# Patient Record
Sex: Female | Born: 1960 | Race: White | Hispanic: No | Marital: Married | Smoking: Current some day smoker
Health system: Southern US, Community
[De-identification: ages and names within clinical notes are randomized; demographics above are authoritative.]

## PROBLEM LIST (undated history)

## (undated) DIAGNOSIS — F32A Depression, unspecified: Secondary | ICD-10-CM

## (undated) DIAGNOSIS — M1990 Unspecified osteoarthritis, unspecified site: Secondary | ICD-10-CM

## (undated) DIAGNOSIS — T7840XA Allergy, unspecified, initial encounter: Secondary | ICD-10-CM

## (undated) DIAGNOSIS — Z01419 Encounter for gynecological examination (general) (routine) without abnormal findings: Secondary | ICD-10-CM

## (undated) DIAGNOSIS — G4700 Insomnia, unspecified: Secondary | ICD-10-CM

## (undated) DIAGNOSIS — K589 Irritable bowel syndrome without diarrhea: Secondary | ICD-10-CM

## (undated) DIAGNOSIS — F329 Major depressive disorder, single episode, unspecified: Secondary | ICD-10-CM

## (undated) DIAGNOSIS — M0600 Rheumatoid arthritis without rheumatoid factor, unspecified site: Secondary | ICD-10-CM

## (undated) HISTORY — PX: FOOT SURGERY: SHX648

## (undated) HISTORY — DX: Unspecified osteoarthritis, unspecified site: M19.90

## (undated) HISTORY — PX: MEDIAL PARTIAL KNEE REPLACEMENT: SHX5965

## (undated) HISTORY — DX: Irritable bowel syndrome, unspecified: K58.9

## (undated) HISTORY — DX: Depression, unspecified: F32.A

## (undated) HISTORY — PX: OTHER SURGICAL HISTORY: SHX169

## (undated) HISTORY — DX: Encounter for gynecological examination (general) (routine) without abnormal findings: Z01.419

## (undated) HISTORY — DX: Insomnia, unspecified: G47.00

## (undated) HISTORY — DX: Allergy, unspecified, initial encounter: T78.40XA

## (undated) HISTORY — PX: HERNIA REPAIR: SHX51

## (undated) HISTORY — DX: Rheumatoid arthritis without rheumatoid factor, unspecified site: M06.00

## (undated) HISTORY — DX: Major depressive disorder, single episode, unspecified: F32.9

## (undated) HISTORY — PX: DILATION AND CURETTAGE OF UTERUS: SHX78

---

## 1991-09-21 HISTORY — PX: TEMPOROMANDIBULAR JOINT SURGERY: SHX35

## 1998-09-29 ENCOUNTER — Inpatient Hospital Stay (HOSPITAL_COMMUNITY): Admission: AD | Admit: 1998-09-29 | Discharge: 1998-10-02 | Payer: Self-pay | Admitting: Obstetrics and Gynecology

## 1998-11-07 ENCOUNTER — Ambulatory Visit (HOSPITAL_COMMUNITY): Admission: RE | Admit: 1998-11-07 | Discharge: 1998-11-07 | Payer: Self-pay | Admitting: Obstetrics and Gynecology

## 1998-11-24 ENCOUNTER — Other Ambulatory Visit: Admission: RE | Admit: 1998-11-24 | Discharge: 1998-11-24 | Payer: Self-pay | Admitting: Pediatrics

## 2000-01-25 ENCOUNTER — Other Ambulatory Visit: Admission: RE | Admit: 2000-01-25 | Discharge: 2000-01-25 | Payer: Self-pay | Admitting: Obstetrics and Gynecology

## 2001-04-05 ENCOUNTER — Other Ambulatory Visit: Admission: RE | Admit: 2001-04-05 | Discharge: 2001-04-05 | Payer: Self-pay | Admitting: Obstetrics and Gynecology

## 2002-04-30 ENCOUNTER — Other Ambulatory Visit: Admission: RE | Admit: 2002-04-30 | Discharge: 2002-04-30 | Payer: Self-pay | Admitting: Obstetrics and Gynecology

## 2003-06-10 ENCOUNTER — Other Ambulatory Visit: Admission: RE | Admit: 2003-06-10 | Discharge: 2003-06-10 | Payer: Self-pay | Admitting: Obstetrics and Gynecology

## 2004-06-10 ENCOUNTER — Other Ambulatory Visit: Admission: RE | Admit: 2004-06-10 | Discharge: 2004-06-10 | Payer: Self-pay | Admitting: Obstetrics and Gynecology

## 2005-07-30 ENCOUNTER — Other Ambulatory Visit: Admission: RE | Admit: 2005-07-30 | Discharge: 2005-07-30 | Payer: Self-pay | Admitting: Obstetrics and Gynecology

## 2007-04-29 ENCOUNTER — Encounter: Payer: Self-pay | Admitting: Family Medicine

## 2007-05-29 ENCOUNTER — Ambulatory Visit: Payer: Self-pay | Admitting: Family Medicine

## 2007-05-29 DIAGNOSIS — M810 Age-related osteoporosis without current pathological fracture: Secondary | ICD-10-CM | POA: Insufficient documentation

## 2007-05-29 DIAGNOSIS — Z9189 Other specified personal risk factors, not elsewhere classified: Secondary | ICD-10-CM

## 2007-05-29 DIAGNOSIS — F418 Other specified anxiety disorders: Secondary | ICD-10-CM | POA: Insufficient documentation

## 2007-05-29 DIAGNOSIS — J309 Allergic rhinitis, unspecified: Secondary | ICD-10-CM

## 2007-05-29 DIAGNOSIS — G4700 Insomnia, unspecified: Secondary | ICD-10-CM | POA: Insufficient documentation

## 2007-06-16 ENCOUNTER — Ambulatory Visit: Payer: Self-pay | Admitting: Family Medicine

## 2007-06-16 DIAGNOSIS — J019 Acute sinusitis, unspecified: Secondary | ICD-10-CM

## 2007-07-07 ENCOUNTER — Ambulatory Visit: Payer: Self-pay | Admitting: Family Medicine

## 2007-10-20 ENCOUNTER — Encounter: Admission: RE | Admit: 2007-10-20 | Discharge: 2007-10-20 | Payer: Self-pay | Admitting: Obstetrics and Gynecology

## 2007-11-09 ENCOUNTER — Ambulatory Visit: Payer: Self-pay | Admitting: Family Medicine

## 2007-11-09 LAB — CONVERTED CEMR LAB
Bilirubin Urine: NEGATIVE
Blood in Urine, dipstick: NEGATIVE
Glucose, Urine, Semiquant: NEGATIVE
Ketones, urine, test strip: NEGATIVE
Nitrite: NEGATIVE
Specific Gravity, Urine: 1.025
Urobilinogen, UA: 0.2
WBC Urine, dipstick: NEGATIVE
pH: 7.5

## 2007-11-10 LAB — CONVERTED CEMR LAB
ALT: 21 units/L (ref 0–35)
AST: 24 units/L (ref 0–37)
Albumin: 4.2 g/dL (ref 3.5–5.2)
Alkaline Phosphatase: 59 units/L (ref 39–117)
BUN: 11 mg/dL (ref 6–23)
Basophils Absolute: 0 10*3/uL (ref 0.0–0.1)
Basophils Relative: 0.6 % (ref 0.0–1.0)
Bilirubin, Direct: 0.1 mg/dL (ref 0.0–0.3)
CO2: 28 meq/L (ref 19–32)
Calcium: 10 mg/dL (ref 8.4–10.5)
Chloride: 101 meq/L (ref 96–112)
Cholesterol: 201 mg/dL (ref 0–200)
Creatinine, Ser: 0.9 mg/dL (ref 0.4–1.2)
Direct LDL: 96.1 mg/dL
Eosinophils Absolute: 0.1 10*3/uL (ref 0.0–0.6)
Eosinophils Relative: 2.6 % (ref 0.0–5.0)
GFR calc Af Amer: 87 mL/min
GFR calc non Af Amer: 72 mL/min
Glucose, Bld: 89 mg/dL (ref 70–99)
HCT: 44 % (ref 36.0–46.0)
HDL: 81.2 mg/dL (ref >39.0)
Hemoglobin: 14.1 g/dL (ref 12.0–15.0)
Lymphocytes Relative: 32.2 % (ref 12.0–46.0)
MCHC: 32.1 g/dL (ref 30.0–36.0)
MCV: 93.2 fL (ref 78.0–100.0)
Monocytes Absolute: 0.4 10*3/uL (ref 0.2–0.7)
Monocytes Relative: 8.1 % (ref 3.0–11.0)
Neutro Abs: 2.8 10*3/uL (ref 1.4–7.7)
Neutrophils Relative %: 56.5 % (ref 43.0–77.0)
Platelets: 193 10*3/uL (ref 150–400)
Potassium: 5 meq/L (ref 3.5–5.1)
RBC: 4.72 M/uL (ref 3.87–5.11)
RDW: 12.1 % (ref 11.5–14.6)
Sodium: 137 meq/L (ref 135–145)
TSH: 2.11 microintl units/mL (ref 0.35–5.50)
Total Bilirubin: 0.7 mg/dL (ref 0.3–1.2)
Total CHOL/HDL Ratio: 2.5
Total Protein: 6.7 g/dL (ref 6.0–8.3)
Triglycerides: 75 mg/dL (ref 0–149)
VLDL: 15 mg/dL (ref 0–40)
WBC: 4.8 10*3/uL (ref 4.5–10.5)

## 2007-11-16 ENCOUNTER — Ambulatory Visit: Payer: Self-pay | Admitting: Family Medicine

## 2007-12-04 ENCOUNTER — Encounter (INDEPENDENT_AMBULATORY_CARE_PROVIDER_SITE_OTHER): Payer: Self-pay | Admitting: Orthopedic Surgery

## 2007-12-04 ENCOUNTER — Ambulatory Visit (HOSPITAL_BASED_OUTPATIENT_CLINIC_OR_DEPARTMENT_OTHER): Admission: RE | Admit: 2007-12-04 | Discharge: 2007-12-04 | Payer: Self-pay | Admitting: Orthopedic Surgery

## 2008-01-15 ENCOUNTER — Encounter: Payer: Self-pay | Admitting: Family Medicine

## 2008-03-18 ENCOUNTER — Telehealth: Payer: Self-pay | Admitting: Family Medicine

## 2008-03-28 ENCOUNTER — Telehealth: Payer: Self-pay | Admitting: Family Medicine

## 2008-05-08 ENCOUNTER — Telehealth: Payer: Self-pay | Admitting: Family Medicine

## 2008-05-20 ENCOUNTER — Telehealth: Payer: Self-pay | Admitting: Family Medicine

## 2008-07-30 ENCOUNTER — Telehealth: Payer: Self-pay | Admitting: Family Medicine

## 2009-01-01 ENCOUNTER — Encounter: Payer: Self-pay | Admitting: Family Medicine

## 2009-01-22 ENCOUNTER — Ambulatory Visit: Payer: Self-pay | Admitting: Family Medicine

## 2009-01-22 LAB — CONVERTED CEMR LAB
Bilirubin Urine: NEGATIVE
Blood in Urine, dipstick: NEGATIVE
Glucose, Urine, Semiquant: NEGATIVE
Ketones, urine, test strip: NEGATIVE
Nitrite: NEGATIVE
Protein, U semiquant: NEGATIVE
Specific Gravity, Urine: <1.005
Urobilinogen, UA: 0.2
WBC Urine, dipstick: NEGATIVE
pH: 5.5

## 2009-01-24 LAB — CONVERTED CEMR LAB
ALT: 15 units/L (ref 0–35)
AST: 25 units/L (ref 0–37)
Albumin: 4.1 g/dL (ref 3.5–5.2)
Alkaline Phosphatase: 89 units/L (ref 39–117)
BUN: 13 mg/dL (ref 6–23)
Basophils Absolute: 0.1 10*3/uL (ref 0.0–0.1)
Basophils Relative: 1.1 % (ref 0.0–3.0)
Bilirubin, Direct: 0 mg/dL (ref 0.0–0.3)
CO2: 31 meq/L (ref 19–32)
Calcium: 9.7 mg/dL (ref 8.4–10.5)
Chloride: 106 meq/L (ref 96–112)
Cholesterol: 231 mg/dL — ABNORMAL HIGH (ref 0–200)
Creatinine, Ser: 0.9 mg/dL (ref 0.4–1.2)
Direct LDL: 135.1 mg/dL
Eosinophils Absolute: 0.2 10*3/uL (ref 0.0–0.7)
Eosinophils Relative: 3.5 % (ref 0.0–5.0)
GFR calc non Af Amer: 71.82 mL/min (ref >60)
Glucose, Bld: 81 mg/dL (ref 70–99)
HCT: 40.2 % (ref 36.0–46.0)
HDL: 78.7 mg/dL (ref >39.00)
Hemoglobin: 13.9 g/dL (ref 12.0–15.0)
Lymphocytes Relative: 31.3 % (ref 12.0–46.0)
Lymphs Abs: 1.6 10*3/uL (ref 0.7–4.0)
MCHC: 34.6 g/dL (ref 30.0–36.0)
MCV: 90.8 fL (ref 78.0–100.0)
Monocytes Absolute: 0.5 10*3/uL (ref 0.1–1.0)
Monocytes Relative: 9.1 % (ref 3.0–12.0)
Neutro Abs: 2.6 10*3/uL (ref 1.4–7.7)
Neutrophils Relative %: 55 % (ref 43.0–77.0)
Platelets: 192 10*3/uL (ref 150.0–400.0)
Potassium: 4.1 meq/L (ref 3.5–5.1)
RBC: 4.42 M/uL (ref 3.87–5.11)
RDW: 12 % (ref 11.5–14.6)
Sodium: 145 meq/L (ref 135–145)
TSH: 2.8 microintl units/mL (ref 0.35–5.50)
Total Bilirubin: 0.7 mg/dL (ref 0.3–1.2)
Total CHOL/HDL Ratio: 3
Total Protein: 7.4 g/dL (ref 6.0–8.3)
Triglycerides: 93 mg/dL (ref 0.0–149.0)
VLDL: 18.6 mg/dL (ref 0.0–40.0)
WBC: 5 10*3/uL (ref 4.5–10.5)

## 2009-01-30 ENCOUNTER — Ambulatory Visit: Payer: Self-pay | Admitting: Family Medicine

## 2009-02-28 ENCOUNTER — Telehealth: Payer: Self-pay | Admitting: Family Medicine

## 2009-07-16 ENCOUNTER — Encounter: Payer: Self-pay | Admitting: Family Medicine

## 2009-08-04 ENCOUNTER — Telehealth: Payer: Self-pay | Admitting: Family Medicine

## 2009-09-20 LAB — HM MAMMOGRAPHY: HM Mammogram: NORMAL

## 2010-03-06 ENCOUNTER — Encounter (INDEPENDENT_AMBULATORY_CARE_PROVIDER_SITE_OTHER): Payer: Self-pay | Admitting: Emergency Medicine

## 2010-03-06 ENCOUNTER — Emergency Department (HOSPITAL_COMMUNITY): Admission: EM | Admit: 2010-03-06 | Discharge: 2010-03-06 | Payer: Self-pay | Admitting: Emergency Medicine

## 2010-03-06 ENCOUNTER — Ambulatory Visit: Payer: Self-pay | Admitting: Vascular Surgery

## 2010-03-06 ENCOUNTER — Encounter: Payer: Self-pay | Admitting: Family Medicine

## 2010-03-09 ENCOUNTER — Ambulatory Visit: Payer: Self-pay | Admitting: Family Medicine

## 2010-03-09 DIAGNOSIS — R4701 Aphasia: Secondary | ICD-10-CM | POA: Insufficient documentation

## 2010-03-09 DIAGNOSIS — G43809 Other migraine, not intractable, without status migrainosus: Secondary | ICD-10-CM | POA: Insufficient documentation

## 2010-04-01 ENCOUNTER — Telehealth: Payer: Self-pay | Admitting: Family Medicine

## 2010-05-02 ENCOUNTER — Ambulatory Visit: Payer: Self-pay | Admitting: Family Medicine

## 2010-05-02 DIAGNOSIS — IMO0001 Reserved for inherently not codable concepts without codable children: Secondary | ICD-10-CM

## 2010-05-11 ENCOUNTER — Ambulatory Visit: Payer: Self-pay | Admitting: Family Medicine

## 2010-05-11 LAB — CONVERTED CEMR LAB
Bilirubin Urine: NEGATIVE
Blood in Urine, dipstick: NEGATIVE
Glucose, Urine, Semiquant: NEGATIVE
Ketones, urine, test strip: NEGATIVE
Nitrite: NEGATIVE
Protein, U semiquant: NEGATIVE
Specific Gravity, Urine: 1.025
Urobilinogen, UA: 0.2
pH: 5.5

## 2010-05-13 LAB — CONVERTED CEMR LAB
ALT: 21 units/L (ref 0–35)
AST: 28 units/L (ref 0–37)
Albumin: 4.1 g/dL (ref 3.5–5.2)
Alkaline Phosphatase: 87 units/L (ref 39–117)
BUN: 17 mg/dL (ref 6–23)
Basophils Absolute: 0 10*3/uL (ref 0.0–0.1)
Basophils Relative: 0.5 % (ref 0.0–3.0)
Bilirubin, Direct: 0.1 mg/dL (ref 0.0–0.3)
CO2: 29 meq/L (ref 19–32)
Calcium: 9.4 mg/dL (ref 8.4–10.5)
Chloride: 103 meq/L (ref 96–112)
Cholesterol: 232 mg/dL — ABNORMAL HIGH (ref 0–200)
Creatinine, Ser: 0.8 mg/dL (ref 0.4–1.2)
Direct LDL: 139.3 mg/dL
Eosinophils Absolute: 0.1 10*3/uL (ref 0.0–0.7)
Eosinophils Relative: 1.8 % (ref 0.0–5.0)
GFR calc non Af Amer: 81.81 mL/min (ref >60)
Glucose, Bld: 78 mg/dL (ref 70–99)
HCT: 38.7 % (ref 36.0–46.0)
HDL: 80.9 mg/dL (ref >39.00)
Hemoglobin: 13 g/dL (ref 12.0–15.0)
Lymphocytes Relative: 30.6 % (ref 12.0–46.0)
Lymphs Abs: 1.7 10*3/uL (ref 0.7–4.0)
MCHC: 33.6 g/dL (ref 30.0–36.0)
MCV: 88 fL (ref 78.0–100.0)
Monocytes Absolute: 0.4 10*3/uL (ref 0.1–1.0)
Monocytes Relative: 7.4 % (ref 3.0–12.0)
Neutro Abs: 3.3 10*3/uL (ref 1.4–7.7)
Neutrophils Relative %: 59.7 % (ref 43.0–77.0)
Platelets: 195 10*3/uL (ref 150.0–400.0)
Potassium: 4 meq/L (ref 3.5–5.1)
RBC: 4.4 M/uL (ref 3.87–5.11)
RDW: 14.9 % — ABNORMAL HIGH (ref 11.5–14.6)
Sodium: 140 meq/L (ref 135–145)
TSH: 4.15 microintl units/mL (ref 0.35–5.50)
Total Bilirubin: 0.5 mg/dL (ref 0.3–1.2)
Total CHOL/HDL Ratio: 3
Total Protein: 6.7 g/dL (ref 6.0–8.3)
Triglycerides: 82 mg/dL (ref 0.0–149.0)
VLDL: 16.4 mg/dL (ref 0.0–40.0)
WBC: 5.5 10*3/uL (ref 4.5–10.5)

## 2010-05-18 ENCOUNTER — Ambulatory Visit: Payer: Self-pay | Admitting: Family Medicine

## 2010-09-30 ENCOUNTER — Telehealth: Payer: Self-pay | Admitting: Family Medicine

## 2010-10-11 ENCOUNTER — Encounter: Payer: Self-pay | Admitting: Obstetrics and Gynecology

## 2010-10-16 ENCOUNTER — Telehealth: Payer: Self-pay | Admitting: Family Medicine

## 2010-10-22 NOTE — Progress Notes (Signed)
Summary: rx evista   Phone Note Call from Patient   Caller: Patient Call For: Nelwyn Salisbury MD Summary of Call: Needs Evista refilled.......states she and Dr. Clent Ridges talked about him prescribing all meds. [facility] Initial call taken by: Lynann Beaver CMA AAMA,  September 30, 2010 9:03 AM  Follow-up for Phone Call        call this in please, #30 with 11 rf  Follow-up by: Nelwyn Salisbury MD,  October 01, 2010 8:48 AM  Additional Follow-up for Phone Call Additional follow up Details #1::        done  Additional Follow-up by: Pura Spice, RN,  October 01, 2010 1:13 PM    New/Updated Medications: EVISTA 60 MG  TABS (RALOXIFENE HCL) 1 by mouth once daily Prescriptions: EVISTA 60 MG  TABS (RALOXIFENE HCL) 1 by mouth once daily  #30 x 11   Entered by:   Pura Spice, RN   Authorized by:   Nelwyn Salisbury MD   Signed by:   Pura Spice, RN on 10/01/2010   Method used:   Electronically to        [facility] (retail)       [address]       [place], [place]  [address]       Ph: [phone]       Fax: [phone]   RxID:   [id number]

## 2010-10-22 NOTE — Letter (Signed)
Summary: [facility]  [facility]   Imported By: Maryln Gottron 07/21/2009 14:48:32  _____________________________________________________________________  External Attachment:    Type:   Image     Comment:   External Document

## 2010-10-22 NOTE — Progress Notes (Signed)
Summary: rx temazepam  Phone Note From Pharmacy   Caller: [facility] Summary of Call: rx  temazepam  Initial call taken by: Pura Spice, RN,  October 16, 2010 4:07 PM  Follow-up for Phone Call        call in #30 with 5 rf Follow-up by: Nelwyn Salisbury MD,  October 16, 2010 5:00 PM  Additional Follow-up for Phone Call Additional follow up Details #1::        done  Additional Follow-up by: Pura Spice, RN,  October 16, 2010 5:13 PM    New/Updated Medications: RESTORIL 30 MG CAPS (TEMAZEPAM) 1 capsule at bedtime if needed Prescriptions: RESTORIL 30 MG CAPS (TEMAZEPAM) 1 capsule at bedtime if needed  #30 x 5   Entered by:   Pura Spice, RN   Authorized by:   Nelwyn Salisbury MD   Signed by:   Pura Spice, RN on 10/16/2010   Method used:   Printed then faxed to ...       [facility] (retail)       [address]       [place], [place]  [address]       Ph: [phone]       Fax: [phone]   RxID:   [id number]

## 2010-10-22 NOTE — Assessment & Plan Note (Signed)
Summary: seen in ER this weekend with slurred speech/had CT/MRI/dm   Vital Signs:  Patient profile:   50 year old female Weight:      133 pounds BP sitting:   82 / 58  (left arm) Cuff size:   regular  Vitals Entered By: Raechel Ache, RN (March 09, 2010 2:21 PM) CC: ER f/u for aphasia.    History of Present Illness: Here to follow up an episode of slurred speech. She had the onset of slurred speech and difficulty putting her thoughts into words on 03-06-10 after getting out of her car. She had some slight flashing lights and zigzag lines in her vision as well. No shaking or rigidity, no nausea or SOB or chest pains. She did have a mild left temporal HA with this. This lasted 10 minutes and went away. She was taken to the ER and had a full workup of labs, head CT, and brain MRI/MRA. All this was normal. She has felt fine ever since. They suggested she start taking a baby aspirin daily, and she has. She has a hx of migraines with visual auras, but she usually does not have much of a HA with these. She has been under a lot of stress lately with family issues.   Allergies: 1)  ! Vicodin  Past History:  Past Medical History: Reviewed history from 11/16/2007 and no changes required. Allergic rhinitis Depression Osteoporosis (last DEXA 2006) per Dr. Rana Snare insomnia                                                                                                                                                                      constipation (had a normal colonoscopy 10-05 per Dr. Kinnie Scales) normal treadmill and ECHO 4-08 per Dr. Jacinto Halim  Past Surgical History: Reviewed history from 05/29/2007 and no changes required. TMJ 1993 Hernia, umbilical Tummy Tuck (per Dr. Aurelio Jew)  Family History: Reviewed history from 05/29/2007 and no changes required. Family History of Alcoholism/Addiction Family History of Arthritis Family History of CAD Female 1st degree relative <60 Family History  Depression Family History Diabetes 1st degree relative Family History High cholesterol Family History of Stroke F 1st degree relative <60 Family History Thyroid disease sister has migraines  Review of Systems  The patient denies anorexia, fever, weight loss, weight gain, vision loss, decreased hearing, hoarseness, chest pain, syncope, dyspnea on exertion, peripheral edema, prolonged cough, headaches, hemoptysis, abdominal pain, melena, hematochezia, severe indigestion/heartburn, hematuria, incontinence, genital sores, muscle weakness, suspicious skin lesions, transient blindness, difficulty walking, depression, unusual weight change, abnormal bleeding, enlarged lymph nodes, angioedema, breast masses, and testicular masses.    Physical Exam  General:  Well-developed,well-nourished,in no acute distress; alert,appropriate and cooperative throughout examination Head:  Normocephalic and atraumatic without obvious  abnormalities. No apparent alopecia or balding. Eyes:  No corneal or conjunctival inflammation noted. EOMI. Perrla. Funduscopic exam benign, without hemorrhages, exudates or papilledema. Vision grossly normal. Ears:  External ear exam shows no significant lesions or deformities.  Otoscopic examination reveals clear canals, tympanic membranes are intact bilaterally without bulging, retraction, inflammation or discharge. Hearing is grossly normal bilaterally. Nose:  External nasal examination shows no deformity or inflammation. Nasal mucosa are pink and moist without lesions or exudates. Mouth:  Oral mucosa and oropharynx without lesions or exudates.  Teeth in good repair. Neck:  No deformities, masses, or tenderness noted. No carotid bruits Lungs:  Normal respiratory effort, chest expands symmetrically. Lungs are clear to auscultation, no crackles or wheezes. Heart:  Normal rate and regular rhythm. S1 and S2 normal without gallop, murmur, click, rub or other extra sounds. Neurologic:  No  cranial nerve deficits noted. Station and gait are normal. Plantar reflexes are down-going bilaterally. DTRs are symmetrical throughout. Sensory, motor and coordinative functions appear intact.   Impression & Recommendations:  Problem # 1:  MIGRAINE VARIANT (ICD-346.20)  Her updated medication list for this problem includes:    Aspirin 81 Mg Tbec (Aspirin) ..... Once daily  Problem # 2:  APHASIA (ICD-784.3)  Complete Medication List: 1)  Lexapro 20 Mg Tabs (Escitalopram oxalate) .Marland Kitchen.. 1 by mouth once daily 2)  Miralax Powd (Polyethylene glycol 3350) .... As needed 3)  Evista 60 Mg Tabs (Raloxifene hcl) .Marland Kitchen.. 1 by mouth once daily 4)  Alprazolam 1 Mg Tb24 (Alprazolam) .Marland Kitchen.. 1-2 q hs 5)  Temazepam 30 Mg Caps (Temazepam) .Marland Kitchen.. 1 at bedtime prn 6)  Aspirin 81 Mg Tbec (Aspirin) .... Once daily  Patient Instructions: 1)  I agree with baby aspirin daily. I do not think that further workup is needed at this point. She is to get plenty of sleep, drink lots of fluids, and avoid getting overheated. If she has another episode she will let us know.

## 2010-10-22 NOTE — Progress Notes (Signed)
Summary: not sleeping  Phone Note Call from Patient   Caller: Patient Call For: Dr. Clent Ridges Summary of Call: Pt is still not sleeping through the night on Temazepam 30 mg. Please call her and let her know what you suggest? [phone] Initial call taken by: Lynann Beaver CMA,  March 18, 2008 12:33 PM  Follow-up for Phone Call        call in Lunesta 3 mg at bedtime , #30 with 5 rf Follow-up by: Nelwyn Salisbury MD,  March 18, 2008 5:33 PM  Additional Follow-up for Phone Call Additional follow up Details #1::        Phone Call Completed, Rx Called In Additional Follow-up by: Alfred Levins, CMA,  March 18, 2008 5:35 PM    New/Updated Medications: LUNESTA 3 MG TABS (ESZOPICLONE) one by mouth at bedtime as needed   Prescriptions: LUNESTA 3 MG TABS (ESZOPICLONE) one by mouth at bedtime as needed  #30 x 5   Entered by:   Alfred Levins, CMA   Authorized by:   Nelwyn Salisbury MD   Signed by:   Alfred Levins, CMA on 03/18/2008   Method used:   Faxed to ...       [facility]       [address]       [place], [place]  [address]       Ph: [phone]       Fax: [phone]   RxID:   [id number]

## 2010-10-22 NOTE — Assessment & Plan Note (Signed)
Summary: COLD/COUGH/NJR   Vital Signs:  Patient Profile:   49 Years Old Female Height:     62.5 inches (158.75 cm) Weight:      116 pounds Temp:     98.1 degrees F oral Pulse rate:   83 / minute Pulse rhythm:   regular BP sitting:   112 / 76  (left arm)  Vitals Entered By: Alfred Levins, CMA (June 16, 2007 10:37 AM)                 Chief Complaint:  cough and nasal congestion x 1wk.  History of Present Illness: For one week has had sinus pressure, PND, ST, and dry cough. Took a Zpack she had at home with no results.  Current Allergies: ! VICODIN     Review of Systems      See HPI   Physical Exam  General:     Well-developed,well-nourished,in no acute distress; alert,appropriate and cooperative throughout examination Head:     Normocephalic and atraumatic without obvious abnormalities. No apparent alopecia or balding. Eyes:     No corneal or conjunctival inflammation noted. EOMI. Perrla. Funduscopic exam benign, without hemorrhages, exudates or papilledema. Vision grossly normal. Ears:     External ear exam shows no significant lesions or deformities.  Otoscopic examination reveals clear canals, tympanic membranes are intact bilaterally without bulging, retraction, inflammation or discharge. Hearing is grossly normal bilaterally. Nose:     External nasal examination shows no deformity or inflammation. Nasal mucosa are pink and moist without lesions or exudates. Mouth:     Oral mucosa and oropharynx without lesions or exudates.  Teeth in good repair. Neck:     No deformities, masses, or tenderness noted. Lungs:     Normal respiratory effort, chest expands symmetrically. Lungs are clear to auscultation, no crackles or wheezes.    Impression & Recommendations:  Problem # 1:  SINUSITIS, ACUTE NOS (ICD-461.9)  Her updated medication list for this problem includes:    Augmentin 875-125 Mg Tabs (Amoxicillin-pot clavulanate) .Marland Kitchen..Marland Kitchen Two times a day   Complete  Medication List: 1)  Actonel 35 Mg Tabs (Risedronate sodium) .Marland Kitchen.. 1 by mouth weekly 2)  Ambien 10 Mg Tabs (Zolpidem tartrate) .... As needed 3)  Lexapro 20 Mg Tabs (Escitalopram oxalate) .Marland Kitchen.. 1 by mouth once daily 4)  Miralax Powd (Polyethylene glycol 3350) .... As needed 5)  Augmentin 875-125 Mg Tabs (Amoxicillin-pot clavulanate) .... Two times a day   Patient Instructions: 1)  Please schedule a follow-up appointment as needed.    Prescriptions: AUGMENTIN 875-125 MG  TABS (AMOXICILLIN-POT CLAVULANATE) two times a day  #14 x 0   Entered and Authorized by:   Nelwyn Salisbury MD   Signed by:   Nelwyn Salisbury MD on 06/16/2007   Method used:   Electronically sent to ...       [facility]       [address]       [place], [place]  [address]       Ph: [phone]       Fax: [phone]   RxID:   [id number]  ]

## 2010-10-22 NOTE — Progress Notes (Signed)
Summary: refill temazepam  Phone Note From Pharmacy   Caller: [facility] Call For: Sarah Ford  Reason for Call: Needs renewal Summary of Call: refill temazepam 30mg  1 by mouth at bedtime as needed Initial call taken by: Alfred Levins, CMA,  July 30, 2008 8:21 AM  Follow-up for Phone Call        please check this out. As of August she was taking Alprazolam (Xanax) at bedtime, not Temazepam Follow-up by: Nelwyn Salisbury MD,  July 30, 2008 8:33 AM  Additional Follow-up for Phone Call Additional follow up Details #1::        according to pharmacist she is taking both since feb. temazepam  Additional Follow-up by: Alfred Levins, CMA,  August 01, 2008 11:29 AM    Additional Follow-up for Phone Call Additional follow up Details #2::    call in #30 of 30 mg at bedtime ,with 5 rf Follow-up by: Nelwyn Salisbury MD,  August 01, 2008 12:26 PM  Additional Follow-up for Phone Call Additional follow up Details #3:: Details for Additional Follow-up Action Taken: rx called in Additional Follow-up by: Alfred Levins, CMA,  August 01, 2008 12:41 PM  New/Updated Medications: TEMAZEPAM 30 MG CAPS (TEMAZEPAM) 1 at bedtime prn   Prescriptions: TEMAZEPAM 30 MG CAPS (TEMAZEPAM) 1 at bedtime prn  #30 x 5   Entered by:   Alfred Levins, CMA   Authorized by:   Nelwyn Salisbury MD   Signed by:   Alfred Levins, CMA on 08/01/2008   Method used:   Telephoned to ...       [facility] (retail)       [address]       [place], [place]  [address]       Ph: [phone]       Fax: [phone]   RxID:   [id number]

## 2010-10-22 NOTE — Assessment & Plan Note (Signed)
Summary: cpx/cjr   Vital Signs:  Patient profile:   50 year old female Weight:      136 pounds BMI:     24.76 BP sitting:   100 / 64  (left arm) Cuff size:   regular  Vitals Entered By: Raechel Ache, RN (May 18, 2010 9:21 AM) CC: CPX, labs done. Sees gyn.   History of Present Illness: 50 yr old female for a cpx. She is doing well in general. She finds Alprazolam better for falling asleep then temazepam, so she wants to switch back to this.   Allergies: 1)  ! Vicodin  Past History:  Past Medical History: Allergic rhinitis Depression Osteoporosis (last DEXA 2006) per Dr. Rana Snare insomnia                                                                                                                                                                      constipation normal treadmill and ECHO 4-08 per Dr. Jacinto Halim sees Dr/ Rana Snare for GYN exams  Past Surgical History: TMJ 1993 Hernia, umbilical Tummy Tuck (per Dr. Aurelio Jew) colonoscopy in 05-2009 per Dr. Kinnie Scales, normal, repeat in 5 yrs  Family History: Reviewed history from 03/09/2010 and no changes required. Family History of Alcoholism/Addiction Family History of Arthritis Family History of CAD Female 1st degree relative <60 Family History Depression Family History Diabetes 1st degree relative Family History High cholesterol Family History of Stroke F 1st degree relative <60 Family History Thyroid disease sister has migraines  Social History: Reviewed history from 05/29/2007 and no changes required. 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    Physical Exam  General:  Well-developed,well-nourished,in no acute distress; alert,appropriate and cooperative throughout examination Head:  Normocephalic and atraumatic without obvious abnormalities. No apparent alopecia or balding. Eyes:  No corneal or conjunctival inflammation noted. EOMI. Perrla. Funduscopic exam benign, without hemorrhages, exudates or papilledema. Vision grossly normal. Ears:  External ear exam shows no significant lesions or deformities.  Otoscopic examination reveals clear canals, tympanic membranes are intact bilaterally without bulging, retraction, inflammation or discharge. Hearing is grossly normal bilaterally. Nose:  External nasal examination shows no deformity or inflammation. Nasal mucosa are pink and moist without lesions or exudates. Mouth:  Oral mucosa and oropharynx without lesions or exudates.  Teeth in good repair. Neck:  No deformities, masses, or tenderness noted. Chest Wall:  No deformities, masses, or tenderness noted. Lungs:  Normal respiratory effort, chest expands symmetrically. Lungs are clear to auscultation, no crackles or wheezes. Heart:  Normal rate and regular rhythm. S1 and S2 normal without gallop, murmur, click, rub or other extra sounds. Abdomen:  Bowel sounds positive,abdomen soft and non-tender without masses, organomegaly or hernias noted. Msk:  No deformity or scoliosis noted of thoracic or lumbar spine.   Pulses:  R and L carotid,radial,femoral,dorsalis pedis and posterior tibial pulses are full and equal bilaterally Extremities:  No clubbing, cyanosis, edema, or deformity noted with normal full range of motion of all joints.   Neurologic:  No cranial nerve deficits noted. Station and gait are normal. Plantar reflexes are down-going bilaterally. DTRs are  symmetrical throughout. Sensory, motor and coordinative functions appear intact. Skin:  Intact without suspicious lesions or rashes Cervical Nodes:  No lymphadenopathy noted Axillary Nodes:  No palpable lymphadenopathy Inguinal Nodes:  No significant adenopathy Psych:  Cognition and judgment appear intact. Alert and cooperative with normal attention span and concentration. No apparent delusions, illusions, hallucinations   Impression & Recommendations:  Problem # 1:  PHYSICAL EXAMINATION (ICD-V70.0)  Complete Medication List: 1)  Lexapro 20 Mg Tabs (Escitalopram oxalate) .Marland Kitchen.. 1 by mouth once daily 2)  Miralax Powd (Polyethylene glycol 3350) .... As needed 3)  Evista 60 Mg Tabs (Raloxifene hcl) .Marland Kitchen.. 1 by mouth once daily 4)  Alprazolam 1 Mg Tb24 (Alprazolam) .Marland Kitchen.. 1-2 at bedtime 5)  Aspirin 81 Mg Tbec (Aspirin) .... Once daily 6)  Doxycycline Hyclate 100 Mg Tabs (Doxycycline hyclate) .Marland Kitchen.. 1 by mouth two times a day for 10 days  Patient Instructions: 1)  Switch to Alprazolam at bedtime.  2)  Please schedule a follow-up appointment in 6 months .  Prescriptions: ALPRAZOLAM 1 MG  TB24 (ALPRAZOLAM) 1-2 at bedtime  #60 x 5   Entered and Authorized by:   Nelwyn Salisbury MD   Signed by:   Nelwyn Salisbury MD on 05/18/2010   Method used:   Print then Give to Patient   RxID:   [id number] LEXAPRO 20 MG TABS (ESCITALOPRAM OXALATE) 1 by mouth once daily  #30 x 11   Entered and Authorized by:   Nelwyn Salisbury MD   Signed by:   Nelwyn Salisbury MD on 05/18/2010   Method used:   Print then Give to Patient   RxID:   [id number]    Preventive Care Screening  Mammogram:    Date:  09/20/2009    Results:  normal

## 2010-10-22 NOTE — Consult Note (Signed)
Summary: Dr Osie Bond note  Dr Osie Bond note   Imported By: Kassie Mends 01/19/2008 08:20:35  _____________________________________________________________________  External Attachment:    Type:   Image     Comment:   Dr Osie Bond note

## 2010-10-22 NOTE — Assessment & Plan Note (Signed)
Summary: sinus infection/ccm   Vital Signs:  Patient Profile:   50 Years Old Female Height:     62.5 inches (158.75 cm) Weight:      108.5 pounds Temp:     98.4 degrees F Pulse rate:   76 / minute BP sitting:   98 / 70  Vitals Entered By: Sindy Guadeloupe RN (July 07, 2007 4:14 PM)                 Chief Complaint:  sinus probs continue.  History of Present Illness: Has had a sinus infection for the past 3 weeks, despite a Zpack and then a course of Augmentin. Has sinus pressure, HA, PND, ST, blowing out green mucus from nose, and dry cough. On Claritin and fluids.  Current Allergies (reviewed today): ! VICODIN     Review of Systems      See HPI   Physical Exam  General:     Well-developed,well-nourished,in no acute distress; alert,appropriate and cooperative throughout examination Head:     Normocephalic and atraumatic without obvious abnormalities. No apparent alopecia or balding. Eyes:     No corneal or conjunctival inflammation noted. EOMI. Perrla. Funduscopic exam benign, without hemorrhages, exudates or papilledema. Vision grossly normal. Ears:     External ear exam shows no significant lesions or deformities.  Otoscopic examination reveals clear canals, tympanic membranes are intact bilaterally without bulging, retraction, inflammation or discharge. Hearing is grossly normal bilaterally. Nose:     External nasal examination shows no deformity or inflammation. Nasal mucosa are pink and moist without lesions or exudates. Mouth:     Oral mucosa and oropharynx without lesions or exudates.  Teeth in good repair. Neck:     No deformities, masses, or tenderness noted. Lungs:     Normal respiratory effort, chest expands symmetrically. Lungs are clear to auscultation, no crackles or wheezes.    Impression & Recommendations:  Problem # 1:  SINUSITIS, ACUTE NOS (ICD-461.9)  Her updated medication list for this problem includes:    Levaquin 750 Mg Tabs  (Levofloxacin) ..... Once daily   Complete Medication List: 1)  Actonel 35 Mg Tabs (Risedronate sodium) .Marland Kitchen.. 1 by mouth weekly 2)  Ambien 10 Mg Tabs (Zolpidem tartrate) .... As needed 3)  Lexapro 20 Mg Tabs (Escitalopram oxalate) .Marland Kitchen.. 1 by mouth once daily 4)  Miralax Powd (Polyethylene glycol 3350) .... As needed 5)  Caltrate 600+d 600-400 Mg-unit Tabs (Calcium carbonate-vitamin d) .... One or two once daily 6)  Levaquin 750 Mg Tabs (Levofloxacin) .... Once daily   Patient Instructions: 1)  stop claritin, try Mucinex D two times a day . 2)  Please schedule a follow-up appointment as needed.    Prescriptions: LEVAQUIN 750 MG  TABS (LEVOFLOXACIN) once daily  #14 x 0   Entered and Authorized by:   Nelwyn Salisbury MD   Signed by:   Nelwyn Salisbury MD on 07/07/2007   Method used:   Electronically sent to ...       [facility]       [address]       [place], [place]  [address]       Ph: [phone]       Fax: [phone]   RxID:   [id number]  ]  Influenza Immunization History:    Influenza # 1:  Historical (07/05/2007)

## 2010-10-22 NOTE — Letter (Signed)
Summary: [facility]  [facility]   Imported By: Maryln Gottron 01/06/2009 13:04:52  _____________________________________________________________________  External Attachment:    Type:   Image     Comment:   External Document

## 2010-10-22 NOTE — Assessment & Plan Note (Signed)
Summary: NEW PT TO EST/JNL   Vital Signs:  Patient Profile:   50 Years Old Female Height:     62.5 inches (158.75 cm) Weight:      116 pounds (52.73 kg) Temp:     98.2 degrees F (36.78 degrees C) oral Pulse rate:   72 / minute Pulse rhythm:   regular BP sitting:   112 / 72  (left arm)  Vitals Entered By: Alfred Levins, CMA (May 29, 2007 10:27 AM)                 Chief Complaint:  ESTABLISH.  History of Present Illness: 50 yr old female here to establish with [place]. In April was donating blood and was told she had an irregular heartbeat. Saw Dr. Jacinto Halim, who peformed a treadmill and an ECHO which were all normal. Told to follow up with a primary care doctor. She occasionally feels a slight flutter, but never SOB or chest pain. Is very active, walks 20 miles a week. Uses a fair amount of caffeine. Otherwise feels fine. Sees Dr. Rana Snare regularly for gyn care.  Current Allergies: ! VICODIN  Past Medical History:    Reviewed history and no changes required:       Allergic rhinitis       Depression       Osteoporosis (last DEXA 2006) per Dr. Rana Snare       insomnia                                                                                                                                                                      constipation (had a normal colonoscopy 10-05 per Dr. Kinnie Scales)  Past Surgical History:    Reviewed history and no changes required:       TMJ 1993       Hernia, umbilical       Tummy Tuck (per Dr. 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 1 by mouth once daily   Problem # 3:  OSTEOPOROSIS (ICD-733.00) Assessment: Unchanged  Her updated medication list for this problem includes:    Actonel 35 Mg Tabs (Risedronate sodium) .Marland Kitchen... 1 by mouth weekly   Problem # 4:  INSOMNIA, CHRONIC (ICD-307.42) Assessment: Unchanged  Complete Medication List: 1)  Actonel 35 Mg Tabs (Risedronate sodium) .Marland Kitchen.. 1 by mouth weekly 2)  Ambien 10 Mg Tabs (Zolpidem tartrate) .... As needed 3)  Lexapro 20 Mg Tabs (Escitalopram oxalate) .Marland Kitchen.. 1 by mouth once daily 4)  Miralax Powd (Polyethylene glycol 3350) .... As needed   Patient Instructions: 1)  Please schedule a follow-up appointment as needed. Get records of recent cardiac workup

## 2010-10-22 NOTE — Progress Notes (Signed)
Summary: refill temazepam  Phone Note From Pharmacy   Caller: [facility] [address] Call For: Taylor Levick  Summary of Call: refill temazepam 30mg  1 by mouth at bedtime Initial call taken by: Alfred Levins, CMA,  August 04, 2009 5:13 PM  Follow-up for Phone Call        call in #30 with 5 rf Follow-up by: Nelwyn Salisbury MD,  August 05, 2009 10:09 AM  Additional Follow-up for Phone Call Additional follow up Details #1::        Phone call completed, Pharmacist called Additional Follow-up by: Alfred Levins, CMA,  August 05, 2009 10:16 AM    Prescriptions: TEMAZEPAM 30 MG CAPS (TEMAZEPAM) 1 at bedtime prn  #30 x 5   Entered by:   Alfred Levins, CMA   Authorized by:   Nelwyn Salisbury MD   Signed by:   Alfred Levins, CMA on 08/05/2009   Method used:   Telephoned to ...       [facility] [address] (retail)       [address]       [place], [place]  [address]       Ph: [phone]       Fax: [phone]   RxID:   [id number]

## 2010-10-22 NOTE — Progress Notes (Signed)
Summary: refill Temazepam  Phone Note Refill Request Message from:  Fax from Pharmacy on April 01, 2010 11:27 AM  Refills Requested: Medication #1:  TEMAZEPAM 30 MG CAPS 1 at bedtime prn   Dosage confirmed as above?Dosage Confirmed   Supply Requested: 3 months  Method Requested: Fax to Local Pharmacy Initial call taken by: Raechel Ache, RN,  April 01, 2010 11:27 AM Caller: [facility] [address]  Follow-up for Phone Call        call in #30 with 5 rf Follow-up by: Nelwyn Salisbury MD,  April 01, 2010 6:02 PM  Additional Follow-up for Phone Call Additional follow up Details #1::        Rx faxed to pharmacy Additional Follow-up by: Raechel Ache, RN,  April 02, 2010 8:16 AM    Prescriptions: TEMAZEPAM 30 MG CAPS (TEMAZEPAM) 1 at bedtime prn  #30 x 5   Entered by:   Raechel Ache, RN   Authorized by:   Nelwyn Salisbury MD   Signed by:   Raechel Ache, RN on 04/02/2010   Method used:   Historical   RxID:   [id number]

## 2010-10-22 NOTE — Progress Notes (Signed)
Summary: refill hydrocodone  Phone Note From Pharmacy Call back at [phone]   Caller: [facility] [address] Call For: Sarah Ford  Summary of Call: refill hydrocodone Initial call taken by: Alfred Levins, CMA,  May 08, 2008 9:29 AM  Follow-up for Phone Call        (1) what is this for?, and (2) her chart says she is allergic to Vicodin? Follow-up by: Nelwyn Salisbury MD,  May 08, 2008 1:09 PM  Additional Follow-up for Phone Call Additional follow up Details #1::        I was mistaken it is xanax 1mg  Additional Follow-up by: Alfred Levins, CMA,  May 08, 2008 1:47 PM    Additional Follow-up for Phone Call Additional follow up Details #2::    ok, call in #60 with 5 rf Follow-up by: Nelwyn Salisbury MD,  May 08, 2008 1:48 PM  Additional Follow-up for Phone Call Additional follow up Details #3:: Details for Additional Follow-up Action Taken: rx called in Additional Follow-up by: Alfred Levins, CMA,  May 08, 2008 1:51 PM    Prescriptions: ALPRAZOLAM 1 MG  TB24 (ALPRAZOLAM) 1-2 q HS  #60 x 5   Entered by:   Alfred Levins, CMA   Authorized by:   Nelwyn Salisbury MD   Signed by:   Alfred Levins, CMA on 05/08/2008   Method used:   Faxed to ...       [facility]       [address]       [place], [place]  [address]       Ph: [phone]       Fax: [phone]   RxID:   [id number]

## 2010-10-22 NOTE — Letter (Signed)
Summary: medical records  medical records   Imported By: Kassie Mends 06/05/2007 12:48:20  _____________________________________________________________________  External Attachment:    Type:   Image     Comment:   medical records

## 2010-10-22 NOTE — Progress Notes (Signed)
Summary: Alfonso Patten giving her nightmares   [facility]TC[facility] 03/29/2008  Phone Note Call from Patient   Caller: Patient Call For: Dr. Clent Ridges Summary of Call: Alfonso Patten is giving her nighmares.  Wants something else.  Is out of town and needs this pharmacy called. [phone] Pt number is [phone] Initial call taken by: Lynann Beaver CMA,  March 28, 2008 10:57 AM  Follow-up for Phone Call        call in Xanax 1  mg, take 1 or 2 tabs at bedtime , #60 with no rf. Follow-up by: Nelwyn Salisbury MD,  March 28, 2008 1:21 PM    New/Updated Medications: ALPRAZOLAM 1 MG  TB24 (ALPRAZOLAM) 1-2 q HS   Prescriptions: ALPRAZOLAM 1 MG  TB24 (ALPRAZOLAM) 1-2 q HS  #60 x 0   Entered by:   Lynann Beaver CMA   Authorized by:   Nelwyn Salisbury MD   Signed by:   Lynann Beaver CMA on 03/29/2008   Method used:   Telephoned to ...       [facility]       [address]       [place], [place]  [address]       Ph: [phone]       Fax: [phone]   RxID:   [id number]  Pt. notifed.

## 2010-10-22 NOTE — Assessment & Plan Note (Signed)
Summary: CPX/MHF   Vital Signs:  Patient profile:   50 year old female Height:      62.25 inches Weight:      126 pounds BMI:     22.94 Temp:     98.1 degrees F oral Pulse rate:   67 / minute BP sitting:   122 / 84  (left arm) Cuff size:   regular  Vitals Entered By: Alfred Levins, CMA (Jan 30, 2009 9:25 AM) CC: cpx, no pap   History of Present Illness: 50 yr old female for cpx. Feels good, no concerns.   Allergies: 1)  ! Vicodin  Past History:  Past Medical History:    Reviewed history from 11/16/2007 and no changes required:    Allergic rhinitis    Depression    Osteoporosis (last DEXA 2006) per Dr. Rana Snare    insomnia                                                                                                                                                                      constipation (had a normal colonoscopy 10-05 per Dr. Kinnie Scales)    normal treadmill and ECHO 4-08 per Dr. Jacinto Halim  Past Surgical History:    Reviewed history from 05/29/2007 and no changes required:    TMJ 1993    Hernia, umbilical    Tummy Tuck (per Dr. 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       Married       Current Smoker       Alcohol use-yes  Review of Systems  The patient denies anorexia, fever, weight loss, weight gain, vision loss, decreased hearing, hoarseness, chest pain, syncope, dyspnea on exertion, peripheral edema, prolonged cough, headaches, hemoptysis, abdominal pain,  melena, hematochezia, severe indigestion/heartburn, hematuria, incontinence, genital sores, muscle weakness, suspicious skin lesions, transient blindness, difficulty walking, depression, unusual weight change, abnormal bleeding, enlarged lymph nodes, angioedema, breast masses, and testicular masses.    Physical Exam  General:  Well-developed,well-nourished,in no acute distress; alert,appropriate and cooperative throughout  examination Head:  Normocephalic and atraumatic without obvious abnormalities. No apparent alopecia or balding. Eyes:  No corneal or conjunctival inflammation noted. EOMI. Perrla. Funduscopic exam benign, without hemorrhages, exudates or papilledema. Vision grossly normal. Ears:  External ear exam shows no significant lesions or deformities.  Otoscopic examination reveals clear canals, tympanic membranes are intact bilaterally without bulging, retraction, inflammation or discharge. Hearing is grossly normal bilaterally. Nose:  External nasal examination shows no deformity or inflammation. Nasal mucosa are pink and moist without lesions or exudates. Mouth:  Oral mucosa and oropharynx without lesions or exudates.  Teeth in good repair. Neck:  No deformities, masses, or tenderness noted. Chest Wall:  No deformities, masses, or tenderness noted. Lungs:  Normal respiratory effort, chest expands symmetrically. Lungs are clear to auscultation, no crackles or wheezes. Heart:  Normal rate and regular rhythm. S1 and S2 normal without gallop, murmur, click, rub or other extra sounds. Abdomen:  Bowel sounds positive,abdomen soft and non-tender without masses, organomegaly or hernias noted. Msk:  No deformity or scoliosis noted of thoracic or lumbar spine.   Pulses:  R and L carotid,radial,femoral,dorsalis pedis and posterior tibial pulses are full and equal bilaterally Extremities:  No clubbing, cyanosis, edema, or deformity noted with normal full range of motion of all joints.   Neurologic:   No cranial nerve deficits noted. Station and gait are normal. Plantar reflexes are down-going bilaterally. DTRs are symmetrical throughout. Sensory, motor and coordinative functions appear intact. Skin:  Intact without suspicious lesions or rashes Cervical Nodes:  No lymphadenopathy noted Axillary Nodes:  No palpable lymphadenopathy Inguinal Nodes:  No significant adenopathy Psych:  Cognition and judgment appear intact. Alert and cooperative with normal attention span and concentration. No apparent delusions, illusions, hallucinations   Impression & Recommendations:  Problem # 1:  PHYSICAL EXAMINATION (ICD-V70.0)  Complete Medication List: 1)  Lexapro 20 Mg Tabs (Escitalopram oxalate) .Marland Kitchen.. 1 by mouth once daily 2)  Miralax Powd (Polyethylene glycol 3350) .... As needed 3)  Evista 60 Mg Tabs (Raloxifene hcl) .Marland Kitchen.. 1 by mouth once daily 4)  Alprazolam 1 Mg Tb24 (Alprazolam) .Marland Kitchen.. 1-2 q hs 5)  Temazepam 30 Mg Caps (Temazepam) .Marland Kitchen.. 1 at bedtime prn  Patient Instructions: 1)  Please schedule a follow-up appointment in 6 months .

## 2010-10-22 NOTE — Assessment & Plan Note (Signed)
Summary: SPIDER BITE   Vital Signs:  Patient profile:   50 year old female Weight:      136 pounds BMI:     24.76 Temp:     97.8 degrees F oral Pulse rate:   62 / minute BP sitting:   100 / 62  Vitals Entered By: Lamar Sprinkles, CMA (May 02, 2010 12:28 PM) CC: Red circle w/bruising on right forearm x 3 days. Also c/o h/a x 2 days. -unsure of last tetanus ? > 7 yrs ago.    History of Present Illness: [place]  thinks she may have a spider bite  round circle with bruising  has been there for 2 days  also a dull headache for 2 days  no tick bites known  dull headache  no fever no rash   no itch or burn or throv-- really is not bothering her   Current Medications (verified): 1)  Lexapro 20 Mg Tabs (Escitalopram Oxalate) .Marland Kitchen.. 1 By Mouth Once Daily 2)  Miralax   Powd (Polyethylene Glycol 3350) .... As Needed 3)  Evista 60 Mg  Tabs (Raloxifene Hcl) .Marland Kitchen.. 1 By Mouth Once Daily 4)  Alprazolam 1 Mg  Tb24 (Alprazolam) .Marland Kitchen.. 1-2 Q Hs 5)  Temazepam 30 Mg Caps (Temazepam) .Marland Kitchen.. 1 At Bedtime Prn 6)  Aspirin 81 Mg Tbec (Aspirin) .... Once Daily  Allergies (verified): 1)  ! Vicodin  Review of Systems General:  Denies chills, fatigue, fever, loss of appetite, and malaise. Eyes:  Denies blurring, discharge, and eye irritation. CV:  Denies chest pain or discomfort and palpitations. Resp:  Denies cough and wheezing. GI:  Denies abdominal pain. MS:  Denies joint pain, joint redness, and joint swelling. Derm:  Complains of lesion(s); denies itching and rash. Neuro:  Complains of headaches. Heme:  Denies bleeding and enlarge lymph nodes.  Physical Exam  General:  Well-developed,well-nourished,in no acute distress; alert,appropriate and cooperative throughout examination Head:  normocephalic, atraumatic, and no abnormalities observed.   Eyes:  vision grossly intact, pupils equal, pupils round, and pupils reactive to light.  no conjunctival pallor, injection  or icterus  Mouth:  pharynx pink and moist.   Neck:  supple with full rom and no masses or thyromegally, no JVD or carotid bruit  Lungs:  Normal respiratory effort, chest expands symmetrically. Lungs are clear to auscultation, no crackles or wheezes. Heart:  Normal rate and regular rhythm. S1 and S2 normal without gallop, murmur, click, rub or other extra sounds. Msk:  no acute joint changes  Extremities:  No clubbing, cyanosis, edema, or deformity noted with normal full range of motion of all joints.   Skin:  R lower arm 1-2 cm lesion with slt redness/induration and ecchymosis with some central clearing  no skin interruption or drainage  non tender no streaking Cervical Nodes:  No lymphadenopathy noted Psych:  normal affect, talkative and pleasant    Impression & Recommendations:  Problem # 1:  INSECT BITE, LOWER ARM (ICD-913.4) with some redness (1 cm) , bruising and central clearing diff incl spider bite (incl brown recluse) or also tick bite (no tick seen) will cover with doxycycline and close obs inst to call / follow up if any inc in size of lesion or other changes or fever  Complete Medication List: 1)  Lexapro 20 Mg Tabs (Escitalopram oxalate) .Marland Kitchen.. 1 by mouth once daily 2)  Miralax Powd (Polyethylene glycol 3350) .... As needed 3)  Evista 60 Mg Tabs (Raloxifene hcl) .Marland KitchenMarland KitchenMarland Kitchen  1 by mouth once daily 4)  Alprazolam 1 Mg Tb24 (Alprazolam) .Marland Kitchen.. 1-2 q hs 5)  Temazepam 30 Mg Caps (Temazepam) .Marland Kitchen.. 1 at bedtime prn 6)  Aspirin 81 Mg Tbec (Aspirin) .... Once daily 7)  Doxycycline Hyclate 100 Mg Tabs (Doxycycline hyclate) .Marland Kitchen.. 1 by mouth two times a day for 10 days  Patient Instructions: 1)  keep eye on the bite - update your doctor if it gets bigger or more red or painful or if you get a fever 2)  take the doxycycline as directed - I sent to your pharmacy  3)  if any problems let us know  Prescriptions: DOXYCYCLINE HYCLATE 100 MG TABS (DOXYCYCLINE HYCLATE) 1 by mouth two times a day for  10 days  #20 x 0   Entered and Authorized by:   Judith Part MD   Signed by:   Judith Part MD on 05/02/2010   Method used:   Electronically to        [facility] [address] (retail)       [address]       [place], [place]  [address]       Ph: [phone]       Fax: [phone]   RxID:   [id number]

## 2010-10-22 NOTE — Progress Notes (Signed)
Summary: refill temazepam  Phone Note From Pharmacy   [facility]er: [facility] [address] Call For: Sarah Ford  Summary of Call: refill temazepam 30mg  1 by mouth at bedtime as needed  Initial call taken by: Alfred Levins, CMA,  February 28, 2009 10:08 AM  Follow-up for Phone Call        call in #30 with 5rf Follow-up by: Nelwyn Salisbury MD,  February 28, 2009 12:42 PM  Additional Follow-up for Phone Call Additional follow up Details #1::        Phone call completed, Pharmacist called Additional Follow-up by: Alfred Levins, CMA,  February 28, 2009 1:39 PM      Prescriptions: TEMAZEPAM 30 MG CAPS (TEMAZEPAM) 1 at bedtime prn  #30 x 5   Entered by:   Alfred Levins, CMA   Authorized by:   Nelwyn Salisbury MD   Signed by:   Alfred Levins, CMA on 02/28/2009   Method used:   Telephoned to ...       [facility] [address] (retail)       [address]       [place], [place]  [address]       Ph: [phone]       Fax: [phone]   RxID:   [id number]

## 2010-10-22 NOTE — Assessment & Plan Note (Signed)
Summary: CPX/JLS   Vital Signs:  Patient Profile:   50 Years Old Female Height:     62.5 inches (158.75 cm) Weight:      117 pounds Temp:     98.6 degrees F oral Pulse rate:   84 / minute Pulse rhythm:   regular BP sitting:   84 / 54  (left arm) Cuff size:   regular  Vitals Entered By: Alfred Levins, CMA (November 16, 2007 9:13 AM)                 Chief Complaint:  cpz and no pap.  History of Present Illness: 50 yr old female for cpx. Sees Dr. Rana Snare for gyn exams. Doing well in general. Stays active. Recently saw Dr. Rana Snare who switched her from Actonel to Evista for osteoporosis. Apparently she had a little bone loss even while on Actonel. Wants to try another sleep med besides Ambien because it causes a lot of nighttime snacking and she has gained some weight. Depression is stable.    Current Allergies (reviewed today): ! VICODIN  Past Medical History:    Reviewed history from 05/29/2007 and no changes required:       Allergic rhinitis       Depression       Osteoporosis (last DEXA 2006) per Dr. Rana Snare       insomnia                                                                                                                                                                      constipation (had a normal colonoscopy 10-05 per Dr. Kinnie Scales)       normal treadmill and ECHO 4-08 per Dr. Jacinto Halim  Past Surgical History:    Reviewed history from 05/29/2007 and no changes required:       TMJ 1993       Hernia, umbilical       Tummy Tuck (per Dr. 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     Physical Exam  General:     Well-developed,well-nourished,in no acute distress; alert,appropriate and cooperative throughout examination Head:     Normocephalic and atraumatic without obvious abnormalities. No apparent alopecia or balding. Eyes:     No corneal or conjunctival inflammation noted. EOMI. Perrla. Funduscopic exam benign, without hemorrhages, exudates or papilledema. Vision grossly normal. Ears:     External ear exam shows no significant lesions or deformities.  Otoscopic examination reveals clear canals, tympanic membranes are intact bilaterally without bulging, retraction, inflammation or discharge. Hearing is grossly normal bilaterally. Nose:     External nasal examination shows no deformity or inflammation. Nasal mucosa are pink and moist without lesions or exudates. Mouth:     Oral mucosa and oropharynx without lesions or exudates.  Teeth in good repair. Neck:     No deformities, masses, or tenderness noted. Chest Wall:     No deformities, masses, or tenderness noted. Lungs:     Normal respiratory effort, chest expands symmetrically. Lungs are clear to auscultation, no crackles or wheezes. Heart:     Normal rate and regular rhythm. S1 and S2 normal without  gallop, murmur, click, rub or other extra sounds. Abdomen:     Bowel sounds positive,abdomen soft and non-tender without masses, organomegaly or hernias noted. Msk:     No deformity or scoliosis noted of thoracic or lumbar spine.   Pulses:     R and L carotid,radial,femoral,dorsalis pedis and posterior tibial pulses are full and equal bilaterally Extremities:     No clubbing, cyanosis, edema, or deformity noted with normal full range of motion of all joints.   Neurologic:     No cranial nerve deficits noted. Station and gait are normal. Plantar reflexes are down-going bilaterally. DTRs are symmetrical throughout. Sensory, motor and coordinative functions appear intact. Skin:     Intact without suspicious lesions or rashes Cervical Nodes:     No lymphadenopathy noted Axillary Nodes:     No palpable lymphadenopathy Inguinal Nodes:     No significant adenopathy Psych:     Cognition and judgment appear intact. Alert and cooperative with normal attention span and concentration. No apparent delusions, illusions, hallucinations    Impression & Recommendations:  Problem # 1:  PHYSICAL EXAMINATION (ICD-V70.0)  Complete Medication List: 1)  Lexapro 20 Mg Tabs (Escitalopram oxalate) .Marland Kitchen.. 1 by mouth once daily 2)  Miralax Powd (Polyethylene glycol 3350) .... As needed 3)  Caltrate 600+d 600-400 Mg-unit Tabs (Calcium carbonate-vitamin d) .... One or two once daily 4)  Evista 60 Mg Tabs (Raloxifene hcl) .Marland Kitchen.. 1 by mouth once daily 5)  Temazepam 30 Mg Caps (Temazepam) .Marland Kitchen.. 1at bedtime as needed   Patient Instructions: 1)  Please schedule a follow-up appointment in 1 year.    Prescriptions: TEMAZEPAM 30 MG  CAPS (TEMAZEPAM) 1at bedtime as needed  #30 x 5   Entered and Authorized by:   Nelwyn Salisbury MD   Signed by:   Nelwyn Salisbury MD on 11/16/2007   Method  used:   Print then Give to Patient   RxID:   [id number]  ]

## 2010-10-22 NOTE — Progress Notes (Signed)
Summary: LMTCB 8-31  Phone Note From Pharmacy   Caller: [facility] [address] Call For: Shelby Peltz  Summary of Call: refill temazepam 30mg  1 by mouth at bedtime as needed Initial call taken by: Alfred Levins, CMA,  May 20, 2008 10:00 AM  Follow-up for Phone Call        She tried Temazepam before and told me it did not work. That's why I gave her Xanax instead last time. Which does she want?  Follow-up by: Nelwyn Salisbury MD,  May 20, 2008 10:21 AM  Additional Follow-up for Phone Call Additional follow up Details #1::        [facility] for patient at [phone] Rudy Jew, RN  May 20, 2008 3:28 PM [facility]  Lynann Beaver CMA  May 21, 2008 9:42 AM      Additional Follow-up for Phone Call Additional follow up Details #2::    Spoke to pt, and she is using the Xanax......does not need Temazepam. Follow-up by: Lynann Beaver CMA,  May 21, 2008 10:35 AM

## 2010-10-22 NOTE — Letter (Signed)
Summary: patient history  patient history   Imported By: Kassie Mends 06/07/2007 10:26:38  _____________________________________________________________________  External Attachment:    Type:   Image     Comment:   patient history

## 2010-12-03 ENCOUNTER — Encounter: Payer: Self-pay | Admitting: Family Medicine

## 2010-12-06 LAB — BASIC METABOLIC PANEL
BUN: 13 mg/dL (ref 6–23)
CO2: 28 mEq/L (ref 19–32)
Calcium: 9.5 mg/dL (ref 8.4–10.5)
Chloride: 104 mEq/L (ref 96–112)
Creatinine, Ser: 1.18 mg/dL (ref 0.4–1.2)
GFR calc Af Amer: 60 mL/min — ABNORMAL LOW (ref >60)
GFR calc non Af Amer: 49 mL/min — ABNORMAL LOW (ref >60)
Glucose, Bld: 118 mg/dL — ABNORMAL HIGH (ref 70–99)
Potassium: 3.7 mEq/L (ref 3.5–5.1)
Sodium: 138 mEq/L (ref 135–145)

## 2010-12-06 LAB — CBC
HCT: 38.4 % (ref 36.0–46.0)
Hemoglobin: 12.9 g/dL (ref 12.0–15.0)
MCHC: 33.7 g/dL (ref 30.0–36.0)
MCV: 85.4 fL (ref 78.0–100.0)
Platelets: 197 10*3/uL (ref 150–400)
RBC: 4.5 MIL/uL (ref 3.87–5.11)
RDW: 15.9 % — ABNORMAL HIGH (ref 11.5–15.5)
WBC: 6.5 10*3/uL (ref 4.0–10.5)

## 2010-12-10 ENCOUNTER — Ambulatory Visit (INDEPENDENT_AMBULATORY_CARE_PROVIDER_SITE_OTHER): Payer: BC Managed Care – PPO | Admitting: Family Medicine

## 2010-12-10 ENCOUNTER — Encounter: Payer: Self-pay | Admitting: Family Medicine

## 2010-12-10 VITALS — BP 120/80 | HR 80 | Temp 98.7°F | Wt 129.0 lb

## 2010-12-10 DIAGNOSIS — R51 Headache: Secondary | ICD-10-CM

## 2010-12-10 DIAGNOSIS — R519 Headache, unspecified: Secondary | ICD-10-CM

## 2010-12-10 MED ORDER — SUMATRIPTAN SUCCINATE 100 MG PO TABS: 100.0000 mg | ORAL_TABLET | Freq: Once | ORAL | Status: DC | PRN

## 2010-12-10 NOTE — Progress Notes (Signed)
  Subjective:    Patient ID: Sarah Ford, female    DOB: 1960-11-12, 50 y.o.   MRN: [id number]  HPI Here to discuss her frequent hadaches and other spells she is having. She has had typical migraines off and on for years, but lately these have changed a bit. For the past month she has had frequent mild dull temporal HAs that can be on either side of the head. She does get some flashing lights at times but not  always. No severe HAs. She never has nausea with them. Then last week she had an entire day of a mild dull HA that kept her home from work. The odd part of this is she has almost no memory of that day, and her family members say she seemed to act normally that day. No recent head trauma. She had a normal head CT and a normal brain MRI with MRA in June of 2011. She had a normal eye exam with Dr. Burgess Estelle at [facility] earlier this week.   Review of Systems  Constitutional: Negative.   Eyes: Positive for visual disturbance. Negative for photophobia, pain, discharge, redness and itching.  Respiratory: Negative.   Cardiovascular: Negative.   Neurological: Positive for headaches. Negative for dizziness, tremors, seizures, syncope, facial asymmetry, speech difficulty, weakness, light-headedness and numbness.  Psychiatric/Behavioral: Negative.        Objective:   Physical Exam  Constitutional: She is oriented to person, place, and time. She appears well-developed and well-nourished.  HENT:  Head: Normocephalic and atraumatic.  Right Ear: External ear normal.  Left Ear: External ear normal.  Nose: Nose normal.  Mouth/Throat: Oropharynx is clear and moist.  Eyes: Conjunctivae and EOM are normal. Pupils are equal, round, and reactive to light.  Neck: Normal range of motion. Neck supple.  Neurological: She is alert and oriented to person, place, and time. She has normal reflexes. She displays normal reflexes. No cranial nerve deficit. She exhibits normal muscle tone. Coordination  abnormal.          Assessment & Plan:  These still seem to be migraines, but the recent memory problems are concerning. Try Sumatriptan. We will refer her to Neurology.

## 2010-12-18 ENCOUNTER — Other Ambulatory Visit: Payer: Self-pay

## 2010-12-18 MED ORDER — ALPRAZOLAM ER 1 MG PO TB24: ORAL_TABLET | ORAL | Status: DC

## 2010-12-18 NOTE — Telephone Encounter (Signed)
Call in Xanax XR 1 mg , to take 1-2 tabs qhs, #60 with 5 rf

## 2010-12-18 NOTE — Telephone Encounter (Signed)
Requesting refill alprazolam XR  1 mg

## 2010-12-18 NOTE — Telephone Encounter (Signed)
rx called [facility]

## 2011-02-01 ENCOUNTER — Other Ambulatory Visit: Payer: Self-pay | Admitting: Neurology

## 2011-02-01 DIAGNOSIS — G43909 Migraine, unspecified, not intractable, without status migrainosus: Secondary | ICD-10-CM

## 2011-02-02 NOTE — Op Note (Signed)
Sarah Ford, Sarah Ford                 ACCOUNT NO.:  [id number]   MEDICAL RECORD NO.:  [id number]          PATIENT TYPE:  AMB   LOCATION:  DSC                          FACILITY:  [facility]   PHYSICIAN:  Eulas Post, MD    DATE OF BIRTH:  13-Apr-1961   DATE OF PROCEDURE:  12/04/2007  DATE OF DISCHARGE:                               OPERATIVE REPORT   PREOPERATIVE DIAGNOSIS:  Right great toe metatarsal osteochondroma.   POSTOPERATIVE DIAGNOSIS:  Right great toe metatarsal osteochondroma.   OPERATIVE PROCEDURE:  Excision of osteochondroma, right great  metatarsal.   ANESTHESIA:  General.   TOURNIQUET TIME:  33 minutes.   ESTIMATED BLOOD LOSS:  Minimal.   OPERATIVE SPECIMENS:  Osteochondroma, pieces of bone.   PREOPERATIVE INDICATIONS:  Sarah Ford is a 50 year old woman who  complained of dorsal pain with shoe wear.  She had tried conservative  management measures; however, she continued to have pain.  She had an  MRI which demonstrated an osteochondroma.  She wished to have this  removed surgically.  The risks, benefits and alternatives to operative  intervention were discussed with her including but not limited to the  risks of infection, bleeding, nerve injury, recurrence of the growth,  recurrence of foot pain, cardiopulmonary complications, among others,  and she was willing to proceed.   OPERATIVE FINDINGS:  There was a dorsal fibular exostosis at the distal  edge of the great metatarsal.  This did not involve the joint.  There  was no ganglion found.   OPERATIVE PROCEDURE:  The patient was brought to the operating room and  placed in a supine position.  General anesthesia was administered.  The  right lower extremity was prepped and draped in the usual sterile  fashion.  Incision was made on the fibular aspect of the distal portion  of the great metatarsal.  Care was taken to protect the neurovascular  structures in the area.  The bone was exposed.  Exploration was  performed around the joint in order to see if there was a ganglion;  however, there was no ganglion associated with either the tendon or the  joint.  We then found what appeared to be a reasonably large exostosis.  This was removed in entirety.  The bone edges were  smoothened with a rasp.  We irrigated the wounds copiously and closed  the tissue with nylon sutures.  The patient was awakened and returned to  the PACU in stable and satisfactory condition.  Sterile dressings were  applied.  The patient tolerated the procedure well and there were no  complications.      Eulas Post, MD  Electronically Signed     JPL/MEDQ  D:  12/04/2007  T:  12/04/2007  Job:  [id number]

## 2011-02-05 ENCOUNTER — Ambulatory Visit
Admission: RE | Admit: 2011-02-05 | Discharge: 2011-02-05 | Disposition: A | Discharge status: Home / Self Care | Payer: BC Managed Care – PPO | Source: Ambulatory Visit | Attending: Neurology | Admitting: Neurology

## 2011-02-05 DIAGNOSIS — G43909 Migraine, unspecified, not intractable, without status migrainosus: Secondary | ICD-10-CM

## 2011-02-05 MED ORDER — IOHEXOL 350 MG/ML SOLN
100.0000 mL | Freq: Once | INTRAVENOUS | Status: AC | PRN
  Administered 2011-02-05: 100 mL via INTRAVENOUS

## 2011-02-08 ENCOUNTER — Other Ambulatory Visit (HOSPITAL_COMMUNITY): Payer: Self-pay | Admitting: Neurology

## 2011-02-08 DIAGNOSIS — R42 Dizziness and giddiness: Secondary | ICD-10-CM

## 2011-02-09 ENCOUNTER — Other Ambulatory Visit (HOSPITAL_COMMUNITY): Payer: BC Managed Care – PPO | Admitting: Radiology

## 2011-02-19 ENCOUNTER — Ambulatory Visit (HOSPITAL_COMMUNITY): Payer: BC Managed Care – PPO | Attending: Neurology | Admitting: Radiology

## 2011-02-19 DIAGNOSIS — G43909 Migraine, unspecified, not intractable, without status migrainosus: Secondary | ICD-10-CM | POA: Insufficient documentation

## 2011-02-19 DIAGNOSIS — R42 Dizziness and giddiness: Secondary | ICD-10-CM

## 2011-02-19 DIAGNOSIS — I059 Rheumatic mitral valve disease, unspecified: Secondary | ICD-10-CM | POA: Insufficient documentation

## 2011-02-19 DIAGNOSIS — I079 Rheumatic tricuspid valve disease, unspecified: Secondary | ICD-10-CM | POA: Insufficient documentation

## 2011-02-22 ENCOUNTER — Encounter (HOSPITAL_COMMUNITY): Payer: Self-pay | Admitting: Neurology

## 2011-03-28 ENCOUNTER — Other Ambulatory Visit: Payer: Self-pay | Admitting: Family Medicine

## 2011-03-29 NOTE — Telephone Encounter (Signed)
Pt last seen here on 12/10/10 and script was last filled on 12/03/10.

## 2011-03-30 NOTE — Telephone Encounter (Signed)
Can refill x 1   Other refills per Dr Clent Ridges

## 2011-04-28 ENCOUNTER — Other Ambulatory Visit: Payer: Self-pay | Admitting: Internal Medicine

## 2011-04-29 NOTE — Telephone Encounter (Signed)
Call in #30 with 5 rf 

## 2011-04-30 NOTE — Telephone Encounter (Signed)
rx has been called into pharmacy.

## 2011-05-06 ENCOUNTER — Other Ambulatory Visit: Payer: Self-pay | Admitting: Family Medicine

## 2011-05-13 ENCOUNTER — Telehealth: Payer: Self-pay | Admitting: Family Medicine

## 2011-05-13 NOTE — Telephone Encounter (Signed)
Pt is having a hard time sleeping and the medicine she is on temazepam (RESTORIL) 30 MG capsule  Is not helping her fall asleep. Pt would like to know if there was something else she could take to help her fall asleep.

## 2011-05-14 ENCOUNTER — Telehealth: Payer: Self-pay | Admitting: Family Medicine

## 2011-05-14 MED ORDER — ZOLPIDEM TARTRATE 10 MG PO TABS: 10.0000 mg | ORAL_TABLET | Freq: Every evening | ORAL | Status: AC | PRN

## 2011-05-14 NOTE — Telephone Encounter (Signed)
Try Ambien instead. Call in Zolpidem 10 mg qhs, #30 with 5 rf

## 2011-05-14 NOTE — Telephone Encounter (Signed)
Script called in and left message for pt. 

## 2011-05-14 NOTE — Telephone Encounter (Signed)
Opened in error

## 2011-05-14 NOTE — Telephone Encounter (Signed)
Pt called back - left message on triage line. Wants to know if we did call anything in. Please call pt and make her aware. Thanks. [phone]

## 2011-06-14 LAB — POCT HEMOGLOBIN-HEMACUE: Hemoglobin: 13.4

## 2011-07-12 ENCOUNTER — Telehealth: Payer: Self-pay | Admitting: Family Medicine

## 2011-07-12 NOTE — Telephone Encounter (Signed)
Call in #60 with 5 rf 

## 2011-07-12 NOTE — Telephone Encounter (Signed)
Refill request for Alprazolam ER 1 mg take 1-2 po qhs and pt last here on 12/10/10

## 2011-07-12 NOTE — Telephone Encounter (Signed)
Refill request for Alprazolam ER 1 mg take 1-2 po qhs and pt last here on 12/10/10 

## 2011-07-13 MED ORDER — ALPRAZOLAM ER 1 MG PO TB24: ORAL_TABLET | ORAL | Status: DC

## 2011-07-13 NOTE — Telephone Encounter (Signed)
Script called in

## 2011-07-26 ENCOUNTER — Ambulatory Visit (INDEPENDENT_AMBULATORY_CARE_PROVIDER_SITE_OTHER): Payer: BC Managed Care – PPO | Admitting: Family Medicine

## 2011-07-26 DIAGNOSIS — Z0000 Encounter for general adult medical examination without abnormal findings: Secondary | ICD-10-CM

## 2011-07-26 DIAGNOSIS — Z23 Encounter for immunization: Secondary | ICD-10-CM

## 2011-07-27 ENCOUNTER — Ambulatory Visit: Payer: BC Managed Care – PPO | Admitting: Family Medicine

## 2011-08-11 ENCOUNTER — Other Ambulatory Visit: Payer: Self-pay | Admitting: Family Medicine

## 2011-08-14 ENCOUNTER — Other Ambulatory Visit: Payer: Self-pay | Admitting: Family Medicine

## 2011-08-17 ENCOUNTER — Telehealth: Payer: Self-pay

## 2011-08-17 NOTE — Telephone Encounter (Signed)
Pt states she would like to up her Lexapro dosage and would also like to have some valium to use prn.  Pls advise.

## 2011-08-17 NOTE — Telephone Encounter (Signed)
Script sent e-scribe 

## 2011-08-18 MED ORDER — ESCITALOPRAM OXALATE 20 MG PO TABS: 20.0000 mg | ORAL_TABLET | Freq: Two times a day (BID) | ORAL | Status: DC

## 2011-08-18 NOTE — Telephone Encounter (Signed)
rx sent and patient is aware 

## 2011-08-18 NOTE — Telephone Encounter (Signed)
Increase Lexapro to 20 mg bid. Call in #60 with 11 rf. She is already on Xanax so we cannot give her Valium

## 2011-09-20 ENCOUNTER — Telehealth: Payer: Self-pay | Admitting: *Deleted

## 2011-09-20 NOTE — Telephone Encounter (Signed)
(  Triage Voicemail)  Pt c/o back pain and requesting rx for muscle relaxer and diflucan, [place]

## 2011-09-22 MED ORDER — FLUCONAZOLE 150 MG PO TABS: 150.0000 mg | ORAL_TABLET | Freq: Once | ORAL | Status: AC

## 2011-09-22 MED ORDER — CYCLOBENZAPRINE HCL 10 MG PO TABS: 10.0000 mg | ORAL_TABLET | Freq: Three times a day (TID) | ORAL | Status: AC | PRN

## 2011-09-22 NOTE — Telephone Encounter (Signed)
Scripts sent e-scribe and left voice message for pt. 

## 2011-09-22 NOTE — Telephone Encounter (Signed)
Call in Flexeril 10 mg tid prn spasm, #60 with 5 rf, also Diflucan 150 mg to take one prn, #10 with 5 rf

## 2011-10-16 ENCOUNTER — Other Ambulatory Visit: Payer: Self-pay | Admitting: Family Medicine

## 2011-10-18 ENCOUNTER — Other Ambulatory Visit: Payer: Self-pay

## 2011-10-18 MED ORDER — RALOXIFENE HCL 60 MG PO TABS: 60.0000 mg | ORAL_TABLET | Freq: Every day | ORAL | Status: DC

## 2011-10-18 NOTE — Telephone Encounter (Signed)
Refill for one year 

## 2011-10-18 NOTE — Telephone Encounter (Signed)
Last OV 12/10/10. Last filled 09/16/11

## 2011-11-25 ENCOUNTER — Telehealth: Payer: Self-pay

## 2011-11-25 NOTE — Telephone Encounter (Signed)
Rx request for temazepam 30 mg last filled on 10/13/11 and zolpidem tartrate 10 mg last filled on 10/16/11.  Pt last seen 12/10/10. Pls advise.

## 2011-11-26 NOTE — Telephone Encounter (Signed)
Last August we switched her from Temazepam to Ambien, so I took the temazepam off her list. Pleas call in a 6 month supply of Ambien

## 2011-11-27 ENCOUNTER — Other Ambulatory Visit: Payer: Self-pay | Admitting: Family Medicine

## 2011-11-29 NOTE — Telephone Encounter (Signed)
Last seen 11/11/2010 Last written : temazepam 04/28/11 # 30  5Rf Zolpidem 05/14/11 # 30 5Rf Please advise

## 2011-12-01 MED ORDER — ZOLPIDEM TARTRATE 10 MG PO TABS: 10.0000 mg | ORAL_TABLET | Freq: Every evening | ORAL | Status: DC | PRN

## 2011-12-01 NOTE — Telephone Encounter (Signed)
Call in the Ambien as noted in my previous note. The temazepam was cancelled

## 2011-12-01 NOTE — Telephone Encounter (Signed)
Script called in for Ambien and spoke with pt.

## 2011-12-18 ENCOUNTER — Other Ambulatory Visit: Payer: Self-pay | Admitting: Family Medicine

## 2011-12-22 NOTE — Telephone Encounter (Signed)
Pt last ov was over a year ago .   Needs to make appt with Dr Clent Ridges. Can disp 14 ' pills in the meantime. In Dr Carver Fila absence.

## 2011-12-23 NOTE — Telephone Encounter (Signed)
Spoke with pt and she will wait until Dr. Clent Ridges gets back in the office to approve this medication.

## 2011-12-27 NOTE — Telephone Encounter (Signed)
She takes this bid, call in #60 with 5 rf

## 2011-12-30 NOTE — Telephone Encounter (Signed)
Call in #60 with 5 rf 

## 2011-12-31 NOTE — Telephone Encounter (Signed)
Script called in

## 2012-01-11 ENCOUNTER — Other Ambulatory Visit: Payer: Self-pay | Admitting: Family Medicine

## 2012-01-13 NOTE — Telephone Encounter (Signed)
NO, I cannot fill this because i gave her 6 months of Zolpidem last month

## 2012-02-01 ENCOUNTER — Telehealth: Payer: Self-pay | Admitting: *Deleted

## 2012-02-01 MED ORDER — TEMAZEPAM 30 MG PO CAPS: 30.0000 mg | ORAL_CAPSULE | Freq: Every evening | ORAL | Status: DC | PRN

## 2012-02-01 NOTE — Telephone Encounter (Signed)
Left message on machine For pt to notify her this was done

## 2012-02-01 NOTE — Telephone Encounter (Signed)
Wants to go back on temazepam- didn't like ambien- caused dreams--would like tamazepam called to [facility]

## 2012-02-01 NOTE — Telephone Encounter (Signed)
Call her pharmacy to cancel all refills for Ambien. Call in Temazepam 30 mg qhs, #30 with 5 rf

## 2012-05-09 ENCOUNTER — Other Ambulatory Visit: Payer: BC Managed Care – PPO

## 2012-05-16 ENCOUNTER — Encounter: Payer: BC Managed Care – PPO | Admitting: Family Medicine

## 2012-06-28 ENCOUNTER — Other Ambulatory Visit (INDEPENDENT_AMBULATORY_CARE_PROVIDER_SITE_OTHER): Payer: BC Managed Care – PPO

## 2012-06-28 DIAGNOSIS — Z0000 Encounter for general adult medical examination without abnormal findings: Secondary | ICD-10-CM

## 2012-06-28 LAB — HEPATIC FUNCTION PANEL
ALT: 16 U/L (ref 0–35)
AST: 21 U/L (ref 0–37)
Albumin: 4 g/dL (ref 3.5–5.2)
Alkaline Phosphatase: 87 U/L (ref 39–117)
Bilirubin, Direct: 0.1 mg/dL (ref 0.0–0.3)
Total Bilirubin: 0.5 mg/dL (ref 0.3–1.2)
Total Protein: 7.4 g/dL (ref 6.0–8.3)

## 2012-06-28 LAB — POCT URINALYSIS DIPSTICK
Bilirubin, UA: NEGATIVE
Blood, UA: NEGATIVE
Glucose, UA: NEGATIVE
Ketones, UA: NEGATIVE
Leukocytes, UA: NEGATIVE
Nitrite, UA: NEGATIVE
Protein, UA: NEGATIVE
Spec Grav, UA: 1.015
Urobilinogen, UA: 0.2
pH, UA: 5.5

## 2012-06-28 LAB — CBC WITH DIFFERENTIAL/PLATELET
Basophils Absolute: 0 10*3/uL (ref 0.0–0.1)
Basophils Relative: 0.7 % (ref 0.0–3.0)
Eosinophils Absolute: 0 10*3/uL (ref 0.0–0.7)
Eosinophils Relative: 0.5 % (ref 0.0–5.0)
HCT: 33.6 % — ABNORMAL LOW (ref 36.0–46.0)
Hemoglobin: 10.8 g/dL — ABNORMAL LOW (ref 12.0–15.0)
Lymphocytes Relative: 32.4 % (ref 12.0–46.0)
Lymphs Abs: 1.9 10*3/uL (ref 0.7–4.0)
MCHC: 32.1 g/dL (ref 30.0–36.0)
MCV: 84.5 fl (ref 78.0–100.0)
Monocytes Absolute: 0.4 10*3/uL (ref 0.1–1.0)
Monocytes Relative: 7 % (ref 3.0–12.0)
Neutro Abs: 3.5 10*3/uL (ref 1.4–7.7)
Neutrophils Relative %: 59.4 % (ref 43.0–77.0)
Platelets: 247 10*3/uL (ref 150.0–400.0)
RBC: 3.97 Mil/uL (ref 3.87–5.11)
RDW: 13.8 % (ref 11.5–14.6)
WBC: 5.8 10*3/uL (ref 4.5–10.5)

## 2012-06-28 LAB — BASIC METABOLIC PANEL
BUN: 13 mg/dL (ref 6–23)
CO2: 26 mEq/L (ref 19–32)
Calcium: 9.4 mg/dL (ref 8.4–10.5)
Chloride: 107 mEq/L (ref 96–112)
Creatinine, Ser: 0.9 mg/dL (ref 0.4–1.2)
GFR: 74.58 mL/min (ref >60.00)
Glucose, Bld: 74 mg/dL (ref 70–99)
Potassium: 4.1 mEq/L (ref 3.5–5.1)
Sodium: 140 mEq/L (ref 135–145)

## 2012-06-28 LAB — TSH: TSH: 4.92 u[IU]/mL (ref 0.35–5.50)

## 2012-06-28 LAB — LIPID PANEL
Cholesterol: 257 mg/dL — ABNORMAL HIGH (ref 0–200)
HDL: 83.7 mg/dL (ref >39.00)
Total CHOL/HDL Ratio: 3
Triglycerides: 77 mg/dL (ref 0.0–149.0)
VLDL: 15.4 mg/dL (ref 0.0–40.0)

## 2012-06-28 LAB — LDL CHOLESTEROL, DIRECT: Direct LDL: 149.9 mg/dL

## 2012-06-30 ENCOUNTER — Encounter: Payer: Self-pay | Admitting: Family Medicine

## 2012-06-30 NOTE — Progress Notes (Signed)
Quick Note:  I left a voice message with results and put a copy in mail. ______

## 2012-07-05 ENCOUNTER — Encounter: Payer: Self-pay | Admitting: Family Medicine

## 2012-07-05 ENCOUNTER — Ambulatory Visit (INDEPENDENT_AMBULATORY_CARE_PROVIDER_SITE_OTHER): Payer: BC Managed Care – PPO | Admitting: Family Medicine

## 2012-07-05 VITALS — BP 108/78 | HR 72 | Temp 98.5°F | Ht 62.5 in | Wt 135.0 lb

## 2012-07-05 DIAGNOSIS — Z0000 Encounter for general adult medical examination without abnormal findings: Secondary | ICD-10-CM

## 2012-07-05 DIAGNOSIS — Z23 Encounter for immunization: Secondary | ICD-10-CM

## 2012-07-05 MED ORDER — ALPRAZOLAM ER 1 MG PO TB24: 1.0000 mg | ORAL_TABLET | Freq: Two times a day (BID) | ORAL | Status: DC

## 2012-07-05 MED ORDER — SUMATRIPTAN SUCCINATE 100 MG PO TABS: 100.0000 mg | ORAL_TABLET | ORAL | Status: DC | PRN

## 2012-07-05 NOTE — Progress Notes (Signed)
  Subjective:    Patient ID: Sarah Ford, female    DOB: [birth year], 51 y.o.   MRN: [id number]  HPI 51 yr old female for a cpx. She feels fine and has no concerns. I asked about her drop in Hgb on her labs, but she explained that she had donated blood to [facility] just one week before her labs were drawn here.    Review of Systems  Constitutional: Negative.   HENT: Negative.   Eyes: Negative.   Respiratory: Negative.   Cardiovascular: Negative.   Gastrointestinal: Negative.   Genitourinary: Negative for dysuria, urgency, frequency, hematuria, flank pain, decreased urine volume, enuresis, difficulty urinating, pelvic pain and dyspareunia.  Musculoskeletal: Negative.   Skin: Negative.   Neurological: Negative.   Hematological: Negative.   Psychiatric/Behavioral: Negative.        Objective:   Physical Exam  Constitutional: She is oriented to person, place, and time. She appears well-developed and well-nourished. No distress.  HENT:  Head: Normocephalic and atraumatic.  Right Ear: External ear normal.  Left Ear: External ear normal.  Nose: Nose normal.  Mouth/Throat: Oropharynx is clear and moist. No oropharyngeal exudate.  Eyes: Conjunctivae normal and EOM are normal. Pupils are equal, round, and reactive to light. No scleral icterus.  Neck: Normal range of motion. Neck supple. No JVD present. No thyromegaly present.  Cardiovascular: Normal rate, regular rhythm, normal heart sounds and intact distal pulses.  Exam reveals no gallop and no friction rub.   No murmur heard. Pulmonary/Chest: Effort normal and breath sounds normal. No respiratory distress. She has no wheezes. She has no rales. She exhibits no tenderness.  Abdominal: Soft. Bowel sounds are normal. She exhibits no distension and no mass. There is no tenderness. There is no rebound and no guarding.  Musculoskeletal: Normal range of motion. She exhibits no edema and no tenderness.  Lymphadenopathy:    She has no  cervical adenopathy.  Neurological: She is alert and oriented to person, place, and time. She has normal reflexes. No cranial nerve deficit. She exhibits normal muscle tone. Coordination normal.  Skin: Skin is warm and dry. No rash noted. No erythema.  Psychiatric: She has a normal mood and affect. Her behavior is normal. Judgment and thought content normal.          Assessment & Plan:  Well exam.

## 2012-07-05 NOTE — Addendum Note (Signed)
Addended by: Aniceto Boss A on: 07/05/2012 12:59 PM   Modules accepted: Orders

## 2012-07-10 ENCOUNTER — Telehealth: Payer: Self-pay | Admitting: Family Medicine

## 2012-07-10 NOTE — Telephone Encounter (Signed)
Refill request for Temazepam 30 mg take 1 po qhs prn and last here on 07/05/12. 

## 2012-07-11 MED ORDER — TEMAZEPAM 30 MG PO CAPS: 30.0000 mg | ORAL_CAPSULE | Freq: Every evening | ORAL | Status: DC | PRN

## 2012-07-11 NOTE — Telephone Encounter (Signed)
Call in #30 with 5 rf 

## 2012-07-11 NOTE — Telephone Encounter (Signed)
I called in script 

## 2012-12-14 ENCOUNTER — Ambulatory Visit (INDEPENDENT_AMBULATORY_CARE_PROVIDER_SITE_OTHER): Payer: BC Managed Care – PPO | Admitting: Family Medicine

## 2012-12-14 DIAGNOSIS — Z23 Encounter for immunization: Secondary | ICD-10-CM

## 2013-01-09 ENCOUNTER — Telehealth: Payer: Self-pay | Admitting: Family Medicine

## 2013-01-09 MED ORDER — AZITHROMYCIN 250 MG PO TABS: ORAL_TABLET | ORAL | Status: DC

## 2013-01-09 NOTE — Telephone Encounter (Signed)
She has sinus pressure, PND, and a ST. No fever or cough.

## 2013-01-26 ENCOUNTER — Telehealth: Payer: Self-pay | Admitting: *Deleted

## 2013-01-26 MED ORDER — TEMAZEPAM 30 MG PO CAPS: 30.0000 mg | ORAL_CAPSULE | Freq: Every evening | ORAL | Status: DC | PRN

## 2013-01-26 MED ORDER — ALPRAZOLAM ER 1 MG PO TB24: 1.0000 mg | ORAL_TABLET | Freq: Two times a day (BID) | ORAL | Status: DC

## 2013-01-26 NOTE — Telephone Encounter (Signed)
Refill both for 6 months. 

## 2013-01-26 NOTE — Telephone Encounter (Signed)
Patient is requesting a refill of Alprazolam and Temazepam.  Is this okay to fill?

## 2013-03-05 ENCOUNTER — Other Ambulatory Visit: Payer: Self-pay | Admitting: Family Medicine

## 2013-07-12 ENCOUNTER — Telehealth: Payer: Self-pay | Admitting: Family Medicine

## 2013-07-12 NOTE — Telephone Encounter (Signed)
Refill request for Alprazolam ER & Temazepam 30 mg and send to [facility].

## 2013-07-12 NOTE — Telephone Encounter (Signed)
Refill both for 6 months. 

## 2013-07-13 MED ORDER — TEMAZEPAM 30 MG PO CAPS: 30.0000 mg | ORAL_CAPSULE | Freq: Every evening | ORAL | Status: DC | PRN

## 2013-07-13 MED ORDER — ALPRAZOLAM ER 1 MG PO TB24: 1.0000 mg | ORAL_TABLET | Freq: Two times a day (BID) | ORAL | Status: DC

## 2013-07-13 NOTE — Telephone Encounter (Signed)
I called in both scripts. 

## 2013-09-05 ENCOUNTER — Other Ambulatory Visit: Payer: Self-pay | Admitting: Family Medicine

## 2013-10-08 ENCOUNTER — Other Ambulatory Visit: Payer: Self-pay | Admitting: Family Medicine

## 2013-11-12 ENCOUNTER — Telehealth: Payer: Self-pay | Admitting: Family Medicine

## 2013-11-12 NOTE — Telephone Encounter (Signed)
[facility] - [place] - [id number] requesting refill of SUMAtriptan (IMITREX) 100 MG tablet, last filled 10/04/13

## 2013-11-13 MED ORDER — SUMATRIPTAN SUCCINATE 100 MG PO TABS: ORAL_TABLET | ORAL | Status: DC

## 2013-11-13 NOTE — Telephone Encounter (Signed)
I sent script e-scribe. 

## 2014-01-14 ENCOUNTER — Other Ambulatory Visit (INDEPENDENT_AMBULATORY_CARE_PROVIDER_SITE_OTHER): Payer: BC Managed Care – PPO

## 2014-01-14 ENCOUNTER — Other Ambulatory Visit: Payer: Self-pay | Admitting: Family Medicine

## 2014-01-14 DIAGNOSIS — Z0000 Encounter for general adult medical examination without abnormal findings: Secondary | ICD-10-CM

## 2014-01-14 LAB — BASIC METABOLIC PANEL
BUN: 11 mg/dL (ref 6–23)
CO2: 28 mEq/L (ref 19–32)
Calcium: 9.5 mg/dL (ref 8.4–10.5)
Chloride: 105 mEq/L (ref 96–112)
Creatinine, Ser: 0.8 mg/dL (ref 0.4–1.2)
GFR: 86.79 mL/min (ref >60.00)
Glucose, Bld: 80 mg/dL (ref 70–99)
Potassium: 4.4 mEq/L (ref 3.5–5.1)
Sodium: 140 mEq/L (ref 135–145)

## 2014-01-14 LAB — CBC WITH DIFFERENTIAL/PLATELET
Basophils Absolute: 0 10*3/uL (ref 0.0–0.1)
Basophils Relative: 0.6 % (ref 0.0–3.0)
Eosinophils Absolute: 0.1 10*3/uL (ref 0.0–0.7)
Eosinophils Relative: 1.8 % (ref 0.0–5.0)
HCT: 38.8 % (ref 36.0–46.0)
Hemoglobin: 13.1 g/dL (ref 12.0–15.0)
Lymphocytes Relative: 30.8 % (ref 12.0–46.0)
Lymphs Abs: 1.7 10*3/uL (ref 0.7–4.0)
MCHC: 33.8 g/dL (ref 30.0–36.0)
MCV: 89.2 fl (ref 78.0–100.0)
Monocytes Absolute: 0.4 10*3/uL (ref 0.1–1.0)
Monocytes Relative: 7.4 % (ref 3.0–12.0)
Neutro Abs: 3.2 10*3/uL (ref 1.4–7.7)
Neutrophils Relative %: 59.4 % (ref 43.0–77.0)
Platelets: 219 10*3/uL (ref 150.0–400.0)
RBC: 4.36 Mil/uL (ref 3.87–5.11)
RDW: 13.1 % (ref 11.5–14.6)
WBC: 5.4 10*3/uL (ref 4.5–10.5)

## 2014-01-14 LAB — POCT URINALYSIS DIPSTICK
Bilirubin, UA: NEGATIVE
Blood, UA: NEGATIVE
Glucose, UA: NEGATIVE
Ketones, UA: NEGATIVE
Leukocytes, UA: NEGATIVE
Nitrite, UA: NEGATIVE
Protein, UA: NEGATIVE
Spec Grav, UA: <=1.005
Urobilinogen, UA: 0.2
pH, UA: 6

## 2014-01-14 LAB — LIPID PANEL
Cholesterol: 245 mg/dL — ABNORMAL HIGH (ref 0–200)
HDL: 69.4 mg/dL (ref >39.00)
LDL Cholesterol: 162 mg/dL — ABNORMAL HIGH (ref 0–99)
Total CHOL/HDL Ratio: 4
Triglycerides: 70 mg/dL (ref 0.0–149.0)
VLDL: 14 mg/dL (ref 0.0–40.0)

## 2014-01-14 LAB — HEPATIC FUNCTION PANEL
ALT: 27 U/L (ref 0–35)
AST: 25 U/L (ref 0–37)
Albumin: 4.3 g/dL (ref 3.5–5.2)
Alkaline Phosphatase: 84 U/L (ref 39–117)
Bilirubin, Direct: 0 mg/dL (ref 0.0–0.3)
Total Bilirubin: 0.5 mg/dL (ref 0.3–1.2)
Total Protein: 7.2 g/dL (ref 6.0–8.3)

## 2014-01-14 LAB — TSH: TSH: 1.92 u[IU]/mL (ref 0.35–5.50)

## 2014-01-15 LAB — VITAMIN D 25 HYDROXY (VIT D DEFICIENCY, FRACTURES): Vit D, 25-Hydroxy: 32 ng/mL (ref 30–89)

## 2014-01-21 ENCOUNTER — Encounter: Payer: Self-pay | Admitting: Family Medicine

## 2014-01-21 ENCOUNTER — Ambulatory Visit (INDEPENDENT_AMBULATORY_CARE_PROVIDER_SITE_OTHER): Payer: BC Managed Care – PPO | Admitting: Family Medicine

## 2014-01-21 VITALS — BP 116/78 | HR 82 | Temp 98.2°F | Ht 62.5 in | Wt 135.0 lb

## 2014-01-21 DIAGNOSIS — Z0000 Encounter for general adult medical examination without abnormal findings: Secondary | ICD-10-CM

## 2014-01-21 MED ORDER — DIAZEPAM 10 MG PO TABS: 10.0000 mg | ORAL_TABLET | Freq: Every evening | ORAL | Status: DC | PRN

## 2014-01-21 MED ORDER — RALOXIFENE HCL 60 MG PO TABS: 60.0000 mg | ORAL_TABLET | Freq: Every day | ORAL | Status: DC

## 2014-01-21 NOTE — Progress Notes (Signed)
Pre visit review using our clinic review tool, if applicable. No additional management support is needed unless otherwise documented below in the visit note. 

## 2014-01-21 NOTE — Progress Notes (Signed)
   Subjective:    Patient ID: Alfonse RasDianne Sisler, female    DOB: 05/12/1961, 53 y.o.   MRN: [id number]  HPI 53 yr old female for a cpx. She feels well. She had been getting Evista and Valium from Dr. Rana SnareLowe but she asks us to write for these.    Review of Systems  Constitutional: Negative.   HENT: Negative.   Eyes: Negative.   Respiratory: Negative.   Cardiovascular: Negative.   Gastrointestinal: Negative.   Genitourinary: Negative for dysuria, urgency, frequency, hematuria, flank pain, decreased urine volume, enuresis, difficulty urinating, pelvic pain and dyspareunia.  Musculoskeletal: Negative.   Skin: Negative.   Neurological: Negative.   Psychiatric/Behavioral: Negative.        Objective:   Physical Exam  Constitutional: She is oriented to person, place, and time. She appears well-developed and well-nourished. No distress.  HENT:  Head: Normocephalic and atraumatic.  Right Ear: External ear normal.  Left Ear: External ear normal.  Nose: Nose normal.  Mouth/Throat: Oropharynx is clear and moist. No oropharyngeal exudate.  Eyes: Conjunctivae and EOM are normal. Pupils are equal, round, and reactive to light. No scleral icterus.  Neck: Normal range of motion. Neck supple. No JVD present. No thyromegaly present.  Cardiovascular: Normal rate, regular rhythm, normal heart sounds and intact distal pulses.  Exam reveals no gallop and no friction rub.   No murmur heard. EKG normal  Pulmonary/Chest: Effort normal and breath sounds normal. No respiratory distress. She has no wheezes. She has no rales. She exhibits no tenderness.  Abdominal: Soft. Bowel sounds are normal. She exhibits no distension and no mass. There is no tenderness. There is no rebound and no guarding.  Musculoskeletal: Normal range of motion. She exhibits no edema and no tenderness.  Lymphadenopathy:    She has no cervical adenopathy.  Neurological: She is alert and oriented to person, place, and time. She has normal  reflexes. No cranial nerve deficit. She exhibits normal muscle tone. Coordination normal.  Skin: Skin is warm and dry. No rash noted. No erythema.  Psychiatric: She has a normal mood and affect. Her behavior is normal. Judgment and thought content normal.          Assessment & Plan:  Well exam. She will have a colonoscopy per Dr. Kinnie ScalesMedoff in October.

## 2014-01-22 ENCOUNTER — Telehealth: Payer: Self-pay | Admitting: Family Medicine

## 2014-01-22 NOTE — Telephone Encounter (Signed)
Relevant patient education mailed to patient.  

## 2014-01-25 ENCOUNTER — Telehealth: Payer: Self-pay | Admitting: Family Medicine

## 2014-01-25 NOTE — Telephone Encounter (Signed)
Request for Alprazolam XR 1 mg take 1 po bid and also Temazepam 30 mg take 1 po qhs.

## 2014-01-28 NOTE — Telephone Encounter (Signed)
She asked me to prescribe Valium for her to take at bedtime for sleep, but I cannot do this and give her Xanax and Temazepam all at the same time. Something will have to be stopped

## 2014-01-30 NOTE — Telephone Encounter (Signed)
I tried to reach pt by phone and no answer.

## 2014-02-14 ENCOUNTER — Telehealth: Payer: Self-pay | Admitting: Family Medicine

## 2014-02-14 NOTE — Telephone Encounter (Signed)
[facility] - [place], [place] - [address] is requesting re-fill on SUMAtriptan (IMITREX) 100 MG tablet

## 2014-02-15 MED ORDER — SUMATRIPTAN SUCCINATE 100 MG PO TABS: ORAL_TABLET | ORAL | Status: DC

## 2014-02-15 NOTE — Telephone Encounter (Signed)
I sent script e-scribe. 

## 2014-04-16 ENCOUNTER — Telehealth: Payer: Self-pay | Admitting: Family Medicine

## 2014-04-16 NOTE — Telephone Encounter (Signed)
[facility] - [place], [place] - [id number] requesting refill of escitalopram (LEXAPRO) 20 MG tablet #60, last filled 03/04/14

## 2014-04-17 NOTE — Telephone Encounter (Signed)
Refill for one year 

## 2014-04-18 MED ORDER — ESCITALOPRAM OXALATE 20 MG PO TABS: 30.0000 mg | ORAL_TABLET | Freq: Every day | ORAL | Status: DC

## 2014-04-18 NOTE — Telephone Encounter (Signed)
I sent script e-scribe. 

## 2014-04-28 ENCOUNTER — Other Ambulatory Visit: Payer: Self-pay | Admitting: Family Medicine

## 2014-05-21 ENCOUNTER — Other Ambulatory Visit: Payer: Self-pay | Admitting: Family Medicine

## 2014-07-26 ENCOUNTER — Ambulatory Visit (INDEPENDENT_AMBULATORY_CARE_PROVIDER_SITE_OTHER): Payer: BC Managed Care – PPO | Admitting: *Deleted

## 2014-07-26 ENCOUNTER — Telehealth: Payer: Self-pay | Admitting: Family Medicine

## 2014-07-26 ENCOUNTER — Ambulatory Visit: Payer: BC Managed Care – PPO

## 2014-07-26 DIAGNOSIS — Z23 Encounter for immunization: Secondary | ICD-10-CM

## 2014-07-26 NOTE — Telephone Encounter (Signed)
[facility] - [place], [place] - [address] is requesting re-fill on diazepam (VALIUM) 10 MG tablet

## 2014-07-29 NOTE — Telephone Encounter (Signed)
Call in #90 with one rf 

## 2014-07-30 MED ORDER — DIAZEPAM 10 MG PO TABS: 10.0000 mg | ORAL_TABLET | Freq: Every evening | ORAL | Status: DC | PRN

## 2014-07-30 NOTE — Telephone Encounter (Signed)
I called in script 

## 2014-08-28 HISTORY — PX: COLONOSCOPY: SHX174

## 2014-09-09 ENCOUNTER — Encounter: Payer: Self-pay | Admitting: Family Medicine

## 2014-09-16 ENCOUNTER — Other Ambulatory Visit: Payer: Self-pay | Admitting: *Deleted

## 2014-09-16 MED ORDER — SUMATRIPTAN SUCCINATE 100 MG PO TABS: ORAL_TABLET | ORAL | Status: DC

## 2014-10-25 ENCOUNTER — Other Ambulatory Visit: Payer: Self-pay | Admitting: Family Medicine

## 2014-11-19 ENCOUNTER — Other Ambulatory Visit: Payer: Self-pay | Admitting: Family Medicine

## 2014-12-10 ENCOUNTER — Telehealth: Payer: Self-pay | Admitting: Family Medicine

## 2014-12-10 NOTE — Telephone Encounter (Signed)
I spoke with pt and she will call back to schedule appointment.

## 2014-12-10 NOTE — Telephone Encounter (Addendum)
Pt request refill diazepam (VALIUM) 10 MG tablet Pt states she is having to take 3-4 a night to get to sleep and wants an early refill [facility] [address]

## 2014-12-10 NOTE — Telephone Encounter (Signed)
NO this is way too much Valium every night. Make an OV so we can discuss an alternative med

## 2015-01-09 ENCOUNTER — Other Ambulatory Visit: Payer: Self-pay | Admitting: Family Medicine

## 2015-01-09 NOTE — Telephone Encounter (Signed)
Call in #90 with 1 rf  

## 2015-01-10 ENCOUNTER — Other Ambulatory Visit: Payer: Self-pay | Admitting: Family Medicine

## 2015-01-23 ENCOUNTER — Other Ambulatory Visit: Payer: Self-pay | Admitting: Family Medicine

## 2015-02-03 ENCOUNTER — Telehealth: Payer: Self-pay | Admitting: Family Medicine

## 2015-02-03 NOTE — Telephone Encounter (Signed)
Pt staes she is going to [place] 2 weeks and would like to have a zpak Diflucan  temazepam (RESTORIL) 30 MG capsule (2 week supply) (Wants to take these meds w/ he just in case) [facility]

## 2015-02-04 ENCOUNTER — Other Ambulatory Visit: Payer: Self-pay | Admitting: Family Medicine

## 2015-02-04 NOTE — Telephone Encounter (Signed)
Call in Temazepam 30 mg #14, Diflucan 150 mg #5, and a Zpack

## 2015-02-05 MED ORDER — FLUCONAZOLE 150 MG PO TABS: 150.0000 mg | ORAL_TABLET | Freq: Once | ORAL | Status: DC

## 2015-02-05 MED ORDER — AZITHROMYCIN 250 MG PO TABS: ORAL_TABLET | ORAL | Status: DC

## 2015-02-05 MED ORDER — TEMAZEPAM 30 MG PO CAPS: 30.0000 mg | ORAL_CAPSULE | Freq: Every evening | ORAL | Status: DC | PRN

## 2015-02-05 NOTE — Telephone Encounter (Signed)
I sent scripts to pharmacy and spoke with pt.

## 2015-02-26 ENCOUNTER — Other Ambulatory Visit: Payer: Self-pay | Admitting: Family Medicine

## 2015-02-26 NOTE — Telephone Encounter (Signed)
Last OV 01/21/14. Ok to fill?

## 2015-02-27 ENCOUNTER — Other Ambulatory Visit: Payer: Self-pay | Admitting: Family Medicine

## 2015-03-03 ENCOUNTER — Other Ambulatory Visit: Payer: Self-pay | Admitting: Obstetrics and Gynecology

## 2015-03-04 LAB — CYTOLOGY - PAP

## 2015-03-05 ENCOUNTER — Other Ambulatory Visit: Payer: Self-pay | Admitting: Obstetrics and Gynecology

## 2015-03-05 DIAGNOSIS — R928 Other abnormal and inconclusive findings on diagnostic imaging of breast: Secondary | ICD-10-CM

## 2015-03-14 ENCOUNTER — Ambulatory Visit
Admission: RE | Admit: 2015-03-14 | Discharge: 2015-03-14 | Disposition: A | Discharge status: Home / Self Care | Payer: BLUE CROSS/BLUE SHIELD | Source: Ambulatory Visit | Attending: Obstetrics and Gynecology | Admitting: Obstetrics and Gynecology

## 2015-03-14 DIAGNOSIS — R928 Other abnormal and inconclusive findings on diagnostic imaging of breast: Secondary | ICD-10-CM

## 2015-03-26 ENCOUNTER — Other Ambulatory Visit: Payer: Self-pay | Admitting: Family Medicine

## 2015-03-26 ENCOUNTER — Telehealth: Payer: Self-pay | Admitting: Family Medicine

## 2015-03-26 NOTE — Telephone Encounter (Signed)
Per Dr. Clent RidgesFry okay to give approval for early refill. I did call and speak with pharmacy, they will fill script.

## 2015-03-26 NOTE — Telephone Encounter (Signed)
Pt would like early refill diazepam. Pt is going on trip

## 2015-03-26 NOTE — Telephone Encounter (Signed)
She is not due until October 22, she still has a refill left

## 2015-04-30 ENCOUNTER — Other Ambulatory Visit: Payer: Self-pay | Admitting: Family Medicine

## 2015-05-01 ENCOUNTER — Other Ambulatory Visit: Payer: Self-pay | Admitting: Family Medicine

## 2015-05-01 NOTE — Telephone Encounter (Signed)
Pt was last here in office on 01/21/14, she is due for a office visit and due for a refill. Maybe we can approve a 1 month only and I will call pt to advise to schedule a office visit?

## 2015-05-01 NOTE — Telephone Encounter (Signed)
Ok to refill 

## 2015-05-02 NOTE — Telephone Encounter (Signed)
I spoke with pt and she will schedule a office visit.

## 2015-05-02 NOTE — Telephone Encounter (Signed)
OK to refill for one month 

## 2015-05-22 ENCOUNTER — Ambulatory Visit: Payer: BLUE CROSS/BLUE SHIELD | Admitting: Family Medicine

## 2015-05-30 ENCOUNTER — Ambulatory Visit (INDEPENDENT_AMBULATORY_CARE_PROVIDER_SITE_OTHER): Payer: BLUE CROSS/BLUE SHIELD | Admitting: Family Medicine

## 2015-05-30 ENCOUNTER — Encounter: Payer: Self-pay | Admitting: Family Medicine

## 2015-05-30 VITALS — BP 100/75 | HR 70 | Temp 98.4°F | Ht 62.5 in | Wt 138.0 lb

## 2015-05-30 DIAGNOSIS — Z23 Encounter for immunization: Secondary | ICD-10-CM | POA: Diagnosis not present

## 2015-05-30 DIAGNOSIS — G43809 Other migraine, not intractable, without status migrainosus: Secondary | ICD-10-CM | POA: Diagnosis not present

## 2015-05-30 DIAGNOSIS — F418 Other specified anxiety disorders: Secondary | ICD-10-CM

## 2015-05-30 DIAGNOSIS — G4700 Insomnia, unspecified: Secondary | ICD-10-CM

## 2015-05-30 MED ORDER — SUMATRIPTAN SUCCINATE 100 MG PO TABS: 100.0000 mg | ORAL_TABLET | Freq: Once | ORAL | Status: DC

## 2015-05-30 MED ORDER — ESCITALOPRAM OXALATE 20 MG PO TABS: 20.0000 mg | ORAL_TABLET | Freq: Every day | ORAL | Status: DC

## 2015-05-30 NOTE — Addendum Note (Signed)
Addended by: Aniceto Boss A on: 05/30/2015 10:17 AM   Modules accepted: Orders

## 2015-05-30 NOTE — Progress Notes (Signed)
   Subjective:    Patient ID: Sarah Ford, female    DOB: 11/14/1960, 54 y.o.   MRN: [id number]  HPI Here to follow up. She is doing well. Her anxiety is well controlled and she is sleeping well. Her migraines respond well to Imitrex and she gets them only every 2-3 months now.    Review of Systems  Constitutional: Negative.   Respiratory: Negative.   Cardiovascular: Negative.   Neurological: Negative.   Psychiatric/Behavioral: Negative.        Objective:   Physical Exam  Constitutional: She is oriented to person, place, and time. She appears well-developed and well-nourished.  Neck: No thyromegaly present.  Cardiovascular: Normal rate, regular rhythm, normal heart sounds and intact distal pulses.   Pulmonary/Chest: Effort normal and breath sounds normal.  Lymphadenopathy:    She has no cervical adenopathy.  Neurological: She is alert and oriented to person, place, and time.  Psychiatric: She has a normal mood and affect. Her behavior is normal. Thought content normal.          Assessment & Plan:  She is doing well. Her migraines are stable. Her depression and anxiety are stable.

## 2015-05-30 NOTE — Progress Notes (Signed)
Pre visit review using our clinic review tool, if applicable. No additional management support is needed unless otherwise documented below in the visit note. 

## 2015-06-20 ENCOUNTER — Other Ambulatory Visit: Payer: Self-pay | Admitting: Family Medicine

## 2015-06-24 NOTE — Telephone Encounter (Signed)
Call in #90 with one rf 

## 2015-06-26 ENCOUNTER — Other Ambulatory Visit: Payer: Self-pay | Admitting: Family Medicine

## 2015-07-23 ENCOUNTER — Telehealth: Payer: Self-pay | Admitting: Family Medicine

## 2015-07-23 MED ORDER — ESCITALOPRAM OXALATE 20 MG PO TABS: 20.0000 mg | ORAL_TABLET | Freq: Two times a day (BID) | ORAL | Status: DC

## 2015-07-24 NOTE — Telephone Encounter (Signed)
done

## 2015-09-17 ENCOUNTER — Other Ambulatory Visit: Payer: Self-pay | Admitting: Family Medicine

## 2015-09-19 NOTE — Telephone Encounter (Signed)
She should have refills until April

## 2015-12-15 ENCOUNTER — Other Ambulatory Visit: Payer: Self-pay | Admitting: Family Medicine

## 2015-12-16 NOTE — Telephone Encounter (Signed)
Call in #90 with one rf 

## 2016-01-20 ENCOUNTER — Telehealth: Payer: Self-pay | Admitting: Family Medicine

## 2016-01-20 MED ORDER — AZITHROMYCIN 250 MG PO TABS: ORAL_TABLET | ORAL | Status: DC

## 2016-01-20 NOTE — Telephone Encounter (Signed)
[place] with her kids . Pt would like zpak just in case they need it [facility] [address]

## 2016-01-20 NOTE — Telephone Encounter (Signed)
I sent script e-scribe and left a voice message for pt. 

## 2016-01-20 NOTE — Telephone Encounter (Signed)
Call in a Zpack  ?

## 2016-01-30 ENCOUNTER — Telehealth: Payer: Self-pay | Admitting: Family Medicine

## 2016-01-30 NOTE — Telephone Encounter (Signed)
Call in Temazepam 30 mg to take qhs, #30 with no rf

## 2016-01-30 NOTE — Telephone Encounter (Signed)
Pt states she is going on a vacation with her family at the end of the month. They are all going to be sleeping in the same room. Pt states she is a light sleeper, and would like to know if Dr Clent RidgesFry will give her a month supply of a sleep medicine.  [facility] [address]

## 2016-02-02 MED ORDER — TEMAZEPAM 30 MG PO CAPS
30.0000 mg | ORAL_CAPSULE | Freq: Every evening | ORAL | Status: DC | PRN
Start: 2016-02-02 — End: 2016-02-28

## 2016-02-02 NOTE — Telephone Encounter (Signed)
I called in script and left a voice message for pt. 

## 2016-02-05 ENCOUNTER — Telehealth: Payer: Self-pay | Admitting: Family Medicine

## 2016-02-05 NOTE — Telephone Encounter (Signed)
Pt would like to increase lexapro 20  Mg to 40 mg #90 w/refills  [facility] [address]

## 2016-02-06 MED ORDER — ESCITALOPRAM OXALATE 20 MG PO TABS: 20.0000 mg | ORAL_TABLET | Freq: Two times a day (BID) | ORAL | Status: DC

## 2016-02-06 NOTE — Telephone Encounter (Signed)
I spoke with pt and sent script e-scribe. 

## 2016-02-06 NOTE — Telephone Encounter (Signed)
Increase the Lexapro to 20 mg bid, call in one year supply

## 2016-02-28 ENCOUNTER — Other Ambulatory Visit: Payer: Self-pay | Admitting: Family Medicine

## 2016-03-02 NOTE — Telephone Encounter (Signed)
Call in #30 with 5 rf 

## 2016-03-03 NOTE — Telephone Encounter (Signed)
I have already answered this °

## 2016-03-11 ENCOUNTER — Telehealth: Payer: Self-pay | Admitting: Family Medicine

## 2016-03-11 NOTE — Telephone Encounter (Signed)
[facility] - Client TELEPHONE ADVICE RECORD [facility] Patient Name: Sarah RasDIANNE Ford DOB: 17-Sep-1961 Initial Comment Caller states is currently [place] at the beach, went to the shower notice a tick, pulled it off and has a red spot. Nurse Assessment Nurse: Debera Latalston, RN, Tinnie GensJeffrey Date/Time Lamount Cohen(Eastern Time): 03/11/2016 4:24:38 PM Confirm and document reason for call. If symptomatic, describe symptoms. You must click the next button to save text entered. ---Caller states is currently on vacation at the beach, went to the shower notice a tick, pulled it off and has a red spot. Has the patient traveled out of the country within the last 30 days? ---No Does the patient have any new or worsening symptoms? ---Yes Will a triage be completed? ---Yes Related visit to physician within the last 2 weeks? ---N/A Does the PT have any chronic conditions? (i.e. diabetes, asthma, etc.) ---No Is the patient pregnant or possibly pregnant? (Ask all females between the ages of 1912-55) ---No Is this a behavioral health or substance abuse call? ---No Guidelines Guideline Title Affirmed Question Affirmed Notes Tick Bite Tick bite with no complications Final Disposition User Home Care [facility] CenterRalston, RN, Tinnie GensJeffrey Disagree/Comply: Comply

## 2016-03-14 ENCOUNTER — Other Ambulatory Visit: Payer: Self-pay | Admitting: Family Medicine

## 2016-03-15 NOTE — Telephone Encounter (Signed)
Can you attach the home care advice to this note? Do I need to do anything with this?

## 2016-03-16 NOTE — Telephone Encounter (Signed)
[facility] gave pt home care advice.

## 2016-03-16 NOTE — Telephone Encounter (Signed)
  Caller Understands: Yes Disagree/Comply: Comply Care Advice Given Per Guideline HOME CARE: You should be able to treat this at home. REASSURANCE: Most tick bites are harmless and can be treated at home. The spread of disease by ticks is rare. WOOD TICK REMOVAL - BEST METHOD: * Use a tweezers and grasp the wood tick close to the skin (on its head). * Pull the wood tick straight upward without twisting or crushing it. * Maintain a steady pressure until it releases its grip. * If tweezers aren't available, use fingers, a loop of thread around the jaws, or a needle between the jaws for traction. * Note: covering the tick with petroleum jelly, nail polish or rubbing alcohol doesn't work. Neither does touching the tick with a hot or cold object. * Wash your hands. ANTIBIOTIC OINTMENT: Wash the wound and your hands with soap and water after removal to prevent catching any tick disease. Apply antibiotic ointment (OTC) to the bite once. EXPECTED COURSE: Tick bites normally don't itch or hurt. That's why they often go unnoticed. CALL BACK IF: * You can't remove the tick or the tick's head * Fever or rash occur in the next 2 weeks * Bite begins to look infected * You become worse. CARE ADVICE given per Tick Bites (Adult) guideline.

## 2016-03-26 ENCOUNTER — Other Ambulatory Visit: Payer: Self-pay | Admitting: Family Medicine

## 2016-03-26 ENCOUNTER — Encounter: Payer: Self-pay | Admitting: Family Medicine

## 2016-03-26 ENCOUNTER — Ambulatory Visit (INDEPENDENT_AMBULATORY_CARE_PROVIDER_SITE_OTHER): Payer: BLUE CROSS/BLUE SHIELD | Admitting: Family Medicine

## 2016-03-26 VITALS — BP 100/70 | HR 79 | Temp 98.4°F | Ht 62.5 in | Wt 140.6 lb

## 2016-03-26 DIAGNOSIS — T148 Other injury of unspecified body region: Secondary | ICD-10-CM | POA: Diagnosis not present

## 2016-03-26 DIAGNOSIS — W57XXXA Bitten or stung by nonvenomous insect and other nonvenomous arthropods, initial encounter: Secondary | ICD-10-CM | POA: Diagnosis not present

## 2016-03-26 NOTE — Progress Notes (Signed)
   Subjective:    Patient ID: Sarah Ford, female    DOB: [birth year], 55 y.o.   MRN: [id number]  HPI Here to check on a tick bite that occurred 2 weeks ago. She discovered the tick on the right groin and pulled it out. The area has been red but not sore since then. No rashes or headache or fever. She has had some mild body aches.    Review of Systems  Constitutional: Negative.   Respiratory: Negative.   Cardiovascular: Negative.   Musculoskeletal: Positive for myalgias. Negative for arthralgias.  Skin: Negative for rash.  Neurological: Negative.        Objective:   Physical Exam  Constitutional: She is oriented to person, place, and time. She appears well-developed and well-nourished.  Cardiovascular: Normal rate, regular rhythm, normal heart sounds and intact distal pulses.   Pulmonary/Chest: Effort normal and breath sounds normal.  Neurological: She is alert and oriented to person, place, and time.  Skin:  Small red papular area on the right groin           Assessment & Plan:  Tick bite. She should be fine but we will check titers for Lyme disease at her request.  Nelwyn SalisburyFRY,Demetri Kerman A, MD

## 2016-03-26 NOTE — Progress Notes (Signed)
Pre visit review using our clinic review tool, if applicable. No additional management support is needed unless otherwise documented below in the visit note. 

## 2016-03-29 LAB — LYME AB/WESTERN BLOT REFLEX: B burgdorferi Ab IgG+IgM: <0.9 Index (ref <0.90)

## 2016-06-16 ENCOUNTER — Other Ambulatory Visit (INDEPENDENT_AMBULATORY_CARE_PROVIDER_SITE_OTHER): Payer: BLUE CROSS/BLUE SHIELD

## 2016-06-16 DIAGNOSIS — Z0000 Encounter for general adult medical examination without abnormal findings: Secondary | ICD-10-CM | POA: Diagnosis not present

## 2016-06-16 LAB — LIPID PANEL
Cholesterol: 245 mg/dL — ABNORMAL HIGH (ref 0–200)
HDL: 64.1 mg/dL (ref >39.00)
LDL Cholesterol: 149 mg/dL — ABNORMAL HIGH (ref 0–99)
NonHDL: 181.08
Total CHOL/HDL Ratio: 4
Triglycerides: 162 mg/dL — ABNORMAL HIGH (ref 0.0–149.0)
VLDL: 32.4 mg/dL (ref 0.0–40.0)

## 2016-06-16 LAB — BASIC METABOLIC PANEL
BUN: 16 mg/dL (ref 6–23)
CO2: 28 mEq/L (ref 19–32)
Calcium: 9.5 mg/dL (ref 8.4–10.5)
Chloride: 105 mEq/L (ref 96–112)
Creatinine, Ser: 0.92 mg/dL (ref 0.40–1.20)
GFR: 67.91 mL/min (ref >60.00)
Glucose, Bld: 85 mg/dL (ref 70–99)
Potassium: 4.1 mEq/L (ref 3.5–5.1)
Sodium: 142 mEq/L (ref 135–145)

## 2016-06-16 LAB — CBC WITH DIFFERENTIAL/PLATELET
Basophils Absolute: 0 10*3/uL (ref 0.0–0.1)
Basophils Relative: 0.6 % (ref 0.0–3.0)
Eosinophils Absolute: 0.1 10*3/uL (ref 0.0–0.7)
Eosinophils Relative: 1.8 % (ref 0.0–5.0)
HCT: 40.9 % (ref 36.0–46.0)
Hemoglobin: 13.7 g/dL (ref 12.0–15.0)
Lymphocytes Relative: 32.3 % (ref 12.0–46.0)
Lymphs Abs: 2 10*3/uL (ref 0.7–4.0)
MCHC: 33.6 g/dL (ref 30.0–36.0)
MCV: 86.5 fl (ref 78.0–100.0)
Monocytes Absolute: 0.5 10*3/uL (ref 0.1–1.0)
Monocytes Relative: 7.4 % (ref 3.0–12.0)
Neutro Abs: 3.6 10*3/uL (ref 1.4–7.7)
Neutrophils Relative %: 57.9 % (ref 43.0–77.0)
Platelets: 258 10*3/uL (ref 150.0–400.0)
RBC: 4.73 Mil/uL (ref 3.87–5.11)
RDW: 13.5 % (ref 11.5–15.5)
WBC: 6.2 10*3/uL (ref 4.0–10.5)

## 2016-06-16 LAB — HEPATIC FUNCTION PANEL
ALT: 19 U/L (ref 0–35)
AST: 20 U/L (ref 0–37)
Albumin: 4.3 g/dL (ref 3.5–5.2)
Alkaline Phosphatase: 93 U/L (ref 39–117)
Bilirubin, Direct: 0.1 mg/dL (ref 0.0–0.3)
Total Bilirubin: 0.4 mg/dL (ref 0.2–1.2)
Total Protein: 6.9 g/dL (ref 6.0–8.3)

## 2016-06-16 LAB — POC URINALSYSI DIPSTICK (AUTOMATED)
Bilirubin, UA: NEGATIVE
Blood, UA: NEGATIVE
Glucose, UA: NEGATIVE
Ketones, UA: NEGATIVE
Leukocytes, UA: NEGATIVE
Nitrite, UA: NEGATIVE
Protein, UA: NEGATIVE
Spec Grav, UA: 1.025
Urobilinogen, UA: 0.2
pH, UA: 5

## 2016-06-16 LAB — TSH: TSH: 5.22 u[IU]/mL — ABNORMAL HIGH (ref 0.35–4.50)

## 2016-06-21 ENCOUNTER — Ambulatory Visit (INDEPENDENT_AMBULATORY_CARE_PROVIDER_SITE_OTHER): Payer: BLUE CROSS/BLUE SHIELD | Admitting: Family Medicine

## 2016-06-21 ENCOUNTER — Encounter: Payer: Self-pay | Admitting: Family Medicine

## 2016-06-21 VITALS — BP 115/85 | HR 66 | Temp 99.1°F | Ht 62.5 in | Wt 136.0 lb

## 2016-06-21 DIAGNOSIS — R946 Abnormal results of thyroid function studies: Secondary | ICD-10-CM | POA: Diagnosis not present

## 2016-06-21 DIAGNOSIS — Z23 Encounter for immunization: Secondary | ICD-10-CM

## 2016-06-21 DIAGNOSIS — Z0000 Encounter for general adult medical examination without abnormal findings: Secondary | ICD-10-CM

## 2016-06-21 DIAGNOSIS — R7989 Other specified abnormal findings of blood chemistry: Secondary | ICD-10-CM

## 2016-06-21 LAB — TSH: TSH: 3.11 u[IU]/mL (ref 0.35–4.50)

## 2016-06-21 LAB — T4, FREE: Free T4: 0.78 ng/dL (ref 0.60–1.60)

## 2016-06-21 LAB — T3, FREE: T3, Free: 3.1 pg/mL (ref 2.3–4.2)

## 2016-06-21 MED ORDER — RALOXIFENE HCL 60 MG PO TABS: 60.0000 mg | ORAL_TABLET | Freq: Every day | ORAL | 3 refills | Status: DC

## 2016-06-21 MED ORDER — LORAZEPAM 1 MG PO TABS: 1.0000 mg | ORAL_TABLET | Freq: Every day | ORAL | 5 refills | Status: DC

## 2016-06-21 NOTE — Progress Notes (Signed)
Pre visit review using our clinic review tool, if applicable. No additional management support is needed unless otherwise documented below in the visit note. 

## 2016-06-21 NOTE — Progress Notes (Signed)
   Subjective:    Patient ID: Alfonse RasDianne Elison, female    DOB: [birth year], 55 y.o.   MRN: [id number]  HPI 55 yr old female for a well exam. She feels fine. She has a DEXA coming up next month. She has tried Temazepam and Diazepam for sleep with mixed results, and now she would like to try Lorazepam. Her recent TSH was elevated.    Review of Systems  Constitutional: Negative.   HENT: Negative.   Eyes: Negative.   Respiratory: Negative.   Cardiovascular: Negative.   Gastrointestinal: Negative.   Genitourinary: Negative for decreased urine volume, difficulty urinating, dyspareunia, dysuria, enuresis, flank pain, frequency, hematuria, pelvic pain and urgency.  Musculoskeletal: Negative.   Skin: Negative.   Neurological: Negative.   Psychiatric/Behavioral: Positive for sleep disturbance. Negative for agitation, confusion, decreased concentration and dysphoric mood. The patient is not nervous/anxious.        Objective:   Physical Exam  Constitutional: She is oriented to person, place, and time. She appears well-developed and well-nourished. No distress.  HENT:  Head: Normocephalic and atraumatic.  Right Ear: External ear normal.  Left Ear: External ear normal.  Nose: Nose normal.  Mouth/Throat: Oropharynx is clear and moist. No oropharyngeal exudate.  Eyes: Conjunctivae and EOM are normal. Pupils are equal, round, and reactive to light. No scleral icterus.  Neck: Normal range of motion. Neck supple. No JVD present. No thyromegaly present.  Cardiovascular: Normal rate, regular rhythm, normal heart sounds and intact distal pulses.  Exam reveals no gallop and no friction rub.   No murmur heard. EKG normal   Pulmonary/Chest: Effort normal and breath sounds normal. No respiratory distress. She has no wheezes. She has no rales. She exhibits no tenderness.  Abdominal: Soft. Bowel sounds are normal. She exhibits no distension and no mass. There is no tenderness. There is no rebound and no  guarding.  Musculoskeletal: Normal range of motion. She exhibits no edema or tenderness.  Lymphadenopathy:    She has no cervical adenopathy.  Neurological: She is alert and oriented to person, place, and time. She has normal reflexes. No cranial nerve deficit. She exhibits normal muscle tone. Coordination normal.  Skin: Skin is warm and dry. No rash noted. No erythema.  Psychiatric: She has a normal mood and affect. Her behavior is normal. Judgment and thought content normal.          Assessment & Plan:  Well exam. We discussed diet and exercise. Try Ativan 1 mg for sleep. Get a full thyroid panel today.  Nelwyn SalisburyFRY,Jaden Batchelder A, MD

## 2016-06-24 ENCOUNTER — Telehealth: Payer: Self-pay | Admitting: Family Medicine

## 2016-06-24 NOTE — Telephone Encounter (Signed)
I left a voice message with results. 

## 2016-06-24 NOTE — Telephone Encounter (Signed)
Pt calling to get lab results.

## 2016-07-26 DIAGNOSIS — Z1231 Encounter for screening mammogram for malignant neoplasm of breast: Secondary | ICD-10-CM | POA: Diagnosis not present

## 2016-07-26 DIAGNOSIS — Z01419 Encounter for gynecological examination (general) (routine) without abnormal findings: Secondary | ICD-10-CM | POA: Diagnosis not present

## 2016-07-26 DIAGNOSIS — Z6823 Body mass index (BMI) 23.0-23.9, adult: Secondary | ICD-10-CM | POA: Diagnosis not present

## 2016-07-26 DIAGNOSIS — Z13820 Encounter for screening for osteoporosis: Secondary | ICD-10-CM | POA: Diagnosis not present

## 2016-07-27 DIAGNOSIS — L570 Actinic keratosis: Secondary | ICD-10-CM | POA: Diagnosis not present

## 2016-08-20 ENCOUNTER — Other Ambulatory Visit: Payer: Self-pay | Admitting: Family Medicine

## 2016-10-27 ENCOUNTER — Telehealth: Payer: Self-pay | Admitting: Family Medicine

## 2016-10-27 NOTE — Telephone Encounter (Signed)
Pt would like you to call

## 2016-10-27 NOTE — Telephone Encounter (Signed)
I spoke with pt , she was really calling about [name] and we have a phone note on that.

## 2016-11-11 ENCOUNTER — Telehealth: Payer: Self-pay | Admitting: Family Medicine

## 2016-11-11 NOTE — Telephone Encounter (Signed)
Pt need new Rx for lorazepam 1 MG take 2 a day instead of 1 a day (per pt)  Pharm:  [facility].

## 2016-11-12 NOTE — Telephone Encounter (Signed)
Call in Ativan 1 mg to take bid, #60 with 5 rf

## 2016-11-12 NOTE — Telephone Encounter (Signed)
Rx called in as directed.   

## 2016-11-16 ENCOUNTER — Telehealth: Payer: Self-pay | Admitting: Family Medicine

## 2016-11-16 NOTE — Telephone Encounter (Signed)
Pt request refill  LORazepam (ATIVAN) 1 MG tablet  BUT  Wants to increase to 2 tabs / night instead of one.  [facility] [place] [place] [place] - [address]

## 2016-11-16 NOTE — Telephone Encounter (Signed)
Call in Ativan 1 mg to take 2 tabs qhs, #60 with 5 rf

## 2016-11-17 MED ORDER — LORAZEPAM 1 MG PO TABS: 2.0000 mg | ORAL_TABLET | Freq: Every day | ORAL | 5 refills | Status: DC

## 2016-11-17 NOTE — Telephone Encounter (Signed)
I called in script and left a voice message for pt with below information.  

## 2016-12-07 ENCOUNTER — Telehealth: Payer: Self-pay | Admitting: Family Medicine

## 2016-12-07 NOTE — Telephone Encounter (Signed)
Pt has a family member in process of dying and she is requesting something for anxiety. Pt is aware md out of office today. [facility] [address]

## 2016-12-07 NOTE — Telephone Encounter (Signed)
She is already on Lexapro and Ativan. We refilled the Ativan a few weeks ago

## 2016-12-07 NOTE — Telephone Encounter (Signed)
I spoke with pt and gave below information.  

## 2017-02-01 ENCOUNTER — Telehealth: Payer: Self-pay | Admitting: Family Medicine

## 2017-02-01 MED ORDER — CYCLOBENZAPRINE HCL 10 MG PO TABS: 10.0000 mg | ORAL_TABLET | Freq: Three times a day (TID) | ORAL | 2 refills | Status: DC | PRN

## 2017-02-01 NOTE — Telephone Encounter (Signed)
done

## 2017-03-02 ENCOUNTER — Other Ambulatory Visit: Payer: Self-pay | Admitting: Family Medicine

## 2017-03-27 ENCOUNTER — Other Ambulatory Visit: Payer: Self-pay | Admitting: Family Medicine

## 2017-06-06 ENCOUNTER — Other Ambulatory Visit: Payer: Self-pay | Admitting: Family Medicine

## 2017-06-06 NOTE — Telephone Encounter (Signed)
Call in #60 with 5 rf 

## 2017-06-20 DIAGNOSIS — H524 Presbyopia: Secondary | ICD-10-CM | POA: Diagnosis not present

## 2017-06-20 DIAGNOSIS — H5203 Hypermetropia, bilateral: Secondary | ICD-10-CM | POA: Diagnosis not present

## 2017-06-20 DIAGNOSIS — H40033 Anatomical narrow angle, bilateral: Secondary | ICD-10-CM | POA: Diagnosis not present

## 2017-06-22 ENCOUNTER — Other Ambulatory Visit: Payer: Self-pay | Admitting: Family Medicine

## 2017-10-20 DIAGNOSIS — Z1231 Encounter for screening mammogram for malignant neoplasm of breast: Secondary | ICD-10-CM | POA: Diagnosis not present

## 2017-11-02 DIAGNOSIS — D1801 Hemangioma of skin and subcutaneous tissue: Secondary | ICD-10-CM | POA: Diagnosis not present

## 2017-11-02 DIAGNOSIS — B078 Other viral warts: Secondary | ICD-10-CM | POA: Diagnosis not present

## 2017-11-02 DIAGNOSIS — L814 Other melanin hyperpigmentation: Secondary | ICD-10-CM | POA: Diagnosis not present

## 2017-11-02 DIAGNOSIS — D225 Melanocytic nevi of trunk: Secondary | ICD-10-CM | POA: Diagnosis not present

## 2017-11-21 DIAGNOSIS — K091 Developmental (nonodontogenic) cysts of oral region: Secondary | ICD-10-CM | POA: Diagnosis not present

## 2017-11-28 ENCOUNTER — Telehealth: Payer: Self-pay | Admitting: Family Medicine

## 2017-11-28 NOTE — Telephone Encounter (Signed)
Copied from CRM [phone]. Topic: Quick Communication - See Telephone Encounter >> Nov 28, 2017  2:48 PM Raquel SarnaHayes, Teresa G wrote: Pt needing to change sleep Rx asap.  Hard to get to sleep

## 2017-11-29 NOTE — Telephone Encounter (Signed)
Calledpt and left a VM to call back to confirm what medication she wanted changed?

## 2017-11-30 DIAGNOSIS — K091 Developmental (nonodontogenic) cysts of oral region: Secondary | ICD-10-CM | POA: Diagnosis not present

## 2017-11-30 MED ORDER — TEMAZEPAM 15 MG PO CAPS: ORAL_CAPSULE | ORAL | 2 refills | Status: DC

## 2017-11-30 NOTE — Telephone Encounter (Signed)
Sent to PCP to advise 

## 2017-11-30 NOTE — Telephone Encounter (Signed)
Rx called in. Patient is aware.  Med list updated.

## 2017-11-30 NOTE — Telephone Encounter (Signed)
Called pt and left a VM to call back.  

## 2017-11-30 NOTE — Telephone Encounter (Signed)
Pt called back, she would like to change from Ativan to Temazepam to help with sleep but only for a month or so. The Ativan is just not working right now.

## 2017-11-30 NOTE — Telephone Encounter (Signed)
Call in Temazepam 15 mg to take 2 tabs qhs, #60 with 2 rf

## 2017-12-06 ENCOUNTER — Other Ambulatory Visit: Payer: Self-pay | Admitting: Family Medicine

## 2017-12-06 DIAGNOSIS — K091 Developmental (nonodontogenic) cysts of oral region: Secondary | ICD-10-CM | POA: Diagnosis not present

## 2017-12-06 NOTE — Telephone Encounter (Signed)
Last OV   Last refilled  

## 2017-12-15 ENCOUNTER — Other Ambulatory Visit: Payer: Self-pay | Admitting: Family Medicine

## 2017-12-16 ENCOUNTER — Telehealth: Payer: Self-pay | Admitting: Family Medicine

## 2017-12-16 NOTE — Telephone Encounter (Signed)
Last OV 06/21/2016   Pt has prior asked to be switched from Ativan to temazepam which Dr. Clent RidgesFry did for the pt.   Pt stated that the temazepam is helping her sleep but she is having awful night terrors pt would like to get back on the ativan.   Sent to PCP for approval. There is a pt call on this as well. FYI duplicated.

## 2017-12-16 NOTE — Telephone Encounter (Signed)
Copied from CRM [id number]. Topic: Inquiry >> Dec 16, 2017 10:26 AM Yvonna Alanisobinson, Andra M wrote: Reason for CRM: Patient called requesting to switch her medication back from Temazepam to LORazepam (ATIVAN) 1 MG tablet. Patient states that the Temazepam is making her "drink". Patient wants to start taking Lorazepam again.       Thank You!!!

## 2017-12-16 NOTE — Telephone Encounter (Signed)
Sent to PCP for approval.  

## 2017-12-16 NOTE — Telephone Encounter (Signed)
This was answered already

## 2018-01-13 DIAGNOSIS — H02054 Trichiasis without entropian left upper eyelid: Secondary | ICD-10-CM | POA: Diagnosis not present

## 2018-01-13 DIAGNOSIS — H15102 Unspecified episcleritis, left eye: Secondary | ICD-10-CM | POA: Diagnosis not present

## 2018-01-16 DIAGNOSIS — H15102 Unspecified episcleritis, left eye: Secondary | ICD-10-CM | POA: Diagnosis not present

## 2018-01-18 DIAGNOSIS — H15102 Unspecified episcleritis, left eye: Secondary | ICD-10-CM | POA: Diagnosis not present

## 2018-01-25 DIAGNOSIS — H15102 Unspecified episcleritis, left eye: Secondary | ICD-10-CM | POA: Diagnosis not present

## 2018-02-23 DIAGNOSIS — M26621 Arthralgia of right temporomandibular joint: Secondary | ICD-10-CM | POA: Diagnosis not present

## 2018-04-05 ENCOUNTER — Telehealth: Payer: Self-pay | Admitting: Family Medicine

## 2018-04-05 NOTE — Telephone Encounter (Signed)
Copied from CRM [id number]. Topic: General - Other >> Apr 05, 2018 11:19 AM Leafy Roobinson, Norma J wrote: Reason for CRM: pt is calling and would like to know if dr fry would increase her lorazepam to next highest dose. Pt has been on current dose for a while.[facility] [address]

## 2018-04-05 NOTE — Telephone Encounter (Signed)
Last OV 06/21/2016   Last refilled 11/30/2017 disp 60 with 2 refills   Sent to PCP to advise pt needs an OV

## 2018-04-06 NOTE — Telephone Encounter (Signed)
Change the Lorazepam to 2 mg, to take 1-2 tabs qhs for sleep, #60 with 5 rf

## 2018-04-07 MED ORDER — LORAZEPAM 2 MG PO TABS: ORAL_TABLET | ORAL | 5 refills | Status: DC

## 2018-04-07 NOTE — Telephone Encounter (Signed)
Rx has been called into pt's pharmacy.  

## 2018-04-07 NOTE — Telephone Encounter (Signed)
Called and spoke with pt. Pt advised and voiced understanding.  

## 2018-04-10 DIAGNOSIS — Z23 Encounter for immunization: Secondary | ICD-10-CM | POA: Diagnosis not present

## 2018-04-11 ENCOUNTER — Other Ambulatory Visit: Payer: Self-pay | Admitting: Family Medicine

## 2018-04-11 NOTE — Telephone Encounter (Signed)
Last OV 06/21/2016   Last refilled 02/01/2017  Disp 60 with 2 refills

## 2018-04-11 NOTE — Telephone Encounter (Signed)
Called and spoke with pt. Pt advised that she will need an OV before Dr. Clent RidgesFry can refill Rx.

## 2018-05-16 ENCOUNTER — Ambulatory Visit (INDEPENDENT_AMBULATORY_CARE_PROVIDER_SITE_OTHER): Payer: BLUE CROSS/BLUE SHIELD | Admitting: Family Medicine

## 2018-05-16 ENCOUNTER — Encounter: Payer: Self-pay | Admitting: Family Medicine

## 2018-05-16 VITALS — BP 100/64 | HR 99 | Temp 98.0°F | Ht 62.25 in | Wt 139.4 lb

## 2018-05-16 DIAGNOSIS — Z0000 Encounter for general adult medical examination without abnormal findings: Secondary | ICD-10-CM | POA: Diagnosis not present

## 2018-05-16 LAB — POC URINALSYSI DIPSTICK (AUTOMATED)
Bilirubin, UA: NEGATIVE
Blood, UA: NEGATIVE
Glucose, UA: NEGATIVE
Ketones, UA: NEGATIVE
Leukocytes, UA: NEGATIVE
Nitrite, UA: NEGATIVE
Protein, UA: NEGATIVE
Spec Grav, UA: 1.015 (ref 1.010–1.025)
Urobilinogen, UA: 0.2 E.U./dL
pH, UA: 7 (ref 5.0–8.0)

## 2018-05-16 LAB — CBC WITH DIFFERENTIAL/PLATELET
Basophils Absolute: 0 10*3/uL (ref 0.0–0.1)
Basophils Relative: 0.7 % (ref 0.0–3.0)
Eosinophils Absolute: 0.1 10*3/uL (ref 0.0–0.7)
Eosinophils Relative: 2.1 % (ref 0.0–5.0)
HCT: 39.8 % (ref 36.0–46.0)
Hemoglobin: 13.5 g/dL (ref 12.0–15.0)
Lymphocytes Relative: 27.3 % (ref 12.0–46.0)
Lymphs Abs: 1.6 10*3/uL (ref 0.7–4.0)
MCHC: 33.8 g/dL (ref 30.0–36.0)
MCV: 86.3 fl (ref 78.0–100.0)
Monocytes Absolute: 0.4 10*3/uL (ref 0.1–1.0)
Monocytes Relative: 6.9 % (ref 3.0–12.0)
Neutro Abs: 3.8 10*3/uL (ref 1.4–7.7)
Neutrophils Relative %: 63 % (ref 43.0–77.0)
Platelets: 259 10*3/uL (ref 150.0–400.0)
RBC: 4.61 Mil/uL (ref 3.87–5.11)
RDW: 13.6 % (ref 11.5–15.5)
WBC: 6 10*3/uL (ref 4.0–10.5)

## 2018-05-16 LAB — HEPATIC FUNCTION PANEL
ALT: 14 U/L (ref 0–35)
AST: 16 U/L (ref 0–37)
Albumin: 4.4 g/dL (ref 3.5–5.2)
Alkaline Phosphatase: 104 U/L (ref 39–117)
Bilirubin, Direct: 0.1 mg/dL (ref 0.0–0.3)
Total Bilirubin: 0.4 mg/dL (ref 0.2–1.2)
Total Protein: 7 g/dL (ref 6.0–8.3)

## 2018-05-16 LAB — BASIC METABOLIC PANEL
BUN: 12 mg/dL (ref 6–23)
CO2: 30 mEq/L (ref 19–32)
Calcium: 10 mg/dL (ref 8.4–10.5)
Chloride: 100 mEq/L (ref 96–112)
Creatinine, Ser: 0.98 mg/dL (ref 0.40–1.20)
GFR: 62.68 mL/min (ref >60.00)
Glucose, Bld: 88 mg/dL (ref 70–99)
Potassium: 4.4 mEq/L (ref 3.5–5.1)
Sodium: 136 mEq/L (ref 135–145)

## 2018-05-16 LAB — LIPID PANEL
Cholesterol: 241 mg/dL — ABNORMAL HIGH (ref 0–200)
HDL: 60.9 mg/dL (ref >39.00)
LDL Cholesterol: 150 mg/dL — ABNORMAL HIGH (ref 0–99)
NonHDL: 180.39
Total CHOL/HDL Ratio: 4
Triglycerides: 151 mg/dL — ABNORMAL HIGH (ref 0.0–149.0)
VLDL: 30.2 mg/dL (ref 0.0–40.0)

## 2018-05-16 LAB — TSH: TSH: 2.34 u[IU]/mL (ref 0.35–4.50)

## 2018-05-16 MED ORDER — KETOCONAZOLE 2 % EX CREA: 1.0000 "application " | TOPICAL_CREAM | Freq: Two times a day (BID) | CUTANEOUS | 2 refills | Status: DC | PRN

## 2018-05-16 MED ORDER — SUMATRIPTAN SUCCINATE 100 MG PO TABS: ORAL_TABLET | ORAL | 6 refills | Status: DC

## 2018-05-16 MED ORDER — CYCLOBENZAPRINE HCL 10 MG PO TABS: 10.0000 mg | ORAL_TABLET | Freq: Three times a day (TID) | ORAL | 2 refills | Status: DC | PRN

## 2018-05-16 NOTE — Progress Notes (Signed)
   Subjective:    Patient ID: Sarah Ford, female    DOB: July 08, 1961, 57 y.o.   MRN: [id number]  HPI Here for a well exam. She feels great except she mentions an itchy rash in both armpits. Her depression and anxiety are well controlled.    Review of Systems  Constitutional: Negative.   HENT: Negative.   Eyes: Negative.   Respiratory: Negative.   Cardiovascular: Negative.   Gastrointestinal: Negative.   Genitourinary: Negative for decreased urine volume, difficulty urinating, dyspareunia, dysuria, enuresis, flank pain, frequency, hematuria, pelvic pain and urgency.  Musculoskeletal: Negative.   Skin: Negative.   Neurological: Negative.   Psychiatric/Behavioral: Negative.        Objective:   Physical Exam  Constitutional: She is oriented to person, place, and time. She appears well-developed and well-nourished. No distress.  HENT:  Head: Normocephalic and atraumatic.  Right Ear: External ear normal.  Left Ear: External ear normal.  Nose: Nose normal.  Mouth/Throat: Oropharynx is clear and moist. No oropharyngeal exudate.  Eyes: Pupils are equal, round, and reactive to light. Conjunctivae and EOM are normal. No scleral icterus.  Neck: Normal range of motion. Neck supple. No JVD present. No thyromegaly present.  Cardiovascular: Normal rate, regular rhythm, normal heart sounds and intact distal pulses. Exam reveals no gallop and no friction rub.  No murmur heard. Pulmonary/Chest: Effort normal and breath sounds normal. No respiratory distress. She has no wheezes. She has no rales. She exhibits no tenderness.  Abdominal: Soft. Bowel sounds are normal. She exhibits no distension and no mass. There is no tenderness. There is no rebound and no guarding.  Musculoskeletal: Normal range of motion. She exhibits no edema or tenderness.  Lymphadenopathy:    She has no cervical adenopathy.  Neurological: She is alert and oriented to person, place, and time. She has normal reflexes. She  displays normal reflexes. No cranial nerve deficit. She exhibits normal muscle tone. Coordination normal.  Skin: Skin is warm and dry. No erythema.  Both axillae have a pink macular rash  Psychiatric: She has a normal mood and affect. Her behavior is normal. Judgment and thought content normal.          Assessment & Plan:  Well exam. We discussed diet and exercise. Get fasting labs. Try ketoconazole cream for the rash.  Gershon CraneStephen Daisie Haft, MD

## 2018-05-23 ENCOUNTER — Telehealth: Payer: Self-pay | Admitting: Family Medicine

## 2018-05-23 NOTE — Telephone Encounter (Signed)
Copied from CRM [id number]. Topic: General - Other >> May 23, 2018  2:58 PM Gerrianne Scale wrote: Reason for CRM: pt calling wanted Dr Clent Ridges to know that she would like to try the cholesterol medicine please send RX to      [facility] [place], [place] - [address] [phone] (Phone) [phone] (Fax)

## 2018-05-23 NOTE — Telephone Encounter (Signed)
Sent to PCP to advise 

## 2018-05-24 MED ORDER — ATORVASTATIN CALCIUM 20 MG PO TABS: 20.0000 mg | ORAL_TABLET | Freq: Every day | ORAL | 3 refills | Status: DC

## 2018-05-24 NOTE — Telephone Encounter (Signed)
Call in Lipitor 20 mg daily, #90 with 3 rf. Recheck labs in 90 days

## 2018-05-24 NOTE — Telephone Encounter (Signed)
Rx has been sent. Called pt and left a detailed VM that RX has been sent into pt's pharmacy

## 2018-06-03 ENCOUNTER — Other Ambulatory Visit: Payer: Self-pay | Admitting: Family Medicine

## 2018-06-05 DIAGNOSIS — Z01419 Encounter for gynecological examination (general) (routine) without abnormal findings: Secondary | ICD-10-CM | POA: Diagnosis not present

## 2018-06-05 DIAGNOSIS — Z6825 Body mass index (BMI) 25.0-25.9, adult: Secondary | ICD-10-CM | POA: Diagnosis not present

## 2018-07-02 ENCOUNTER — Other Ambulatory Visit: Payer: Self-pay | Admitting: Family Medicine

## 2018-07-04 ENCOUNTER — Other Ambulatory Visit: Payer: Self-pay | Admitting: Family Medicine

## 2018-07-06 NOTE — Telephone Encounter (Signed)
Dr. Fry please advise on refills. Thanks  

## 2018-07-07 NOTE — Telephone Encounter (Signed)
Patient calling to check status. States that she is completely out. Please advise.

## 2018-07-10 NOTE — Telephone Encounter (Signed)
Call in #60 with 5 rf 

## 2018-07-10 NOTE — Telephone Encounter (Signed)
NO, she was given a 6 month supply in July

## 2018-07-10 NOTE — Telephone Encounter (Signed)
Patient calling back and says the pharmacy is telling her there is no response from the practice please advise

## 2018-07-10 NOTE — Telephone Encounter (Signed)
Pt called back stating that she was advised by pharmacy to contact office about this refill request. Spoke with Toni Amend at [facility] and informed that their records show that rx called in was for 60 tablets +2 additional refills, not 5.

## 2018-07-10 NOTE — Telephone Encounter (Signed)
Patient called again to check status of refill request. Advised pt there is a discrepancy between what we have in chart and what pharmacy is stating.   Spoke with Judeth Cornfield at [facility] and she states rx was phoned in 04/07/18, #60 with 2 refills. Per chart there are 5 refills. Judeth Cornfield states they did not get this information. Pt has used all 3 rx's and is needing refill.   Dr. Clent Ridges - Please advise. Thanks!

## 2018-07-11 MED ORDER — LORAZEPAM 2 MG PO TABS: ORAL_TABLET | ORAL | 5 refills | Status: DC

## 2018-07-11 NOTE — Telephone Encounter (Signed)
Pt is calling has been waiting for a refill for 4 day

## 2018-07-11 NOTE — Addendum Note (Signed)
Addended by: Kern Reap B on: 07/11/2018 03:50 PM   Modules accepted: Orders

## 2018-07-11 NOTE — Telephone Encounter (Signed)
Rx called in 

## 2018-10-02 DIAGNOSIS — H40033 Anatomical narrow angle, bilateral: Secondary | ICD-10-CM | POA: Diagnosis not present

## 2018-10-02 DIAGNOSIS — H52203 Unspecified astigmatism, bilateral: Secondary | ICD-10-CM | POA: Diagnosis not present

## 2018-10-02 DIAGNOSIS — H04122 Dry eye syndrome of left lacrimal gland: Secondary | ICD-10-CM | POA: Diagnosis not present

## 2018-10-02 DIAGNOSIS — H5203 Hypermetropia, bilateral: Secondary | ICD-10-CM | POA: Diagnosis not present

## 2018-11-06 DIAGNOSIS — Z1231 Encounter for screening mammogram for malignant neoplasm of breast: Secondary | ICD-10-CM | POA: Diagnosis not present

## 2018-12-26 ENCOUNTER — Other Ambulatory Visit: Payer: Self-pay | Admitting: Family Medicine

## 2018-12-27 NOTE — Telephone Encounter (Signed)
Dr. Fry please advise on refill. Thanks  

## 2018-12-27 NOTE — Telephone Encounter (Signed)
Call in #60 with 5 rf 

## 2018-12-28 ENCOUNTER — Telehealth: Payer: Self-pay | Admitting: Family Medicine

## 2018-12-28 NOTE — Telephone Encounter (Signed)
Refill called and left on the Vm for the pharmacy

## 2018-12-28 NOTE — Telephone Encounter (Signed)
Marchelle Folks, with [facility], calling to inquire why this medication was refused.

## 2019-01-01 ENCOUNTER — Other Ambulatory Visit: Payer: Self-pay | Admitting: Family Medicine

## 2019-01-01 NOTE — Telephone Encounter (Signed)
The medication was called to the pharmacy and left on the VM for refills.  This was the second request for the same meds, so this is why this was denied.  I will call the pharmacy once they open to advise them of this.

## 2019-01-01 NOTE — Telephone Encounter (Signed)
Refill has been called to the pharmacy.  

## 2019-01-23 DIAGNOSIS — H10413 Chronic giant papillary conjunctivitis, bilateral: Secondary | ICD-10-CM | POA: Diagnosis not present

## 2019-03-06 ENCOUNTER — Other Ambulatory Visit: Payer: Self-pay | Admitting: Family Medicine

## 2019-03-06 ENCOUNTER — Telehealth: Payer: Self-pay | Admitting: Family Medicine

## 2019-03-06 NOTE — Telephone Encounter (Signed)
Dr. Fry please advise. Thanks  

## 2019-03-06 NOTE — Telephone Encounter (Signed)
Copied from [place] [id number]. Topic: Quick Communication - Rx Refill/Question >> Mar 06, 2019  9:30 AM Reyne Dumas L wrote: Medication: escitalopram (LEXAPRO) 20 MG tablet  Has the patient contacted their pharmacy? Yes - pt left town to deal with a death in the family and forgot this medication.  Wants to know if she can have a 10 day supply immediately sent in.  Pt prefers a call back as soon as possible so she knows where to get medication from and that this can be done. (Agent: If no, request that the patient contact the pharmacy for the refill.) (Agent: If yes, when and what did the pharmacy advise?)  Preferred Pharmacy (with phone number or street name): [facility] in [place], [place] - Phone: [phone]  Agent: Please be advised that RX refills may take up to 3 business days. We ask that you follow-up with your pharmacy.

## 2019-03-07 NOTE — Telephone Encounter (Signed)
Called and spoke with pt and she is aware that rx has been called to the pharmacy and left on the VM

## 2019-03-07 NOTE — Telephone Encounter (Signed)
Dr Fry please advise. thanks 

## 2019-03-07 NOTE — Telephone Encounter (Signed)
Patient calling to check on the request for a 10 day supply of this medication. States that she has gone 2 days without this medication and is starting to feel anxious. Please advise.  [place]

## 2019-03-07 NOTE — Telephone Encounter (Signed)
Call in [place] #30 to her location

## 2019-03-08 NOTE — Telephone Encounter (Signed)
Dr. Fry please advise on refills. Thanks  

## 2019-03-30 DIAGNOSIS — M7581 Other shoulder lesions, right shoulder: Secondary | ICD-10-CM | POA: Diagnosis not present

## 2019-05-25 ENCOUNTER — Other Ambulatory Visit: Payer: Self-pay | Admitting: Family Medicine

## 2019-05-29 DIAGNOSIS — M26601 Right temporomandibular joint disorder, unspecified: Secondary | ICD-10-CM | POA: Diagnosis not present

## 2019-06-12 ENCOUNTER — Telehealth (INDEPENDENT_AMBULATORY_CARE_PROVIDER_SITE_OTHER): Payer: Self-pay | Admitting: Family Medicine

## 2019-06-12 ENCOUNTER — Other Ambulatory Visit: Payer: Self-pay

## 2019-06-12 ENCOUNTER — Telehealth: Payer: Self-pay

## 2019-06-12 ENCOUNTER — Encounter: Payer: Self-pay | Admitting: Family Medicine

## 2019-06-12 DIAGNOSIS — Z0000 Encounter for general adult medical examination without abnormal findings: Secondary | ICD-10-CM

## 2019-06-12 DIAGNOSIS — G43809 Other migraine, not intractable, without status migrainosus: Secondary | ICD-10-CM

## 2019-06-12 DIAGNOSIS — F418 Other specified anxiety disorders: Secondary | ICD-10-CM

## 2019-06-12 DIAGNOSIS — G4700 Insomnia, unspecified: Secondary | ICD-10-CM

## 2019-06-12 MED ORDER — CYCLOBENZAPRINE HCL 10 MG PO TABS: 10.0000 mg | ORAL_TABLET | Freq: Three times a day (TID) | ORAL | 3 refills | Status: DC | PRN

## 2019-06-12 MED ORDER — SUMATRIPTAN SUCCINATE 100 MG PO TABS: ORAL_TABLET | ORAL | 6 refills | Status: DC

## 2019-06-12 MED ORDER — RALOXIFENE HCL 60 MG PO TABS: 60.0000 mg | ORAL_TABLET | Freq: Every day | ORAL | 3 refills | Status: DC

## 2019-06-12 MED ORDER — LORAZEPAM 2 MG PO TABS: ORAL_TABLET | ORAL | 5 refills | Status: DC

## 2019-06-12 NOTE — Progress Notes (Signed)
Virtual Visit via Video Note  I connected with the patient on 06/12/19 at 10:30 AM EDT by a video enabled telemedicine application and verified that I am speaking with the correct person using two identifiers.  Location patient: home Location provider:work or home office Persons participating in the virtual visit: patient, provider  I discussed the limitations of evaluation and management by telemedicine and the availability of in person appointments. The patient expressed understanding and agreed to proceed.   HPI: Here to follow up on issues. She feels great and has no concerns. Her migraines are well controlled. Her depression and anxiety are stable, and she is sleeping well.    ROS: See pertinent positives and negatives per HPI.  Past Medical History:  Diagnosis Date  . Allergy   . Constipation   . Depression   . IBS (irritable bowel syndrome)    sees Dr. Kinnie Scales   . Insomnia   . Osteoporosis   . Routine gynecological examination    sees Dr. Rana Snare     Past Surgical History:  Procedure Laterality Date  . COLONOSCOPY  08/28/2014   per Dr. Kinnie Scales, clear, repeat in 10 yrs   . HERNIA REPAIR     umbilical  . TEMPOROMANDIBULAR JOINT SURGERY  1993  . tummy tuck     dr Shon Hough    Family History  Problem Relation Age of Onset  . Alcohol abuse Unknown   . Arthritis Unknown   . Coronary artery disease Unknown   . Depression Unknown   . Diabetes Unknown   . Hyperlipidemia Unknown   . Stroke Unknown   . Thyroid disease Unknown   . Migraines Unknown   . Cancer Mother   . Breast cancer Mother   . Myelodysplastic syndrome Maternal Aunt      Current Outpatient Medications:  .  atorvastatin (LIPITOR) 20 MG tablet, TAKE ONE TABLET BY MOUTH DAILY, Disp: 90 tablet, Rfl: 2 .  cyclobenzaprine (FLEXERIL) 10 MG tablet, Take 1 tablet (10 mg total) by mouth 3 (three) times daily as needed for muscle spasms., Disp: 90 tablet, Rfl: 2 .  escitalopram (LEXAPRO) 20 MG tablet, TAKE ONE  TABLET BY MOUTH TWICE A DAY, Disp: 180 tablet, Rfl: 3 .  LORazepam (ATIVAN) 2 MG tablet, TAKE 1 TO 2 TABLETS BY MOUTH AT BEDTIME AS NEEDED FOR SLEEP, Disp: 60 tablet, Rfl: 5 .  raloxifene (EVISTA) 60 MG tablet, TAKE ONE TABLET BY MOUTH DAILY, Disp: 90 tablet, Rfl: 3 .  SUMAtriptan (IMITREX) 100 MG tablet, TAKE 1 TABLET BY MOUTH FOR MIGRAINE AS NEEDED AS DIRECTED., Disp: 9 tablet, Rfl: 6  EXAM:  VITALS per patient if applicable:  GENERAL: alert, oriented, appears well and in no acute distress  HEENT: atraumatic, conjunttiva clear, no obvious abnormalities on inspection of external nose and ears  NECK: normal movements of the head and neck  LUNGS: on inspection no signs of respiratory distress, breathing rate appears normal, no obvious gross SOB, gasping or wheezing  CV: no obvious cyanosis  MS: moves all visible extremities without noticeable abnormality  PSYCH/NEURO: pleasant and cooperative, no obvious depression or anxiety, speech and thought processing grossly intact  ASSESSMENT AND PLAN: She is doing well with depression and anxiety and migraines. We will set up fasting labs soon to check her lipids, etc.  Gershon Crane, MD  Discussed the following assessment and plan:  No diagnosis found.     I discussed the assessment and treatment plan with the patient. The patient was provided an opportunity  to ask questions and all were answered. The patient agreed with the plan and demonstrated an understanding of the instructions.   The patient was advised to call back or seek an in-person evaluation if the symptoms worsen or if the condition fails to improve as anticipated.

## 2019-06-12 NOTE — Telephone Encounter (Signed)
Copied from [place] [id number]. Topic: General - Call Back - No Documentation >> Jun 12, 2019 10:42 AM Erick Blinks wrote: Nurse call back, pt is confused of what steps to take next after Virtual appt today. Please advise [phone]

## 2019-06-21 DIAGNOSIS — M26631 Articular disc disorder of right temporomandibular joint: Secondary | ICD-10-CM | POA: Diagnosis not present

## 2019-06-27 ENCOUNTER — Other Ambulatory Visit: Payer: Self-pay

## 2019-06-27 ENCOUNTER — Other Ambulatory Visit (INDEPENDENT_AMBULATORY_CARE_PROVIDER_SITE_OTHER): Payer: BC Managed Care – PPO

## 2019-06-27 DIAGNOSIS — Z0000 Encounter for general adult medical examination without abnormal findings: Secondary | ICD-10-CM | POA: Diagnosis not present

## 2019-06-27 LAB — CBC WITH DIFFERENTIAL/PLATELET
Basophils Absolute: 0 10*3/uL (ref 0.0–0.1)
Basophils Relative: 0.8 % (ref 0.0–3.0)
Eosinophils Absolute: 0.2 10*3/uL (ref 0.0–0.7)
Eosinophils Relative: 3 % (ref 0.0–5.0)
HCT: 40.5 % (ref 36.0–46.0)
Hemoglobin: 13.6 g/dL (ref 12.0–15.0)
Lymphocytes Relative: 32.6 % (ref 12.0–46.0)
Lymphs Abs: 1.9 10*3/uL (ref 0.7–4.0)
MCHC: 33.5 g/dL (ref 30.0–36.0)
MCV: 90.4 fl (ref 78.0–100.0)
Monocytes Absolute: 0.5 10*3/uL (ref 0.1–1.0)
Monocytes Relative: 8.5 % (ref 3.0–12.0)
Neutro Abs: 3.1 10*3/uL (ref 1.4–7.7)
Neutrophils Relative %: 55.1 % (ref 43.0–77.0)
Platelets: 242 10*3/uL (ref 150.0–400.0)
RBC: 4.48 Mil/uL (ref 3.87–5.11)
RDW: 12.9 % (ref 11.5–15.5)
WBC: 5.7 10*3/uL (ref 4.0–10.5)

## 2019-06-27 LAB — POC URINALSYSI DIPSTICK (AUTOMATED)
Glucose, UA: NEGATIVE
Leukocytes, UA: NEGATIVE
Protein, UA: NEGATIVE
Spec Grav, UA: 1.02 (ref 1.010–1.025)
Urobilinogen, UA: 0.2 E.U./dL
pH, UA: 6 (ref 5.0–8.0)

## 2019-06-27 LAB — HEPATIC FUNCTION PANEL
ALT: 21 U/L (ref 0–35)
AST: 19 U/L (ref 0–37)
Albumin: 4.2 g/dL (ref 3.5–5.2)
Alkaline Phosphatase: 117 U/L (ref 39–117)
Bilirubin, Direct: 0.1 mg/dL (ref 0.0–0.3)
Total Bilirubin: 0.3 mg/dL (ref 0.2–1.2)
Total Protein: 6.7 g/dL (ref 6.0–8.3)

## 2019-06-27 LAB — BASIC METABOLIC PANEL
BUN: 14 mg/dL (ref 6–23)
CO2: 29 mEq/L (ref 19–32)
Calcium: 9.7 mg/dL (ref 8.4–10.5)
Chloride: 105 mEq/L (ref 96–112)
Creatinine, Ser: 1.01 mg/dL (ref 0.40–1.20)
GFR: 56.73 mL/min — ABNORMAL LOW (ref >60.00)
Glucose, Bld: 87 mg/dL (ref 70–99)
Potassium: 4.5 mEq/L (ref 3.5–5.1)
Sodium: 141 mEq/L (ref 135–145)

## 2019-06-27 LAB — LIPID PANEL
Cholesterol: 200 mg/dL (ref 0–200)
HDL: 74.3 mg/dL (ref >39.00)
LDL Cholesterol: 109 mg/dL — ABNORMAL HIGH (ref 0–99)
NonHDL: 126.17
Total CHOL/HDL Ratio: 3
Triglycerides: 86 mg/dL (ref 0.0–149.0)
VLDL: 17.2 mg/dL (ref 0.0–40.0)

## 2019-06-27 LAB — TSH: TSH: 3.75 u[IU]/mL (ref 0.35–4.50)

## 2019-09-03 DIAGNOSIS — H2000 Unspecified acute and subacute iridocyclitis: Secondary | ICD-10-CM | POA: Diagnosis not present

## 2019-09-03 DIAGNOSIS — H15103 Unspecified episcleritis, bilateral: Secondary | ICD-10-CM | POA: Diagnosis not present

## 2019-09-07 DIAGNOSIS — H2000 Unspecified acute and subacute iridocyclitis: Secondary | ICD-10-CM | POA: Diagnosis not present

## 2019-09-07 DIAGNOSIS — H15102 Unspecified episcleritis, left eye: Secondary | ICD-10-CM | POA: Diagnosis not present

## 2019-10-22 ENCOUNTER — Encounter: Payer: Self-pay | Admitting: Family Medicine

## 2019-10-23 NOTE — Telephone Encounter (Signed)
Set up an OV to discuss this  

## 2019-10-24 ENCOUNTER — Ambulatory Visit: Payer: BC Managed Care – PPO | Admitting: Family Medicine

## 2019-10-24 ENCOUNTER — Encounter: Payer: Self-pay | Admitting: Family Medicine

## 2019-10-24 ENCOUNTER — Other Ambulatory Visit: Payer: Self-pay

## 2019-10-24 VITALS — BP 130/64 | HR 111 | Temp 97.3°F | Wt 152.6 lb

## 2019-10-24 DIAGNOSIS — K5900 Constipation, unspecified: Secondary | ICD-10-CM | POA: Diagnosis not present

## 2019-10-24 DIAGNOSIS — M545 Low back pain: Secondary | ICD-10-CM

## 2019-10-24 DIAGNOSIS — J3089 Other allergic rhinitis: Secondary | ICD-10-CM | POA: Diagnosis not present

## 2019-10-24 DIAGNOSIS — G8929 Other chronic pain: Secondary | ICD-10-CM

## 2019-10-24 MED ORDER — METHYLPREDNISOLONE ACETATE 80 MG/ML IJ SUSP
80.0000 mg | Freq: Once | INTRAMUSCULAR | Status: AC
  Administered 2019-10-24: 80 mg via INTRAMUSCULAR

## 2019-10-24 MED ORDER — METHYLPREDNISOLONE ACETATE 40 MG/ML IJ SUSP
40.0000 mg | Freq: Once | INTRAMUSCULAR | Status: AC
  Administered 2019-10-24: 40 mg via INTRAMUSCULAR

## 2019-10-24 NOTE — Progress Notes (Signed)
   Subjective:    Patient ID: Sarah Ford, female    DOB: October 26, 1960, 59 y.o.   MRN: [id number]  HPI Here for several issues. First she has dealt with constipation for years, and she often feels bloated and backed up. She admits to drinking little water. She has hadseveral colonoscopies and these have all been clear. She uses [place] sometimes, but then she has a day of diarrhea afterwards. No significant pain, no fever or nausea. Second, she sometimes feels the glands in her neck swell a little, and this often happedns when her allergies act up. Third she has had lower back pain for a few weeks. She takes Ibuprofen and sues heat. Normally she gets regular massages, but with the pandemic she is not comfortable doing this.    Review of Systems  Constitutional: Negative.   HENT: Positive for postnasal drip and sore throat.   Eyes: Negative.   Respiratory: Negative.   Cardiovascular: Negative.   Gastrointestinal: Positive for abdominal distention and constipation. Negative for abdominal pain, anal bleeding, blood in stool, diarrhea, nausea, rectal pain and vomiting.  Musculoskeletal: Positive for back pain.       Objective:   Physical Exam Constitutional:      Appearance: Normal appearance.  HENT:     Right Ear: Tympanic membrane, ear canal and external ear normal.     Left Ear: Tympanic membrane, ear canal and external ear normal.     Nose: Nose normal.     Mouth/Throat:     Pharynx: Oropharynx is clear.  Eyes:     Conjunctiva/sclera: Conjunctivae normal.  Cardiovascular:     Rate and Rhythm: Normal rate and regular rhythm.     Pulses: Normal pulses.     Heart sounds: Normal heart sounds.  Pulmonary:     Effort: Pulmonary effort is normal.     Breath sounds: Normal breath sounds.  Abdominal:     General: Abdomen is flat. Bowel sounds are normal. There is no distension.     Palpations: Abdomen is soft. There is no mass.     Tenderness: There is no abdominal tenderness. There is  no guarding or rebound.     Hernia: No hernia is present.  Musculoskeletal:        General: No tenderness. Normal range of motion.  Lymphadenopathy:     Cervical: No cervical adenopathy.  Neurological:     Mental Status: She is alert.           Assessment & Plan:  For the constipation, I suggested she drink plenty of water every day and get more fiber in her diet. She may use Metamucil daily. For her allergies, try Claritin daily. For the back pain, she is given a shot of DepoMedrol today.  Gershon Crane, MD

## 2019-10-24 NOTE — Addendum Note (Signed)
Addended by: Solon Augusta on: 10/24/2019 02:58 PM   Modules accepted: Orders

## 2019-11-13 ENCOUNTER — Encounter: Payer: Self-pay | Admitting: Family Medicine

## 2019-11-22 ENCOUNTER — Encounter: Payer: Self-pay | Admitting: Family Medicine

## 2019-11-22 DIAGNOSIS — Z1231 Encounter for screening mammogram for malignant neoplasm of breast: Secondary | ICD-10-CM | POA: Diagnosis not present

## 2019-11-22 DIAGNOSIS — Z13820 Encounter for screening for osteoporosis: Secondary | ICD-10-CM | POA: Diagnosis not present

## 2019-11-23 NOTE — Telephone Encounter (Signed)
Set up an in person OV with me to assess this

## 2019-11-30 ENCOUNTER — Other Ambulatory Visit: Payer: Self-pay

## 2019-12-03 ENCOUNTER — Encounter: Payer: Self-pay | Admitting: Family Medicine

## 2019-12-03 ENCOUNTER — Other Ambulatory Visit: Payer: Self-pay

## 2019-12-03 ENCOUNTER — Ambulatory Visit (INDEPENDENT_AMBULATORY_CARE_PROVIDER_SITE_OTHER): Payer: BC Managed Care – PPO | Admitting: Family Medicine

## 2019-12-03 VITALS — BP 120/90 | HR 84 | Temp 97.9°F | Ht 62.25 in | Wt 151.8 lb

## 2019-12-03 DIAGNOSIS — G8929 Other chronic pain: Secondary | ICD-10-CM

## 2019-12-03 DIAGNOSIS — M5441 Lumbago with sciatica, right side: Secondary | ICD-10-CM | POA: Diagnosis not present

## 2019-12-03 DIAGNOSIS — H9203 Otalgia, bilateral: Secondary | ICD-10-CM | POA: Diagnosis not present

## 2019-12-03 MED ORDER — DICLOFENAC SODIUM 75 MG PO TBEC: 75.0000 mg | DELAYED_RELEASE_TABLET | Freq: Two times a day (BID) | ORAL | 5 refills | Status: DC

## 2019-12-03 NOTE — Progress Notes (Signed)
   Subjective:    Patient ID: Sarah Ford, female    DOB: October 12, 1960, 59 y.o.   MRN: [id number]  HPI Here for several issues. First both of her ears have been hurting on and off for several weeks. No pain on chewing. Her spring times allergies have been flaring up. Also for about 3 months she has had some intermittent low back pain that radiates down the back of the right thigh. No numbness or tingling or weakness. Aleve helps a little. Today is actually a good day for this.    Review of Systems  Constitutional: Negative.   HENT: Positive for congestion, ear pain and sinus pressure. Negative for hearing loss, postnasal drip, sinus pain, sneezing, sore throat, tinnitus and trouble swallowing.   Eyes: Negative.   Respiratory: Negative.   Cardiovascular: Negative.   Musculoskeletal: Positive for back pain.       Objective:   Physical Exam Constitutional:      Appearance: Normal appearance.  HENT:     Head: Normocephalic and atraumatic.     Right Ear: Tympanic membrane, ear canal and external ear normal.     Left Ear: Tympanic membrane, ear canal and external ear normal.     Nose: Nose normal.     Mouth/Throat:     Pharynx: Oropharynx is clear.  Eyes:     Conjunctiva/sclera: Conjunctivae normal.  Cardiovascular:     Rate and Rhythm: Normal rate and regular rhythm.     Pulses: Normal pulses.     Heart sounds: Normal heart sounds.  Pulmonary:     Effort: Pulmonary effort is normal.     Breath sounds: Normal breath sounds.  Musculoskeletal:     Comments: She is mildly tender in the lower back over the spine and over the right sciatic notch. ROM is full. Negative SLR.   Lymphadenopathy:     Cervical: No cervical adenopathy.  Neurological:     Mental Status: She is alert.           Assessment & Plan:  Her ear pain is likely the result of some eustachian tube dysfunction related to her allergies. She will try Flonase every day. 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 She can try Diclofenac as needed.  Gershon Crane, MD

## 2019-12-04 ENCOUNTER — Encounter: Payer: Self-pay | Admitting: Family Medicine

## 2019-12-05 MED ORDER — TEMAZEPAM 30 MG PO CAPS: 30.0000 mg | ORAL_CAPSULE | Freq: Every evening | ORAL | 0 refills | Status: DC | PRN

## 2019-12-05 NOTE — Telephone Encounter (Signed)
Done

## 2019-12-06 ENCOUNTER — Encounter: Payer: Self-pay | Admitting: Family Medicine

## 2019-12-17 ENCOUNTER — Encounter: Payer: Self-pay | Admitting: Family Medicine

## 2019-12-18 MED ORDER — LORAZEPAM 2 MG PO TABS: ORAL_TABLET | ORAL | 5 refills | Status: DC

## 2019-12-18 NOTE — Telephone Encounter (Signed)
Done

## 2019-12-18 NOTE — Telephone Encounter (Signed)
Last filled 06/12/2019 Last OV 12/03/2019  Ok to fill?

## 2019-12-21 ENCOUNTER — Encounter: Payer: Self-pay | Admitting: Family Medicine

## 2019-12-31 DIAGNOSIS — M545 Low back pain: Secondary | ICD-10-CM | POA: Diagnosis not present

## 2019-12-31 DIAGNOSIS — M5416 Radiculopathy, lumbar region: Secondary | ICD-10-CM | POA: Diagnosis not present

## 2020-01-08 ENCOUNTER — Other Ambulatory Visit: Payer: Self-pay

## 2020-01-09 ENCOUNTER — Ambulatory Visit (INDEPENDENT_AMBULATORY_CARE_PROVIDER_SITE_OTHER): Payer: BC Managed Care – PPO | Admitting: Family Medicine

## 2020-01-09 ENCOUNTER — Encounter: Payer: Self-pay | Admitting: Family Medicine

## 2020-01-09 VITALS — BP 118/66 | HR 80 | Temp 98.0°F | Wt 149.8 lb

## 2020-01-09 DIAGNOSIS — S60812A Abrasion of left wrist, initial encounter: Secondary | ICD-10-CM | POA: Diagnosis not present

## 2020-01-09 NOTE — Progress Notes (Signed)
  Subjective:     Patient ID: Sarah Ford, female   DOB: [birth year], 59 y.o.   MRN: [id number]  HPI   Sarah Ford is seen with wrist abrasion involving her left wrist.  About a week ago she tripped and abraded this on a brick wall.  There is no bony tenderness.  She had fairly deep abrasion on the volar surface of the left wrist.  She initially used some hydrogen peroxide and has used some Neosporin since then.  Last tetanus 2014.  She is seen today because she had concern of possible infection.  She states that it "pusses up" each day.  She has had a little bit of yellowish exudate.  No surrounding erythema.  No fevers or chills.  Past Medical History:  Diagnosis Date  . Allergy   . Constipation   . Depression   . IBS (irritable bowel syndrome)    sees Dr. Kinnie Scales   . Insomnia   . Osteoporosis   . Routine gynecological examination    sees Dr. Rana Snare    Past Surgical History:  Procedure Laterality Date  . COLONOSCOPY  08/28/2014   per Dr. Kinnie Scales, clear, repeat in 10 yrs   . HERNIA REPAIR     umbilical  . TEMPOROMANDIBULAR JOINT SURGERY  1993  . tummy tuck     dr Shon Hough    reports that she has been smoking. She has never used smokeless tobacco. She reports current alcohol use. She reports that she does not use drugs. family history includes Alcohol abuse in an other family member; Arthritis in an other family member; Breast cancer in her mother; Cancer in her mother; Coronary artery disease in an other family member; Depression in an other family member; Diabetes in an other family member; Hyperlipidemia in an other family member; Migraines in an other family member; Myelodysplastic syndrome in her maternal aunt; Stroke in an other family member; Thyroid disease in an other family member. Allergies  Allergen Reactions  . Codeine   . Hydrocodone-Acetaminophen   . Tramadol     itching     Review of Systems  Constitutional: Negative for chills and fever.       Objective:   Physical Exam Vitals reviewed.  Constitutional:      Appearance: Normal appearance.  Cardiovascular:     Rate and Rhythm: Normal rate and regular rhythm.  Skin:    Comments: She has abrasion volar surface left wrist.  This is approximately one by one half centimeter dimension There is no surrounding erythema.  No purulent drainage noted.  No necrosis.  No warmth.  Neurological:     Mental Status: She is alert.        Assessment:     Abrasion left wrist with no signs of cellulitis or secondary infection at this time.  Normal exudative material in the wound    Plan:     -Leave off Neosporin or Polysporin -Continue to clean daily with soap and water -Use some topical Vaseline once or twice daily to prevent excessive drying of the wound until this fully heals -Follow-up promptly for any signs of secondary infection  Kristian Covey MD [facility]

## 2020-01-09 NOTE — Patient Instructions (Signed)
Continue to clean daily with soap and water  Consider topical vaseline once or twice daily  Watch for any increased redness, warmth or other concerns.

## 2020-01-15 ENCOUNTER — Ambulatory Visit: Payer: BC Managed Care – PPO | Admitting: Family Medicine

## 2020-01-17 DIAGNOSIS — Z6826 Body mass index (BMI) 26.0-26.9, adult: Secondary | ICD-10-CM | POA: Diagnosis not present

## 2020-01-17 DIAGNOSIS — Z01419 Encounter for gynecological examination (general) (routine) without abnormal findings: Secondary | ICD-10-CM | POA: Diagnosis not present

## 2020-01-23 ENCOUNTER — Ambulatory Visit: Payer: BC Managed Care – PPO | Admitting: Family Medicine

## 2020-01-24 ENCOUNTER — Other Ambulatory Visit: Payer: Self-pay

## 2020-01-25 ENCOUNTER — Ambulatory Visit: Payer: BC Managed Care – PPO | Admitting: Family Medicine

## 2020-01-25 ENCOUNTER — Encounter: Payer: Self-pay | Admitting: Family Medicine

## 2020-01-25 VITALS — BP 110/62 | HR 100 | Temp 97.8°F | Wt 152.8 lb

## 2020-01-25 DIAGNOSIS — G8929 Other chronic pain: Secondary | ICD-10-CM | POA: Diagnosis not present

## 2020-01-25 DIAGNOSIS — M5441 Lumbago with sciatica, right side: Secondary | ICD-10-CM | POA: Diagnosis not present

## 2020-01-25 MED ORDER — METHYLPREDNISOLONE ACETATE 40 MG/ML IJ SUSP
40.0000 mg | Freq: Once | INTRAMUSCULAR | Status: AC
Start: 2020-01-25 — End: 2020-01-25
  Administered 2020-01-25: 40 mg via INTRAMUSCULAR

## 2020-01-25 MED ORDER — METHYLPREDNISOLONE ACETATE 80 MG/ML IJ SUSP
80.0000 mg | Freq: Once | INTRAMUSCULAR | Status: AC
  Administered 2020-01-25: 80 mg via INTRAMUSCULAR

## 2020-01-25 NOTE — Addendum Note (Signed)
Addended by: Solon Augusta on: 01/25/2020 03:10 PM   Modules accepted: Orders
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
## 2020-01-31 ENCOUNTER — Encounter: Payer: Self-pay | Admitting: Family Medicine

## 2020-02-01 MED ORDER — SUMATRIPTAN SUCCINATE 100 MG PO TABS: ORAL_TABLET | ORAL | 6 refills | Status: DC

## 2020-02-12 ENCOUNTER — Encounter: Payer: Self-pay | Admitting: Family Medicine

## 2020-02-12 MED ORDER — ATORVASTATIN CALCIUM 20 MG PO TABS: 20.0000 mg | ORAL_TABLET | Freq: Every day | ORAL | 2 refills | Status: DC

## 2020-02-13 ENCOUNTER — Encounter: Payer: Self-pay | Admitting: Family Medicine

## 2020-02-19 ENCOUNTER — Encounter: Payer: Self-pay | Admitting: Family Medicine

## 2020-02-20 ENCOUNTER — Encounter: Payer: Self-pay | Admitting: Family Medicine

## 2020-02-20 NOTE — Telephone Encounter (Signed)
They are similar but Xanax is a little stronger

## 2020-02-20 NOTE — Telephone Encounter (Signed)
Patient is referring to the lorazepam and xanax from previous messages.

## 2020-02-20 NOTE — Telephone Encounter (Signed)
She is already on the maximum dose of Lorazepam. I suggest we change to something like Xanax, if she agrees.

## 2020-02-22 MED ORDER — ALPRAZOLAM 1 MG PO TABS
1.0000 mg | ORAL_TABLET | Freq: Three times a day (TID) | ORAL | 0 refills | Status: DC | PRN
Start: 2020-02-22 — End: 2020-06-20

## 2020-02-22 NOTE — Telephone Encounter (Signed)
I sent in some Xanax to try. Stop the Lorazepam

## 2020-02-22 NOTE — Telephone Encounter (Signed)
See the other mychart message.  °

## 2020-03-26 DIAGNOSIS — M810 Age-related osteoporosis without current pathological fracture: Secondary | ICD-10-CM | POA: Diagnosis not present

## 2020-03-26 DIAGNOSIS — K589 Irritable bowel syndrome without diarrhea: Secondary | ICD-10-CM | POA: Diagnosis not present

## 2020-03-26 DIAGNOSIS — G43909 Migraine, unspecified, not intractable, without status migrainosus: Secondary | ICD-10-CM | POA: Diagnosis not present

## 2020-03-26 DIAGNOSIS — Z87891 Personal history of nicotine dependence: Secondary | ICD-10-CM | POA: Diagnosis not present

## 2020-03-26 DIAGNOSIS — E785 Hyperlipidemia, unspecified: Secondary | ICD-10-CM | POA: Diagnosis not present

## 2020-04-09 DIAGNOSIS — M810 Age-related osteoporosis without current pathological fracture: Secondary | ICD-10-CM | POA: Diagnosis not present

## 2020-04-22 ENCOUNTER — Encounter: Payer: Self-pay | Admitting: Family Medicine

## 2020-04-23 NOTE — Telephone Encounter (Signed)
I would get tested for Covid

## 2020-05-09 DIAGNOSIS — M810 Age-related osteoporosis without current pathological fracture: Secondary | ICD-10-CM | POA: Diagnosis not present

## 2020-05-21 ENCOUNTER — Other Ambulatory Visit: Payer: Self-pay | Admitting: Family Medicine

## 2020-05-21 ENCOUNTER — Encounter: Payer: Self-pay | Admitting: Family Medicine

## 2020-05-22 DIAGNOSIS — M810 Age-related osteoporosis without current pathological fracture: Secondary | ICD-10-CM | POA: Diagnosis not present

## 2020-06-17 ENCOUNTER — Encounter: Payer: Self-pay | Admitting: Family Medicine

## 2020-06-20 MED ORDER — LORAZEPAM 2 MG PO TABS: ORAL_TABLET | ORAL | 5 refills | Status: DC

## 2020-06-20 NOTE — Telephone Encounter (Signed)
I sent in the Lorazepam

## 2020-07-29 ENCOUNTER — Encounter: Payer: Self-pay | Admitting: Family Medicine

## 2020-08-11 ENCOUNTER — Encounter: Payer: Self-pay | Admitting: Family Medicine

## 2020-08-13 ENCOUNTER — Encounter: Payer: Self-pay | Admitting: Family Medicine

## 2020-08-13 MED ORDER — ESCITALOPRAM OXALATE 20 MG PO TABS: 20.0000 mg | ORAL_TABLET | Freq: Two times a day (BID) | ORAL | 0 refills | Status: DC

## 2020-08-18 DIAGNOSIS — M810 Age-related osteoporosis without current pathological fracture: Secondary | ICD-10-CM | POA: Diagnosis not present

## 2020-08-25 DIAGNOSIS — H04123 Dry eye syndrome of bilateral lacrimal glands: Secondary | ICD-10-CM | POA: Diagnosis not present

## 2020-08-25 DIAGNOSIS — H5213 Myopia, bilateral: Secondary | ICD-10-CM | POA: Diagnosis not present

## 2020-08-25 DIAGNOSIS — H52203 Unspecified astigmatism, bilateral: Secondary | ICD-10-CM | POA: Diagnosis not present

## 2020-09-03 ENCOUNTER — Encounter: Payer: Self-pay | Admitting: Family Medicine

## 2020-09-03 ENCOUNTER — Other Ambulatory Visit: Payer: Self-pay

## 2020-09-03 ENCOUNTER — Ambulatory Visit (INDEPENDENT_AMBULATORY_CARE_PROVIDER_SITE_OTHER): Payer: BC Managed Care – PPO | Admitting: Family Medicine

## 2020-09-03 VITALS — BP 112/62 | HR 93 | Temp 98.3°F | Resp 18 | Ht 63.0 in | Wt 147.4 lb

## 2020-09-03 DIAGNOSIS — Z0000 Encounter for general adult medical examination without abnormal findings: Secondary | ICD-10-CM

## 2020-09-03 MED ORDER — DICLOFENAC SODIUM 75 MG PO TBEC: 75.0000 mg | DELAYED_RELEASE_TABLET | Freq: Two times a day (BID) | ORAL | 3 refills | Status: DC

## 2020-09-03 MED ORDER — CYCLOBENZAPRINE HCL 10 MG PO TABS
10.0000 mg | ORAL_TABLET | Freq: Three times a day (TID) | ORAL | 3 refills | Status: DC | PRN
Start: 2020-09-03 — End: 2021-03-11

## 2020-09-03 MED ORDER — DIAZEPAM 10 MG PO TABS: 10.0000 mg | ORAL_TABLET | Freq: Two times a day (BID) | ORAL | 5 refills | Status: DC | PRN

## 2020-09-03 NOTE — Addendum Note (Signed)
Addended by: Leonette Nutting on: 09/03/2020 11:57 AM   Modules accepted: Orders

## 2020-09-03 NOTE — Progress Notes (Signed)
   Subjective:    Patient ID: Sarah Ford, female    DOB: March 27, 1961, 59 y.o.   MRN: [id number]  HPI    Review of Systems     Objective:   Physical Exam        Assessment & Plan:

## 2020-09-03 NOTE — Progress Notes (Signed)
   Subjective:    Patient ID: Sarah Ford, female    DOB: [birth year], 59 y.o.   MRN: [id number]  HPI Here for a well exam. She feels great.    Review of Systems  Constitutional: Negative.   HENT: Negative.   Eyes: Negative.   Respiratory: Negative.   Cardiovascular: Negative.   Gastrointestinal: Negative.   Genitourinary: Negative for decreased urine volume, difficulty urinating, dyspareunia, dysuria, enuresis, flank pain, frequency, hematuria, pelvic pain and urgency.  Musculoskeletal: Negative.   Skin: Negative.   Neurological: Negative.   Psychiatric/Behavioral: Negative.        Objective:   Physical Exam Constitutional:      General: She is not in acute distress.    Appearance: She is well-developed and well-nourished.  HENT:     Head: Normocephalic and atraumatic.     Right Ear: External ear normal.     Left Ear: External ear normal.     Nose: Nose normal.     Mouth/Throat:     Mouth: Oropharynx is clear and moist.     Pharynx: No oropharyngeal exudate.  Eyes:     General: No scleral icterus.    Extraocular Movements: EOM normal.     Conjunctiva/sclera: Conjunctivae normal.     Pupils: Pupils are equal, round, and reactive to light.  Neck:     Thyroid: No thyromegaly.     Vascular: No JVD.  Cardiovascular:     Rate and Rhythm: Normal rate and regular rhythm.     Pulses: Intact distal pulses.     Heart sounds: Normal heart sounds. No murmur heard. No friction rub. No gallop.   Pulmonary:     Effort: Pulmonary effort is normal. No respiratory distress.     Breath sounds: Normal breath sounds. No wheezing or rales.  Chest:     Chest wall: No tenderness.  Abdominal:     General: Bowel sounds are normal. There is no distension.     Palpations: Abdomen is soft. There is no mass.     Tenderness: There is no abdominal tenderness. There is no guarding or rebound.  Musculoskeletal:        General: No tenderness or edema. Normal range of motion.     Cervical  back: Normal range of motion and neck supple.  Lymphadenopathy:     Cervical: No cervical adenopathy.  Skin:    General: Skin is warm and dry.     Findings: No erythema or rash.  Neurological:     Mental Status: She is alert and oriented to person, place, and time.     Cranial Nerves: No cranial nerve deficit.     Motor: No abnormal muscle tone.     Coordination: Coordination normal.     Deep Tendon Reflexes: Reflexes are normal and symmetric. Reflexes normal.  Psychiatric:        Mood and Affect: Mood and affect normal.        Behavior: Behavior normal.        Thought Content: Thought content normal.        Judgment: Judgment normal.           Assessment & Plan:  Well exam. We discussed diet and exercise. Get fasting labs.  Gershon Crane, MD

## 2020-09-04 ENCOUNTER — Encounter: Payer: Self-pay | Admitting: Family Medicine

## 2020-09-04 LAB — BASIC METABOLIC PANEL WITH GFR
BUN: 13 mg/dL (ref 7–25)
CO2: 26 mmol/L (ref 20–32)
Calcium: 9.8 mg/dL (ref 8.6–10.4)
Chloride: 104 mmol/L (ref 98–110)
Creat: 1.02 mg/dL (ref 0.50–1.05)
GFR, Est African American: 71 mL/min/{1.73_m2} (ref >=60)
GFR, Est Non African American: 61 mL/min/{1.73_m2} (ref >=60)
Glucose, Bld: 91 mg/dL (ref 65–99)
Potassium: 4.6 mmol/L (ref 3.5–5.3)
Sodium: 138 mmol/L (ref 135–146)

## 2020-09-04 LAB — LIPID PANEL
Cholesterol: 245 mg/dL — ABNORMAL HIGH (ref <200)
HDL: 62 mg/dL (ref >=50)
LDL Cholesterol (Calc): 156 mg/dL (calc) — ABNORMAL HIGH
Non-HDL Cholesterol (Calc): 183 mg/dL (calc) — ABNORMAL HIGH (ref <130)
Total CHOL/HDL Ratio: 4 (calc) (ref <5.0)
Triglycerides: 143 mg/dL (ref <150)

## 2020-09-04 LAB — CBC WITH DIFFERENTIAL/PLATELET
Absolute Monocytes: 468 cells/uL (ref 200–950)
Basophils Absolute: 50 cells/uL (ref 0–200)
Basophils Relative: 0.9 %
Eosinophils Absolute: 231 cells/uL (ref 15–500)
Eosinophils Relative: 4.2 %
HCT: 44.4 % (ref 35.0–45.0)
Hemoglobin: 15 g/dL (ref 11.7–15.5)
Lymphs Abs: 1403 cells/uL (ref 850–3900)
MCH: 30.1 pg (ref 27.0–33.0)
MCHC: 33.8 g/dL (ref 32.0–36.0)
MCV: 89 fL (ref 80.0–100.0)
MPV: 10.8 fL (ref 7.5–12.5)
Monocytes Relative: 8.5 %
Neutro Abs: 3350 cells/uL (ref 1500–7800)
Neutrophils Relative %: 60.9 %
Platelets: 296 10*3/uL (ref 140–400)
RBC: 4.99 10*6/uL (ref 3.80–5.10)
RDW: 12.4 % (ref 11.0–15.0)
Total Lymphocyte: 25.5 %
WBC: 5.5 10*3/uL (ref 3.8–10.8)

## 2020-09-04 LAB — HEPATIC FUNCTION PANEL
AG Ratio: 1.7 (calc) (ref 1.0–2.5)
ALT: 24 U/L (ref 6–29)
AST: 23 U/L (ref 10–35)
Albumin: 4.6 g/dL (ref 3.6–5.1)
Alkaline phosphatase (APISO): 85 U/L (ref 37–153)
Bilirubin, Direct: 0.1 mg/dL (ref 0.0–0.2)
Globulin: 2.7 g/dL (calc) (ref 1.9–3.7)
Indirect Bilirubin: 0.4 mg/dL (calc) (ref 0.2–1.2)
Total Bilirubin: 0.5 mg/dL (ref 0.2–1.2)
Total Protein: 7.3 g/dL (ref 6.1–8.1)

## 2020-09-04 LAB — TSH: TSH: 3.3 mIU/L (ref 0.40–4.50)

## 2020-09-16 ENCOUNTER — Ambulatory Visit: Payer: BC Managed Care – PPO | Admitting: Family Medicine

## 2020-09-16 ENCOUNTER — Encounter: Payer: Self-pay | Admitting: Family Medicine

## 2020-09-16 ENCOUNTER — Other Ambulatory Visit: Payer: Self-pay

## 2020-09-16 VITALS — BP 118/78 | HR 86 | Wt 156.0 lb

## 2020-09-16 DIAGNOSIS — J3089 Other allergic rhinitis: Secondary | ICD-10-CM

## 2020-09-16 DIAGNOSIS — E785 Hyperlipidemia, unspecified: Secondary | ICD-10-CM

## 2020-09-16 NOTE — Progress Notes (Signed)
   Subjective:    Patient ID: Sarah Ford, female    DOB: [birth year], 59 y.o.   MRN: [id number]  HPI Here to discuss her recent lab results. These were all normal except the lipid panel. This looked about the same as it usually does. The HDL is excellent at 62 and the LDL is high at 156. She watches her diet closely, and she takes Lipitor 20 mg daily. She also complains of a few days of itchy ears and a scratchy throat. No fever or cough.    Review of Systems  Constitutional: Negative.   HENT: Positive for congestion. Negative for ear pain, facial swelling, postnasal drip and sinus pain.   Eyes: Negative.   Respiratory: Negative.        Objective:   Physical Exam Constitutional:      Appearance: Normal appearance.  HENT:     Right Ear: Tympanic membrane normal.     Left Ear: Tympanic membrane normal.     Nose: Nose normal.     Mouth/Throat:     Pharynx: Oropharynx is clear.  Eyes:     Conjunctiva/sclera: Conjunctivae normal.  Pulmonary:     Effort: Pulmonary effort is normal.     Breath sounds: Normal breath sounds.  Lymphadenopathy:     Cervical: No cervical adenopathy.  Neurological:     Mental Status: She is alert.           Assessment & Plan:  Her dyslipidemia is stable. She has some allergies flaring up now, so she can use Claritin as needed.  Gershon Crane, MD

## 2020-10-23 ENCOUNTER — Encounter: Payer: Self-pay | Admitting: Family Medicine

## 2020-10-24 NOTE — Telephone Encounter (Signed)
Come see me next week for this

## 2020-10-28 ENCOUNTER — Ambulatory Visit (INDEPENDENT_AMBULATORY_CARE_PROVIDER_SITE_OTHER): Payer: Self-pay | Admitting: Family Medicine

## 2020-10-28 ENCOUNTER — Other Ambulatory Visit: Payer: Self-pay

## 2020-10-28 ENCOUNTER — Encounter: Payer: Self-pay | Admitting: Family Medicine

## 2020-10-28 VITALS — BP 118/74 | HR 112 | Temp 98.8°F | Wt 161.0 lb

## 2020-10-28 DIAGNOSIS — M25511 Pain in right shoulder: Secondary | ICD-10-CM

## 2020-10-28 MED ORDER — METHYLPREDNISOLONE 4 MG PO TBPK: ORAL_TABLET | ORAL | 0 refills | Status: DC

## 2020-10-28 NOTE — Progress Notes (Signed)
   Subjective:    Patient ID: Sarah Ford, female    DOB: January 18, 1961, 61 y.o.   MRN: [id number]  HPI Here for 3 weeks of mild pain in the right posterior shoulder. No recent trauma. She has not been exercising or perfoming any unusual activities. No neck or back pain. She gets some relief with Diclofenac.    Review of Systems  Constitutional: Negative.   Respiratory: Negative.   Cardiovascular: Negative.   Musculoskeletal: Positive for arthralgias.       Objective:   Physical Exam Constitutional:      Appearance: Normal appearance.  Cardiovascular:     Rate and Rhythm: Normal rate and regular rhythm.     Pulses: Normal pulses.     Heart sounds: Normal heart sounds.  Pulmonary:     Effort: Pulmonary effort is normal.     Breath sounds: Normal breath sounds.  Musculoskeletal:     Comments: The right shoulder has full ROM. No crepitus. She is mildly tender in the posterior shoulder around the proximal triceps tendon   Neurological:     Mental Status: She is alert.           Assessment & Plan:  Triceps tendonitis. Rest and apply moist heat. Given a Medrol dose pack. Recheck as needed.  Gershon Crane, MD

## 2020-10-28 NOTE — Progress Notes (Signed)
hhbbb

## 2020-11-23 ENCOUNTER — Encounter: Payer: Self-pay | Admitting: Family Medicine

## 2020-11-24 ENCOUNTER — Other Ambulatory Visit: Payer: Self-pay

## 2020-11-24 MED ORDER — ESCITALOPRAM OXALATE 20 MG PO TABS: 20.0000 mg | ORAL_TABLET | Freq: Two times a day (BID) | ORAL | 0 refills | Status: DC

## 2020-11-26 ENCOUNTER — Encounter: Payer: Self-pay | Admitting: Family Medicine

## 2020-11-26 ENCOUNTER — Other Ambulatory Visit: Payer: Self-pay

## 2020-11-26 ENCOUNTER — Ambulatory Visit (INDEPENDENT_AMBULATORY_CARE_PROVIDER_SITE_OTHER): Payer: BLUE CROSS/BLUE SHIELD | Admitting: Family Medicine

## 2020-11-26 VITALS — BP 118/78 | HR 83 | Temp 98.9°F | Wt 161.0 lb

## 2020-11-26 DIAGNOSIS — R600 Localized edema: Secondary | ICD-10-CM

## 2020-11-26 LAB — BASIC METABOLIC PANEL
BUN: 13 mg/dL (ref 6–23)
CO2: 27 mEq/L (ref 19–32)
Calcium: 9.6 mg/dL (ref 8.4–10.5)
Chloride: 103 mEq/L (ref 96–112)
Creatinine, Ser: 1.04 mg/dL (ref 0.40–1.20)
GFR: 59.76 mL/min — ABNORMAL LOW (ref >60.00)
Glucose, Bld: 80 mg/dL (ref 70–99)
Potassium: 4.6 mEq/L (ref 3.5–5.1)
Sodium: 138 mEq/L (ref 135–145)

## 2020-11-26 NOTE — Progress Notes (Signed)
   Subjective:    Patient ID: Sarah Ford, female    DOB: 1961-08-26, 60 y.o.   MRN: [id number]  HPI Here for one week of swelling in both hands and both feet. No recent change in diet. No SOB or chest pain. She was here a month ago for shoulder pain, and we gave her a Medrol dose pack. The shoulder feels better.   Review of Systems  Constitutional: Negative.   Respiratory: Negative.   Cardiovascular: Positive for leg swelling. Negative for chest pain and palpitations.       Objective:   Physical Exam Constitutional:      Appearance: Normal appearance. She is not ill-appearing.  Cardiovascular:     Rate and Rhythm: Normal rate and regular rhythm.     Pulses: Normal pulses.     Heart sounds: Normal heart sounds.  Pulmonary:     Effort: Pulmonary effort is normal.     Breath sounds: Normal breath sounds.  Musculoskeletal:     Comments: Trace edema in both ankles   Neurological:     Mental Status: She is alert.           Assessment & Plan:  She has some fluid retention as a side effect of recently taking steroids. We will get a BMET to check her renal function. I reassured her this should resolve on its own over the next week or two.  Gershon Crane, MD

## 2020-11-28 NOTE — Telephone Encounter (Signed)
Noted. I do not think this indicates a problem. Sometimes antihistamines or allergy meds can cause this. Be sure to drink lots of water

## 2020-12-10 ENCOUNTER — Ambulatory Visit: Payer: BLUE CROSS/BLUE SHIELD | Admitting: Family Medicine

## 2020-12-10 ENCOUNTER — Encounter: Payer: Self-pay | Admitting: Family Medicine

## 2020-12-10 ENCOUNTER — Other Ambulatory Visit: Payer: Self-pay

## 2020-12-10 ENCOUNTER — Ambulatory Visit (INDEPENDENT_AMBULATORY_CARE_PROVIDER_SITE_OTHER): Payer: BLUE CROSS/BLUE SHIELD | Admitting: Family Medicine

## 2020-12-10 VITALS — BP 128/88 | HR 85 | Temp 98.4°F | Wt 159.0 lb

## 2020-12-10 DIAGNOSIS — R609 Edema, unspecified: Secondary | ICD-10-CM | POA: Diagnosis not present

## 2020-12-10 DIAGNOSIS — M2550 Pain in unspecified joint: Secondary | ICD-10-CM

## 2020-12-10 DIAGNOSIS — R600 Localized edema: Secondary | ICD-10-CM

## 2020-12-10 MED ORDER — FUROSEMIDE 20 MG PO TABS: 20.0000 mg | ORAL_TABLET | Freq: Every day | ORAL | 3 refills | Status: DC

## 2020-12-10 NOTE — Progress Notes (Signed)
   Subjective:    Patient ID: Sarah Ford, female    DOB: [birth year], 60 y.o.   MRN: [id number]  HPI Here for worsening of swelling in the hands and wrists and ankles and feet. No SOB. She now has some stiffness and pains in both wrists and forearms. No rashes or fevers. We saw her on 11-26-20 after this had first started in the ankles and we felt it may have been the effect of her taking a Medrol dose pack shortly before that. This is very unlikely the case now that over a month has passed.    Review of Systems  Constitutional: Positive for fatigue.  Respiratory: Negative.   Cardiovascular: Positive for leg swelling. Negative for chest pain and palpitations.  Gastrointestinal: Negative.   Genitourinary: Negative.   Musculoskeletal: Positive for arthralgias and joint swelling.       Objective:   Physical Exam Constitutional:      Appearance: Normal appearance. She is not ill-appearing.  Cardiovascular:     Rate and Rhythm: Normal rate and regular rhythm.     Heart sounds: Normal heart sounds.  Pulmonary:     Effort: Pulmonary effort is normal.     Breath sounds: Normal breath sounds.  Musculoskeletal:     Comments: 1+ edema in both ankles and in both wrists. No erythema or warmth. Both wrists are mildly tender with full ROM  Neurological:     Mental Status: She is alert.           Assessment & Plan:  She has peripheral edema and some painful joints. She can try Lasix 20 mg daily. Get labs today for renal function, inflammatory markers, RF, etc. Refer to Rheumatology. Gershon Crane, MD

## 2020-12-11 ENCOUNTER — Ambulatory Visit: Payer: BLUE CROSS/BLUE SHIELD | Admitting: Family Medicine

## 2020-12-11 LAB — CBC WITH DIFFERENTIAL/PLATELET
Basophils Absolute: 0.1 10*3/uL (ref 0.0–0.1)
Basophils Relative: 1 % (ref 0.0–3.0)
Eosinophils Absolute: 0.2 10*3/uL (ref 0.0–0.7)
Eosinophils Relative: 3.4 % (ref 0.0–5.0)
HCT: 42.2 % (ref 36.0–46.0)
Hemoglobin: 14.3 g/dL (ref 12.0–15.0)
Lymphocytes Relative: 28.7 % (ref 12.0–46.0)
Lymphs Abs: 1.8 10*3/uL (ref 0.7–4.0)
MCHC: 33.8 g/dL (ref 30.0–36.0)
MCV: 90.7 fl (ref 78.0–100.0)
Monocytes Absolute: 0.6 10*3/uL (ref 0.1–1.0)
Monocytes Relative: 9.9 % (ref 3.0–12.0)
Neutro Abs: 3.7 10*3/uL (ref 1.4–7.7)
Neutrophils Relative %: 57 % (ref 43.0–77.0)
Platelets: 291 10*3/uL (ref 150.0–400.0)
RBC: 4.66 Mil/uL (ref 3.87–5.11)
RDW: 14.3 % (ref 11.5–15.5)
WBC: 6.4 10*3/uL (ref 4.0–10.5)

## 2020-12-11 LAB — RHEUMATOID FACTOR: Rheumatoid fact SerPl-aCnc: <14 IU/mL (ref <14)

## 2020-12-11 LAB — HEPATIC FUNCTION PANEL
ALT: 38 U/L — ABNORMAL HIGH (ref 0–35)
AST: 33 U/L (ref 0–37)
Albumin: 4.6 g/dL (ref 3.5–5.2)
Alkaline Phosphatase: 87 U/L (ref 39–117)
Bilirubin, Direct: 0.1 mg/dL (ref 0.0–0.3)
Total Bilirubin: 0.4 mg/dL (ref 0.2–1.2)
Total Protein: 7.1 g/dL (ref 6.0–8.3)

## 2020-12-11 LAB — BASIC METABOLIC PANEL
BUN: 13 mg/dL (ref 6–23)
CO2: 27 mEq/L (ref 19–32)
Calcium: 9.5 mg/dL (ref 8.4–10.5)
Chloride: 103 mEq/L (ref 96–112)
Creatinine, Ser: 1.05 mg/dL (ref 0.40–1.20)
GFR: 59.06 mL/min — ABNORMAL LOW (ref >60.00)
Glucose, Bld: 89 mg/dL (ref 70–99)
Potassium: 4.3 mEq/L (ref 3.5–5.1)
Sodium: 140 mEq/L (ref 135–145)

## 2020-12-11 LAB — C-REACTIVE PROTEIN: CRP: <1 mg/dL (ref 0.5–20.0)

## 2020-12-11 LAB — ANA: Anti Nuclear Antibody (ANA): NEGATIVE

## 2020-12-11 LAB — T3, FREE: T3, Free: 2.6 pg/mL (ref 2.3–4.2)

## 2020-12-11 LAB — T4, FREE: Free T4: 0.6 ng/dL (ref 0.60–1.60)

## 2020-12-11 LAB — TSH: TSH: 3.93 u[IU]/mL (ref 0.35–4.50)

## 2020-12-11 LAB — SEDIMENTATION RATE: Sed Rate: 7 mm/hr (ref 0–30)

## 2020-12-15 ENCOUNTER — Encounter: Payer: Self-pay | Admitting: Family Medicine

## 2020-12-16 ENCOUNTER — Telehealth: Payer: Self-pay

## 2020-12-16 NOTE — Telephone Encounter (Signed)
Reviewed lab results with pt verbalized understanding 

## 2020-12-17 ENCOUNTER — Encounter: Payer: Self-pay | Admitting: Family Medicine

## 2020-12-17 NOTE — Telephone Encounter (Signed)
She can also take Ibuprofen 600-800 mg every 6 hours as needed. Hopefully she can see Rheumatology soon

## 2020-12-25 ENCOUNTER — Encounter: Payer: Self-pay | Admitting: Family Medicine

## 2020-12-26 MED ORDER — LORAZEPAM 2 MG PO TABS: ORAL_TABLET | ORAL | 5 refills | Status: DC

## 2020-12-26 NOTE — Telephone Encounter (Signed)
Done

## 2020-12-30 ENCOUNTER — Encounter: Payer: Self-pay | Admitting: Family Medicine

## 2020-12-31 NOTE — Telephone Encounter (Signed)
Have her come in for another exam

## 2021-01-01 ENCOUNTER — Encounter: Payer: Self-pay | Admitting: Family Medicine

## 2021-01-01 ENCOUNTER — Ambulatory Visit (INDEPENDENT_AMBULATORY_CARE_PROVIDER_SITE_OTHER): Payer: BLUE CROSS/BLUE SHIELD | Admitting: Family Medicine

## 2021-01-01 ENCOUNTER — Other Ambulatory Visit: Payer: Self-pay

## 2021-01-01 VITALS — BP 102/76 | HR 89 | Temp 98.5°F | Wt 159.0 lb

## 2021-01-01 DIAGNOSIS — R609 Edema, unspecified: Secondary | ICD-10-CM

## 2021-01-01 MED ORDER — FUROSEMIDE 40 MG PO TABS: 40.0000 mg | ORAL_TABLET | Freq: Every day | ORAL | 3 refills | Status: DC

## 2021-01-01 NOTE — Progress Notes (Signed)
   Subjective:    Patient ID: Sarah Ford, female    DOB: September 04, 1961, 60 y.o.   MRN: [id number]  HPI Here for continued swelling in the hands and feet. Her abdomen feels swollen. No cough or SOB. Several weeks ago we did extensive lab work which was all completely normal. Her weight has been stable for the past 3 months. She has been taking Lasix 20 mg daily, but this has not made much of a difference. She has an appt to see Dr. Corliss Skains (Rheumatology) again in June. I asked how often she takes Diclofenac and she said only once or twice a week.    Review of Systems  Constitutional: Negative.   Respiratory: Negative.   Cardiovascular: Positive for leg swelling. Negative for chest pain and palpitations.       Objective:   Physical Exam Constitutional:      Appearance: Normal appearance.  Cardiovascular:     Rate and Rhythm: Normal rate and regular rhythm.     Pulses: Normal pulses.     Heart sounds: Normal heart sounds.  Pulmonary:     Effort: Pulmonary effort is normal.     Breath sounds: Normal breath sounds.  Musculoskeletal:     Comments: Trace edema in the hands and feet   Neurological:     Mental Status: She is alert.           Assessment & Plan:  Edema of uncertain etiology. We will increase the Lasix to 40 mg a day. Hopefully she can see Dr. Corliss Skains a little sooner.  Gershon Crane, MD

## 2021-01-12 ENCOUNTER — Ambulatory Visit: Payer: BLUE CROSS/BLUE SHIELD | Admitting: Family Medicine

## 2021-01-21 ENCOUNTER — Encounter: Payer: Self-pay | Admitting: Family Medicine

## 2021-01-21 NOTE — Telephone Encounter (Signed)
Have her come in again

## 2021-01-21 NOTE — Progress Notes (Signed)
Office Visit Note  Patient: Sarah Ford             Date of Birth: [birth year]           MRN: [id number]             PCP: Laurey Morale, MD Referring: Laurey Morale, MD Visit Date: 02/04/2021 Occupation: @GUAROCC @  Subjective:  Pain and swelling in joints.   History of Present Illness: Sarah Ford is a 60 y.o. female seen in consultation per request of Dr. Sarajane Jews.  Patient was seen by me last in 2014.  She started having redness in her eyes in 1995.  In 2014 she was seen by Dr. Gillian Scarce who diagnosed her with episcleritis and treated her with steroid eyedrops.  She also complained of bilateral SI joint pain since 2010 for which she was seeing Dr. Alfonso Ramus and had some cortisone injections.  She had an episode of Planter fasciitis in 2010 with no recurrence.  At the time she was evaluated by me and all her autoimmune work-up was negative.   Patient returns today after her last visit in 2014.  She states she has ongoing episodes of episcleritis about once every 6 weeks.  She has been followed by Dr. Gillian Scarce and gets topical prednisone drops.  She states she has not had an episode in the last 2 months.  In February 2022 she woke up with the right arm pain which was persistent.  She states she was evaluated by Dr. Friday and had a cortisone injection to her right shoulder joint.  The shoulder joint improved after the cortisone injection.  She states few days later she developed pain and swelling in her bilateral hands and her bilateral feet which has been persistent since then.  Couple of weeks ago she developed pain and swelling in her left knee joint for which she was seen by Dr. Sarajane Jews and was diagnosed with Baker's cyst.  The symptoms resolved now.  She had labs done by Dr. Sarajane Jews which were unremarkable.  She was sent to me for further evaluation.  None of the other joints are painful.  She is gravida 4, para 2, miscarriages 2. She was also diagnosed with osteoporosis many years ago.  She was treated with oral  bisphosphonates and Evista in the past.  In September 2021 she was seen by [facility] endocrinologist and was given IV Reclast infusion.  Activities of Daily Living:  Patient reports morning stiffness for 0 minutes.   Patient Reports nocturnal pain.  Difficulty dressing/grooming: Denies Difficulty climbing stairs: Denies Difficulty getting out of chair: Denies Difficulty using hands for taps, buttons, cutlery, and/or writing: Reports  Review of Systems  Constitutional: Positive for fatigue. Negative for night sweats, weight gain and weight loss.  HENT: Negative for mouth sores, trouble swallowing, trouble swallowing, mouth dryness and nose dryness.   Eyes: Positive for pain, redness and dryness. Negative for itching and visual disturbance.  Respiratory: Negative for cough, shortness of breath and difficulty breathing.   Cardiovascular: Negative for chest pain, palpitations, hypertension, irregular heartbeat and swelling in legs/feet.  Gastrointestinal: Positive for constipation. Negative for blood in stool and diarrhea.  Endocrine: Negative for increased urination.  Genitourinary: Negative for difficulty urinating and vaginal dryness.  Musculoskeletal: Positive for arthralgias, joint pain, joint swelling, myalgias, muscle tenderness and myalgias. Negative for muscle weakness and morning stiffness.  Skin: Negative for color change, rash, hair loss, redness, skin tightness, ulcers and sensitivity to sunlight.  Allergic/Immunologic: Negative for susceptible to  infections.  Neurological: Positive for numbness. Negative for dizziness, headaches, memory loss, night sweats and weakness.  Hematological: Positive for bruising/bleeding tendency. Negative for swollen glands.  Psychiatric/Behavioral: Positive for sleep disturbance. Negative for depressed mood and confusion. The patient is nervous/anxious.     PMFS History:  Patient Active Problem List   Diagnosis Date Noted  . Arthralgia 12/10/2020  .  Peripheral edema 12/10/2020  . Chronic right-sided low back pain with right-sided sciatica 01/25/2020  . Constipation 10/24/2019  . Environmental and seasonal allergies 10/24/2019  . Migraine variant 03/09/2010  . APHASIA 03/09/2010  . INSOMNIA, CHRONIC 05/29/2007  . Depression with anxiety 05/29/2007  . Osteoporosis 05/29/2007    Past Medical History:  Diagnosis Date  . Allergy   . Constipation   . Depression   . IBS (irritable bowel syndrome)    sees Dr. Earlean Shawl   . Insomnia   . Osteoporosis    gets Reclast per [facility] Endocrinology   . Routine gynecological examination    sees Dr. Corinna Capra     Family History  Problem Relation Age of Onset  . Cancer Mother   . Breast cancer Mother   . Thyroid disease Mother   . Hypertension Mother   . High Cholesterol Mother   . Myelodysplastic syndrome Maternal Aunt   . Heart failure Father   . Kidney failure Father   . Diabetes Father   . Hypertension Father   . High Cholesterol Father   . Osteoporosis Sister   . Alcohol abuse Sister   . Mental illness Sister   . Healthy Son   . Allergies Daughter   . Depression Daughter    Past Surgical History:  Procedure Laterality Date  . COLONOSCOPY  08/28/2014   per Dr. Earlean Shawl, clear, repeat in 10 yrs   . DILATION AND CURETTAGE OF UTERUS     x2  . FOOT SURGERY Right    bone spur  . HERNIA REPAIR     umbilical  . TEMPOROMANDIBULAR JOINT SURGERY  1993  . tummy tuck     dr Towanda Malkin   Social History   Social History Narrative  . Not on file   Immunization History  Administered Date(s) Administered  . Influenza Inj Mdck Quad With Preservative 06/12/2019  . Influenza Split 07/26/2011, 07/05/2012  . Influenza Whole 07/05/2007  . Influenza,inj,Quad PF,6+ Mos 07/26/2014, 05/30/2015, 06/21/2016, 07/10/2018  . Influenza-Unspecified 06/20/2017, 07/04/2020  . PFIZER(Purple Top)SARS-COV-2 Vaccination 11/30/2019, 12/22/2019, 07/17/2020  . Tdap 12/14/2012  . Zoster Recombinat (Shingrix)  04/10/2018     Objective: Vital Signs: BP 118/82 (BP Location: Right Arm, Patient Position: Sitting, Cuff Size: Normal)   Pulse 85   Ht 5' 2.5" (1.588 m)   Wt 158 lb 6.4 oz (71.8 kg)   BMI 28.51 kg/m    Physical Exam Vitals and nursing note reviewed.  Constitutional:      Appearance: She is well-developed.  HENT:     Head: Normocephalic and atraumatic.  Eyes:     Conjunctiva/sclera: Conjunctivae normal.  Cardiovascular:     Rate and Rhythm: Normal rate and regular rhythm.     Heart sounds: Normal heart sounds.  Pulmonary:     Effort: Pulmonary effort is normal.     Breath sounds: Normal breath sounds.  Abdominal:     General: Bowel sounds are normal.     Palpations: Abdomen is soft.  Musculoskeletal:     Cervical back: Normal range of motion.  Lymphadenopathy:     Cervical: No cervical adenopathy.  Skin:  General: Skin is warm and dry.     Capillary Refill: Capillary refill takes less than 2 seconds.  Neurological:     Mental Status: She is alert and oriented to person, place, and time.  Psychiatric:        Behavior: Behavior normal.      Musculoskeletal Exam: C-spine was in good range of motion.  She has thoracolumbar scoliosis.  Shoulder joints, elbow joints were in good range of motion.  She has synovitis of her bilateral wrist joint and bilateral tenosynovitis.  There is no MCP swelling but she had tenderness and swelling over the PIP joints with synovitis and some of the joints as described below.  Hip joints and knee joints with good range of motion without any warmth swelling or effusion.  She has tenderness over ankle joints and also tenderness on palpation over left second MTP joint and PIP joint.  No synovitis was noted in her toes.  CDAI Exam: CDAI Score: 14.8  Patient Global: 4 mm; Provider Global: 4 mm Swollen: 7 ; Tender: 11  Joint Exam 02/04/2021      Right  Left  Wrist  Swollen Tender  Swollen Tender  IP     Swollen Tender  PIP 2  Swollen Tender      PIP 3  Swollen Tender  Swollen Tender  PIP 4     Swollen Tender  Ankle   Tender   Tender  MTP 2      Tender  PIP 2 (toe)      Tender     Investigation: No additional findings.  Imaging: No results found.  Recent Labs: Lab Results  Component Value Date   WBC 6.4 12/10/2020   HGB 14.3 12/10/2020   PLT 291.0 12/10/2020   NA 140 12/10/2020   K 4.3 12/10/2020   CL 103 12/10/2020   CO2 27 12/10/2020   GLUCOSE 89 12/10/2020   BUN 13 12/10/2020   CREATININE 1.05 12/10/2020   BILITOT 0.4 12/10/2020   ALKPHOS 87 12/10/2020   AST 33 12/10/2020   ALT 38 (H) 12/10/2020   PROT 7.1 12/10/2020   ALBUMIN 4.6 12/10/2020   CALCIUM 9.5 12/10/2020   GFRAA 71 09/03/2020  October 30, 2012 ANA negative, ENA negative, RF negative, anti-CCP negative pan-ANCA negative, ESR 8, ACE normal, HLA-B27 negative, TB Gold negative  Speciality Comments: No specialty comments available.  Procedures:  No procedures performed Allergies: Hydrocodone-acetaminophen and Tramadol   Assessment / Plan:     Visit Diagnoses: Seronegative rheumatoid arthritis ([place]) -patient gives history of ongoing pain and swelling in multiple joints for the last 3 months.  She states her symptoms a started with the right shoulder joint pain and discomfort.  She responded to cortisone injection.  She has been having pain and swelling in her bilateral wrist and her hands.  She also has discomfort in her ankles and her feet.  She had an episode of Baker's cyst in her left knee joint couple of weeks ago which resolved.  All of the autoimmune work-up done by Dr. Sarajane Jews was negative.  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  Different treatment options for the treatment of rheumatoid arthritis were discussed.  Indications side effects contraindications were reviewed.  Patient wants to proceed with methotrexate.  I believe methotrexate will be right choice for her with a history of episcleritis.  She would  benefit from subcutaneous methotrexate if approved by insurance.  I will obtain baseline labs and  chest x-ray today.  Patient is postmenopausal and does not drink any alcohol.  Handout was given and consent was taken today.  The plan is to start her on methotrexate 15 mg subcu once labs are available.  We will increase the dose to 20 mg subcutaneous if the labs are stable.  Then we will monitor labs every 3 months.  She will also need folic acid 2 mg p.o. daily.  12/10/20: ANA negative, RF<14, ESR 7, CRP<1  Pain in both hands -she complains of pain and discomfort in her bilateral hands.  Synovitis was noted in her wrist joints and her PIP joints as described above.  Plan: XR Hand 2 View Right, XR Hand 2 View Left, x-rays were consistent with osteoarthritis.  Sedimentation rate, Cyclic citrul peptide antibody, IgG, 14-3-3 eta Protein  Pain in both feet -she had tenderness on palpation of bilateral ankles and also some of her toes.  No synovitis was noted.  Plan: XR Foot 2 Views Right, XR Foot 2 Views Left.  X-rays were consistent with osteoarthritis.  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  She has been under care of Dr. Prudencio Burly since 2014 and according to the patient she has been having episodes of episcleritis about every 6 weeks.  She has been treated with topical steroid drops.  She has not had an episode in the last 2 months.  High risk medication use -recent CBC was normal.  CMP showed elevated LFTs.  Plan: AST, ALT, Hepatitis B core antibody, IgM, Hepatitis B surface antigen, Hepatitis C antibody, HIV Antibody (routine testing w rflx), QuantiFERON-TB Gold Plus, Serum protein electrophoresis with reflex, IgG, IgA, IgM, DG Chest 2 View  Chronic right-sided low back pain with right-sided sciatica-she is currently not having any discomfort.  Idiopathic scoliosis of thoracolumbar region  History of osteoporosis - She is on Reclast IV( first dose 05/2020) by [facility]  endocrinilogist.  Treated with Fosamax, Actonel and Evista in the past.  Dyslipidemia-she is on Lipitor.  Peripheral edema  Depression with anxiety-she is on Lexapro.  Migraine variant-she takes Flexeril as needed.  Environmental and seasonal allergies  Orders: Orders Placed This Encounter  Procedures  . XR Hand 2 View Right  . XR Hand 2 View Left  . XR Foot 2 Views Right  . XR Foot 2 Views Left  . DG Chest 2 View  . AST  . ALT  . Sedimentation rate  . Cyclic citrul peptide antibody, IgG  . 14-3-3 eta Protein  . Hepatitis B core antibody, IgM  . Hepatitis B surface antigen  . Hepatitis C antibody  . HIV Antibody (routine testing w rflx)  . QuantiFERON-TB Gold Plus  . Serum protein electrophoresis with reflex  . IgG, IgA, IgM   No orders of the defined types were placed in this encounter.    Follow-Up Instructions: Return for Rheumatoid arthritis.   Bo Merino, MD  Note - This record has been created using Editor, commissioning.  Chart creation errors have been sought, but may not always  have been located. Such creation errors do not reflect on  the standard of medical care.

## 2021-01-21 NOTE — Telephone Encounter (Signed)
No, Imitrex would not help her hands

## 2021-01-21 NOTE — Telephone Encounter (Signed)
Spoke with pt state that he wanted to update you that he went to [facility] for the pain in her knee and they ran some blood work on her which came back same but diagnosed her with Engineer, production cyst which they prescribed Ibuprofen 800 mg TID, pt state that this helped the pain. Pt stated that she also had an appointment with her Rheumatologist 2 weeks ago. Pt would like to know if Imitrex will help her numbness/swelling  of her hands, please advise

## 2021-02-04 ENCOUNTER — Ambulatory Visit: Payer: Self-pay

## 2021-02-04 ENCOUNTER — Ambulatory Visit: Payer: BLUE CROSS/BLUE SHIELD | Admitting: Rheumatology

## 2021-02-04 ENCOUNTER — Encounter: Payer: Self-pay | Admitting: Family Medicine

## 2021-02-04 ENCOUNTER — Telehealth: Payer: Self-pay | Admitting: Pharmacist

## 2021-02-04 ENCOUNTER — Other Ambulatory Visit: Payer: Self-pay

## 2021-02-04 ENCOUNTER — Encounter: Payer: Self-pay | Admitting: Rheumatology

## 2021-02-04 VITALS — BP 118/82 | HR 85 | Ht 62.5 in | Wt 158.4 lb

## 2021-02-04 DIAGNOSIS — M79671 Pain in right foot: Secondary | ICD-10-CM

## 2021-02-04 DIAGNOSIS — M79672 Pain in left foot: Secondary | ICD-10-CM

## 2021-02-04 DIAGNOSIS — E785 Hyperlipidemia, unspecified: Secondary | ICD-10-CM

## 2021-02-04 DIAGNOSIS — Z79899 Other long term (current) drug therapy: Secondary | ICD-10-CM

## 2021-02-04 DIAGNOSIS — M79642 Pain in left hand: Secondary | ICD-10-CM | POA: Diagnosis not present

## 2021-02-04 DIAGNOSIS — M2550 Pain in unspecified joint: Secondary | ICD-10-CM

## 2021-02-04 DIAGNOSIS — J3089 Other allergic rhinitis: Secondary | ICD-10-CM

## 2021-02-04 DIAGNOSIS — M0600 Rheumatoid arthritis without rheumatoid factor, unspecified site: Secondary | ICD-10-CM

## 2021-02-04 DIAGNOSIS — R609 Edema, unspecified: Secondary | ICD-10-CM

## 2021-02-04 DIAGNOSIS — F418 Other specified anxiety disorders: Secondary | ICD-10-CM

## 2021-02-04 DIAGNOSIS — M5441 Lumbago with sciatica, right side: Secondary | ICD-10-CM

## 2021-02-04 DIAGNOSIS — M79641 Pain in right hand: Secondary | ICD-10-CM

## 2021-02-04 DIAGNOSIS — H15103 Unspecified episcleritis, bilateral: Secondary | ICD-10-CM | POA: Diagnosis not present

## 2021-02-04 DIAGNOSIS — G43809 Other migraine, not intractable, without status migrainosus: Secondary | ICD-10-CM

## 2021-02-04 DIAGNOSIS — Z8739 Personal history of other diseases of the musculoskeletal system and connective tissue: Secondary | ICD-10-CM

## 2021-02-04 DIAGNOSIS — G8929 Other chronic pain: Secondary | ICD-10-CM

## 2021-02-04 DIAGNOSIS — M4125 Other idiopathic scoliosis, thoracolumbar region: Secondary | ICD-10-CM

## 2021-02-04 NOTE — Telephone Encounter (Signed)
Yes schedule her to get a Prevnar 13 shot

## 2021-02-04 NOTE — Patient Instructions (Signed)
Methotrexate tablets What is this medicine? METHOTREXATE (METH oh TREX ate) is a chemotherapy drug used to treat cancer including breast cancer, leukemia, and lymphoma. This medicine can also be used to treat psoriasis and certain kinds of arthritis. This medicine may be used for other purposes; ask your health care provider or pharmacist if you have questions. COMMON BRAND NAME(S): Rheumatrex, Trexall What should I tell my health care provider before I take this medicine? They need to know if you have any of these conditions:  fluid in the stomach area or lungs  if you often drink alcohol  infection or immune system problems  kidney disease or on hemodialysis  liver disease  low blood counts, like low white cell, platelet, or red cell counts  lung disease  radiation therapy  stomach ulcers  ulcerative colitis  an unusual or allergic reaction to methotrexate, other medicines, foods, dyes, or preservatives  pregnant or trying to get pregnant  breast-feeding How should I use this medicine? Take this medicine by mouth with a glass of water. Follow the directions on the prescription label. Take your medicine at regular intervals. Do not take it more often than directed. Do not stop taking except on your doctor's advice. Make sure you know why you are taking this medicine and how often you should take it. If this medicine is used for a condition that is not cancer, like arthritis or psoriasis, it should be taken weekly, NOT daily. Taking this medicine more often than directed can cause serious side effects, even death. Talk to your healthcare provider about safe handling and disposal of this medicine. You may need to take special precautions. Talk to your pediatrician regarding the use of this medicine in children. While this drug may be prescribed for selected conditions, precautions do apply. Overdosage: If you think you have taken too much of this medicine contact a poison control  center or emergency room at once. NOTE: This medicine is only for you. Do not share this medicine with others. What if I miss a dose? If you miss a dose, talk with your doctor or health care professional. Do not take double or extra doses. What may interact with this medicine? Do not take this medicine with any of the following medications:  acitretin This medicine may also interact with the following medication:  aspirin and aspirin-like medicines including salicylates  azathioprine  certain antibiotics like penicillins, tetracycline, and chloramphenicol  certain medicines that treat or prevent blood clots like warfarin, apixaban, dabigatran, and rivaroxaban  certain medicines for stomach problems like esomeprazole, omeprazole, pantoprazole  cyclosporine  dapsone  diuretics  gold  hydroxychloroquine  live virus vaccines  medicines for infection like acyclovir, adefovir, amphotericin B, bacitracin, cidofovir, foscarnet, ganciclovir, gentamicin, pentamidine, vancomycin  mercaptopurine  NSAIDs, medicines for pain and inflammation, like ibuprofen or naproxen  other cytotoxic agents  pamidronate  pemetrexed  penicillamine  phenylbutazone  phenytoin  probenecid  pyrimethamine  retinoids such as isotretinoin and tretinoin  steroid medicines like prednisone or cortisone  sulfonamides like sulfasalazine and trimethoprim/sulfamethoxazole  theophylline  zoledronic acid This list may not describe all possible interactions. Give your health care provider a list of all the medicines, herbs, non-prescription drugs, or dietary supplements you use. Also tell them if you smoke, drink alcohol, or use illegal drugs. Some items may interact with your medicine. What should I watch for while using this medicine? Avoid alcoholic drinks. This medicine can make you more sensitive to the sun. Keep out of the sun.   If you cannot avoid being in the sun, wear protective clothing  and use sunscreen. Do not use sun lamps or tanning beds/booths. You may need blood work done while you are taking this medicine. Call your doctor or health care professional for advice if you get a fever, chills or sore throat, or other symptoms of a cold or flu. Do not treat yourself. This drug decreases your body's ability to fight infections. Try to avoid being around people who are sick. This medicine may increase your risk to bruise or bleed. Call your doctor or health care professional if you notice any unusual bleeding. Be careful brushing or flossing your teeth or using a toothpick because you may get an infection or bleed more easily. If you have any dental work done, tell your dentist you are receiving this medicine. Check with your doctor or health care professional if you get an attack of severe diarrhea, nausea and vomiting, or if you sweat a lot. The loss of too much body fluid can make it dangerous for you to take this medicine. Talk to your doctor about your risk of cancer. You may be more at risk for certain types of cancers if you take this medicine. Do not become pregnant while taking this medicine or for 6 months after stopping it. Women should inform their health care provider if they wish to become pregnant or think they might be pregnant. Men should not father a child while taking this medicine and for 3 months after stopping it. There is potential for serious harm to an unborn child. Talk to your health care provider for more information. Do not breast-feed an infant while taking this medicine or for 1 week after stopping it. This medicine may make it more difficult to get pregnant or father a child. Talk to your health care provider if you are concerned about your fertility. What side effects may I notice from receiving this medicine? Side effects that you should report to your doctor or health care professional as soon as possible:  allergic reactions like skin rash, itching or  hives, swelling of the face, lips, or tongue  breathing problems or shortness of breath  diarrhea  dry, nonproductive cough  low blood counts - this medicine may decrease the number of white blood cells, red blood cells and platelets. You may be at increased risk for infections and bleeding.  mouth sores  redness, blistering, peeling or loosening of the skin, including inside the mouth  signs of infection - fever or chills, cough, sore throat, pain or trouble passing urine  signs and symptoms of bleeding such as bloody or black, tarry stools; red or dark-brown urine; spitting up blood or brown material that looks like coffee grounds; red spots on the skin; unusual bruising or bleeding from the eye, gums, or nose  signs and symptoms of kidney injury like trouble passing urine or change in the amount of urine  signs and symptoms of liver injury like dark yellow or brown urine; general ill feeling or flu-like symptoms; light-colored stools; loss of appetite; nausea; right upper belly pain; unusually weak or tired; yellowing of the eyes or skin Side effects that usually do not require medical attention (report to your doctor or health care professional if they continue or are bothersome):  dizziness  hair loss  tiredness  upset stomach  vomiting This list may not describe all possible side effects. Call your doctor for medical advice about side effects. You may report side effects to  FDA at 1-800-FDA-1088. Where should I keep my medicine? Keep out of the reach of children and pets. Store at room temperature between 20 and 25 degrees C (68 and 77 degrees F). Protect from light. Get rid of any unused medicine after the expiration date. Talk to your health care provider about how to dispose of unused medicine. Special directions may apply. NOTE: This sheet is a summary. It may not cover all possible information. If you have questions about this medicine, talk to your doctor, pharmacist,  or health care provider.  2021 Elsevier/Gold Standard (2020-04-07 10:40:39)  Standing Labs We placed an order today for your standing lab work.   Please have your standing labs drawn in 2 weeks x 2 and then every 3 months  If possible, please have your labs drawn 2 weeks prior to your appointment so that the provider can discuss your results at your appointment.  We have open lab daily Monday through Thursday from 1:30-4:30 PM and Friday from 1:30-4:00 PM at the office of Dr. Pollyann Savoy, [facility].   Please be advised, all patients with office appointments requiring lab work will take precedents over walk-in lab work.  If possible, please come for your lab work on Monday and Friday afternoons, as you may experience shorter wait times. The office is located at [address], [address], [place], [place] [address] No appointment is necessary.   Labs are drawn by [facility]. Please bring your co-pay at the time of your lab draw.  You may receive a bill from [facility] for your lab work.  If you wish to have your labs drawn at another location, please call the office 24 hours in advance to send orders.  If you have any questions regarding directions or hours of operation,  please call [phone].   As a reminder, please drink plenty of water prior to coming for your lab work. Thanks!  Vaccines You are taking a medication(s) that can suppress your immune system.  The following immunizations are recommended: . Flu annually . Covid-19  . Pneumonia (Pneumovax 23 and Prevnar 13 spaced at least 1 year apart) . Shingrix (after age 75)  Please check with your PCP to make sure you are up to date.

## 2021-02-04 NOTE — Telephone Encounter (Signed)
Submitted a Prior Authorization request to [facility] for RASUVO via CoverMyMeds. Will update once we receive a response.   Key: [id number]

## 2021-02-04 NOTE — Progress Notes (Signed)
Pharmacy Note  Subjective: Patient presents today to [facility] for follow up office visit. Patient seen by the pharmacist for counseling on methotrexate for rheumatoid arthritis (likely but labs are pending today). Naive to all therapies.  Objective: CBC    Component Value Date/Time   WBC 6.4 12/10/2020 1528   RBC 4.66 12/10/2020 1528   HGB 14.3 12/10/2020 1528   HCT 42.2 12/10/2020 1528   PLT 291.0 12/10/2020 1528   MCV 90.7 12/10/2020 1528   MCH 30.1 09/03/2020 1151   MCHC 33.8 12/10/2020 1528   RDW 14.3 12/10/2020 1528   LYMPHSABS 1.8 12/10/2020 1528   MONOABS 0.6 12/10/2020 1528   EOSABS 0.2 12/10/2020 1528   BASOSABS 0.1 12/10/2020 1528    CMP     Component Value Date/Time   NA 140 12/10/2020 1528   K 4.3 12/10/2020 1528   CL 103 12/10/2020 1528   CO2 27 12/10/2020 1528   GLUCOSE 89 12/10/2020 1528   BUN 13 12/10/2020 1528   CREATININE 1.05 12/10/2020 1528   CREATININE 1.02 09/03/2020 1151   CALCIUM 9.5 12/10/2020 1528   PROT 7.1 12/10/2020 1528   ALBUMIN 4.6 12/10/2020 1528   AST 33 12/10/2020 1528   ALT 38 (H) 12/10/2020 1528   ALKPHOS 87 12/10/2020 1528   BILITOT 0.4 12/10/2020 1528   GFRNONAA 61 09/03/2020 1151   GFRAA 71 09/03/2020 1151    Baseline Immunosuppressant Therapy Labs All immunosuppressive labs drawn today.   SPEP Serum Protein Electrophoresis Latest Ref Rng & Units 12/10/2020  Total Protein 6.0 - 8.3 g/dL 7.1   Chest-xray:  Order placed today  Contraception: post-menopausal  Alcohol use: rarely and plans to stop alcohol use  Assessment/Plan:   Patient was counseled on the purpose, proper use, and adverse effects of methotrexate including nausea, infection, and signs and symptoms of pneumonitis. Discussed that there is the possibility of an increased risk of malignancy, specifically lymphomas, but it is not well understood if this increased risk is due to the medication or the disease state.  Instructed patient that  medication should be held for infection and prior to surgery.  Advised patient to avoid live vaccines. Recommend annual influenza, pneumonia, and zoster vaccine as indicated. She will plan to get her 4th COVID19 vaccine prior to starting MTX.  Reviewed instructions with patient to take methotrexate weekly along with folic acid daily.  Discussed the importance of frequent monitoring of kidney and liver function and blood counts, and provided patient with standing lab instructions.  Counseled patient to avoid NSAIDs and alcohol while on methotrexate.  Provided patient with educational materials on methotrexate and answered all questions.   Patient voiced understanding.  Patient consented to methotrexate use.  Will upload into chart.    Educated patient on how to use a vial and syringe and reviewed injection technique with patient. She was also shown Rasuvo/Otrexup - patient has been. Patient was able to demonstrate proper technique for injections using vial and syringe.  Provided patient educational material regarding injection technique and storage of methotrexate.   We reviewed single-use vs multi-use vials of MTX and to discard single-use vials even if med is remaining in the vial  She is able to draw up MTX using vial and syringe if needed - her husband is retired Education officer, community and able to help.  Dose of methotrexate will be 15mg  weekly x [redacted] weeks along with folic acid 2mg  daily. We will recheck labs after 2 weeks before increasing to 20mg  weekly. Prescription pending  all baseline immunosuppressive labs. We will try for Rasuvo/Otrexup.  She declined prednisone taper today d/t past experience of weight gain and undesirable side effects with steroid injection and oral taper.  Chesley Mires, PharmD, MPH Clinical Pharmacist (Rheumatology and Pulmonology)

## 2021-02-04 NOTE — Telephone Encounter (Signed)
Please start Rasuvo/Otrexup BIV.  Dose: 15mg  weekly x 2 weeks thhen 20mg  weekly if labs normal  Dx: RA  Previously tried therapies:  Naive to all therapies  Is these are denied, she is amenable with using MTX vial and syringe. She has been counseled on all subcu MTX formulations.  , PharmD, MPH Clinical Pharmacist (Rheumatology and Pulmonology)

## 2021-02-05 ENCOUNTER — Encounter: Payer: Self-pay | Admitting: Family Medicine

## 2021-02-05 NOTE — Telephone Encounter (Signed)
Spoke with pt state that she will get it from [facility]

## 2021-02-06 ENCOUNTER — Other Ambulatory Visit: Payer: Self-pay | Admitting: *Deleted

## 2021-02-06 DIAGNOSIS — M0600 Rheumatoid arthritis without rheumatoid factor, unspecified site: Secondary | ICD-10-CM

## 2021-02-06 DIAGNOSIS — Z79899 Other long term (current) drug therapy: Secondary | ICD-10-CM

## 2021-02-06 NOTE — Telephone Encounter (Addendum)
Submitted a Prior Authorization request to [facility] for OTREXUP via CoverMyMeds. Will update once we receive a response.   Key: [id number]

## 2021-02-06 NOTE — Telephone Encounter (Signed)
Received a fax regarding Prior Authorization from [facility] for RASUVO. Authorization has been DENIED because patient must try and fail all formulary-preferred alternatives: MTX vial/syringe, Otrexup, and Reditrex.  Routing back to pharmacy pool to start auth for Otrexup  Chesley Mires, PharmD, MPH Clinical Pharmacist (Rheumatology and Pulmonology)

## 2021-02-06 NOTE — Telephone Encounter (Signed)
She has an OV this afternoon, so I will do the refills then

## 2021-02-07 NOTE — Progress Notes (Addendum)
Liver functions are mildly elevated most likely due to NSAID use.  Please advise patient not to take any NSAIDs.  All other labs are within normal limits.  I will discuss results at the follow-up visit.  All the lab results are back to start her on methotrexate.  I do not see a chest x-ray report.  Please notify patient to get chest x-ray as soon as possible so we can start her on methotrexate.  Sarah Ford's addendum: Once she receives chest x-ray, Rx for MTX vial and syringe can be sent to patient-preferred pharmacy. Rasuvo and Otrexup denied. Patient was trained at OV on MTX vial/syringe formulation.

## 2021-02-09 NOTE — Telephone Encounter (Addendum)
Received a fax regarding Prior Authorization from [facility] for OTREXUP. Authorization has been DENIED because patient has not tried and failed generic methotrexate injection  MTX vial and syringe rx can be sent to patient preferred pharmacy.  All immunosuppressive labs wnl. Still awaiting chest x-ray

## 2021-02-10 ENCOUNTER — Other Ambulatory Visit: Payer: Self-pay | Admitting: *Deleted

## 2021-02-10 DIAGNOSIS — R911 Solitary pulmonary nodule: Secondary | ICD-10-CM

## 2021-02-12 LAB — QUANTIFERON-TB GOLD PLUS
Mitogen-NIL: >10 IU/mL
NIL: 0.02 IU/mL
QuantiFERON-TB Gold Plus: NEGATIVE
TB1-NIL: 0 IU/mL
TB2-NIL: 0 IU/mL

## 2021-02-12 LAB — CYCLIC CITRUL PEPTIDE ANTIBODY, IGG: Cyclic Citrullin Peptide Ab: <16 UNITS

## 2021-02-12 LAB — AST: AST: 32 U/L (ref 10–35)

## 2021-02-12 LAB — HEPATITIS C ANTIBODY
Hepatitis C Ab: NONREACTIVE
SIGNAL TO CUT-OFF: 0.01 (ref <1.00)

## 2021-02-12 LAB — IGG, IGA, IGM
IgG (Immunoglobin G), Serum: 1013 mg/dL (ref 600–1640)
IgM, Serum: 59 mg/dL (ref 50–300)
Immunoglobulin A: 309 mg/dL (ref 47–310)

## 2021-02-12 LAB — PROTEIN ELECTROPHORESIS, SERUM, WITH REFLEX
Albumin ELP: 4.5 g/dL (ref 3.8–4.8)
Alpha 1: 0.3 g/dL (ref 0.2–0.3)
Alpha 2: 0.8 g/dL (ref 0.5–0.9)
Beta 2: 0.4 g/dL (ref 0.2–0.5)
Beta Globulin: 0.4 g/dL (ref 0.4–0.6)
Gamma Globulin: 0.9 g/dL (ref 0.8–1.7)
Total Protein: 7.4 g/dL (ref 6.1–8.1)

## 2021-02-12 LAB — ALT: ALT: 35 U/L — ABNORMAL HIGH (ref 6–29)

## 2021-02-12 LAB — SEDIMENTATION RATE: Sed Rate: 2 mm/h (ref 0–30)

## 2021-02-12 LAB — HIV ANTIBODY (ROUTINE TESTING W REFLEX): HIV 1&2 Ab, 4th Generation: NONREACTIVE

## 2021-02-12 LAB — HEPATITIS B SURFACE ANTIGEN: Hepatitis B Surface Ag: NONREACTIVE

## 2021-02-12 LAB — 14-3-3 ETA PROTEIN: 14-3-3 eta Protein: <0.2 ng/mL (ref <0.2)

## 2021-02-12 LAB — HEPATITIS B CORE ANTIBODY, IGM: Hep B C IgM: NONREACTIVE

## 2021-02-15 ENCOUNTER — Encounter: Payer: Self-pay | Admitting: Rheumatology

## 2021-02-17 ENCOUNTER — Encounter: Payer: Self-pay | Admitting: Rheumatology

## 2021-02-17 NOTE — Telephone Encounter (Signed)
I spoke with [facility], and verified order for CT was received. Someone will be calling patient today to schedule. I spoke with patient, and advised her. Patient was giving [facility]'s phone number to call if she does not hear from them today.

## 2021-02-18 ENCOUNTER — Encounter: Payer: Self-pay | Admitting: Family Medicine

## 2021-02-18 ENCOUNTER — Encounter: Payer: Self-pay | Admitting: Rheumatology

## 2021-02-18 ENCOUNTER — Telehealth: Payer: Self-pay | Admitting: Rheumatology

## 2021-02-18 ENCOUNTER — Other Ambulatory Visit: Payer: Self-pay | Admitting: *Deleted

## 2021-02-18 DIAGNOSIS — M0600 Rheumatoid arthritis without rheumatoid factor, unspecified site: Secondary | ICD-10-CM

## 2021-02-18 DIAGNOSIS — R911 Solitary pulmonary nodule: Secondary | ICD-10-CM

## 2021-02-18 NOTE — Telephone Encounter (Signed)
Referral for Pulmonary ordered.

## 2021-02-18 NOTE — Telephone Encounter (Signed)
She states that rasuvo is not covered by her insurance.  Her husband is a retired Education officer, community and she is okay to use while in syringes.  Please discussed with patient subcutaneous methotrexate.  All of her labs are available now which are negative.  CT scan of the chest showed multiple nodulosis and she was referred to pulmonologist for evaluation.

## 2021-02-18 NOTE — Telephone Encounter (Signed)
I reviewed the CT chest without contrast read by Dr. Lowella Dandy and discussed findings with the patient.  There was no suspicious nodule to correspond with the recent radiographic finding.  Although she had several small nodules are scattered throughout the lungs, largest is 5 mm.  I discussed the findings with the patient.  We will make a pulmonary referral.  Pollyann Savoy, MD

## 2021-02-19 ENCOUNTER — Other Ambulatory Visit: Payer: Self-pay

## 2021-02-19 MED ORDER — SUMATRIPTAN SUCCINATE 100 MG PO TABS: ORAL_TABLET | ORAL | 6 refills | Status: DC

## 2021-02-19 MED ORDER — ESCITALOPRAM OXALATE 20 MG PO TABS: 20.0000 mg | ORAL_TABLET | Freq: Two times a day (BID) | ORAL | 0 refills | Status: DC

## 2021-02-19 NOTE — Telephone Encounter (Signed)
I called to check on patient.  She states that her knee is feeling better now.

## 2021-02-19 NOTE — Telephone Encounter (Signed)
After discussion with Dr. Corliss Skains - we will start her on low-dose MTX 0.4 mL SQ vial/syringe (10mg ) weekly along with folic acid 2 mg once daily. Will try to ensure pulm appointment is made as soon as possible  , PharmD, MPH Clinical Pharmacist (Rheumatology and Pulmonology)

## 2021-02-20 MED ORDER — METHOTREXATE SODIUM CHEMO INJECTION 50 MG/2ML: 10.0000 mg | INTRAMUSCULAR | 0 refills | Status: DC

## 2021-02-20 MED ORDER — FOLIC ACID 1 MG PO TABS: 2.0000 mg | ORAL_TABLET | Freq: Every day | ORAL | 3 refills | Status: DC

## 2021-02-20 MED ORDER — "TUBERCULIN SYRINGE 27G X 1/2"" 1 ML MISC": 12.0000 | 3 refills | Status: DC

## 2021-02-20 NOTE — Telephone Encounter (Signed)
Chest x-ray has been ordered, but still remains unscheduled. Will need to reach out to pt to discuss necessity and determine pt's availability.

## 2021-02-23 ENCOUNTER — Other Ambulatory Visit: Payer: Self-pay | Admitting: Obstetrics and Gynecology

## 2021-02-23 ENCOUNTER — Encounter: Payer: Self-pay | Admitting: Rheumatology

## 2021-02-23 DIAGNOSIS — R928 Other abnormal and inconclusive findings on diagnostic imaging of breast: Secondary | ICD-10-CM

## 2021-02-23 NOTE — Telephone Encounter (Signed)
Patient had chest x-ray completed. Rx for MTX vial and syringe sent on Friday 02/20/21. After discussion with Dr. Corliss Skains, she will start on low dose of MTX 10mg  SQ weekly. Patient advised via phone call and MyChart message. Closing encounter.

## 2021-02-23 NOTE — Telephone Encounter (Signed)
Yes, she may take Aleve rarely.

## 2021-03-01 NOTE — Progress Notes (Signed)
Office Visit Note  Patient: Sarah Ford             Date of Birth: 1961/02/26           MRN: [id number]             PCP: Laurey Morale, MD Referring: Laurey Morale, MD Visit Date: 03/11/2021 Occupation: _0 @  Subjective:  Medication management.   History of Present Illness: Ala Kratz is a 60 y.o. female with history of seronegative rheumatoid arthritis.  She states she has been gradually improving and has done much better since she has been on methotrexate.  She states the swelling in her wrist and hands resolved.  She does not have any discomfort in her feet.  She has been tolerating methotrexate without any side effects.  She is currently on methotrexate 0.4 mL subcu weekly along with folic acid.  Activities of Daily Living:  Patient reports morning stiffness for 0 minutes.   Patient Denies nocturnal pain.  Difficulty dressing/grooming: Denies Difficulty climbing stairs: Denies Difficulty getting out of chair: Denies Difficulty using hands for taps, buttons, cutlery, and/or writing: Denies  Review of Systems  Constitutional:  Negative for fatigue.  HENT:  Negative for mouth sores, mouth dryness and nose dryness.   Eyes:  Negative for pain, itching and dryness.  Respiratory:  Negative for shortness of breath and difficulty breathing.   Cardiovascular:  Negative for chest pain and palpitations.  Gastrointestinal:  Negative for blood in stool, constipation and diarrhea.  Endocrine: Negative for increased urination.  Genitourinary:  Negative for difficulty urinating.  Musculoskeletal:  Positive for joint swelling. Negative for joint pain, joint pain, myalgias, morning stiffness, muscle tenderness and myalgias.  Skin:  Negative for color change, rash and redness.  Allergic/Immunologic: Negative for susceptible to infections.  Neurological:  Negative for dizziness, numbness, headaches, memory loss and weakness.  Hematological:  Positive for bruising/bleeding tendency.   Psychiatric/Behavioral:  Negative for confusion.    PMFS History:  Patient Active Problem List   Diagnosis Date Noted   Pulmonary nodules 03/05/2021   Arthralgia 12/10/2020   Peripheral edema 12/10/2020   Chronic right-sided low back pain with right-sided sciatica 01/25/2020   Constipation 10/24/2019   Environmental and seasonal allergies 10/24/2019   Migraine variant 03/09/2010   APHASIA 03/09/2010   INSOMNIA, CHRONIC 05/29/2007   Depression with anxiety 05/29/2007   Osteoporosis 05/29/2007    Past Medical History:  Diagnosis Date   Allergy    Constipation    Depression    IBS (irritable bowel syndrome)    sees Dr. Earlean Shawl    Insomnia    Osteoporosis    gets Reclast per [facility]    Routine gynecological examination    sees Dr. Corinna Capra     Family History  Problem Relation Age of Onset   Cancer Mother    Breast cancer Mother    Thyroid disease Mother    Hypertension Mother    High Cholesterol Mother    Myelodysplastic syndrome Maternal Aunt    Heart failure Father    Kidney failure Father    Diabetes Father    Hypertension Father    High Cholesterol Father    Osteoporosis Sister    Alcohol abuse Sister    Mental illness Sister    Healthy Son    Allergies Daughter    Depression Daughter    Past Surgical History:  Procedure Laterality Date   COLONOSCOPY  08/28/2014   per Dr. Earlean Shawl, clear, repeat in 67  yrs    DILATION AND CURETTAGE OF UTERUS     x2   FOOT SURGERY Right    bone spur   HERNIA REPAIR     umbilical   TEMPOROMANDIBULAR JOINT SURGERY  1993   tummy tuck     dr Towanda Malkin   Social History   Social History Narrative   Not on file   Immunization History  Administered Date(s) Administered   Influenza Inj Mdck Quad With Preservative 06/12/2019   Influenza Split 07/26/2011, 07/05/2012   Influenza Whole 07/05/2007   Influenza,inj,Quad PF,6+ Mos 07/26/2014, 05/30/2015, 06/21/2016, 07/10/2018   Influenza-Unspecified 06/20/2017,  07/04/2020   PFIZER(Purple Top)SARS-COV-2 Vaccination 11/30/2019, 12/22/2019, 07/17/2020, 02/09/2021   PNEUMOCOCCAL CONJUGATE-20 02/19/2021   Tdap 12/14/2012   Zoster Recombinat (Shingrix) 04/10/2018     Objective: Vital Signs: BP 137/89 (BP Location: Left Arm, Patient Position: Sitting, Cuff Size: Normal)   Pulse 72   Ht 5' 2.5" (1.588 m)   Wt 155 lb 9.6 oz (70.6 kg)   BMI 28.01 kg/m    Physical Exam Vitals and nursing note reviewed.  Constitutional:      Appearance: She is well-developed.  HENT:     Head: Normocephalic and atraumatic.  Eyes:     Conjunctiva/sclera: Conjunctivae normal.  Cardiovascular:     Rate and Rhythm: Normal rate and regular rhythm.     Heart sounds: Normal heart sounds.  Pulmonary:     Effort: Pulmonary effort is normal.     Breath sounds: Normal breath sounds.  Abdominal:     General: Bowel sounds are normal.     Palpations: Abdomen is soft.  Musculoskeletal:     Cervical back: Normal range of motion.  Lymphadenopathy:     Cervical: No cervical adenopathy.  Skin:    General: Skin is warm and dry.     Capillary Refill: Capillary refill takes less than 2 seconds.  Neurological:     Mental Status: She is alert and oriented to person, place, and time.  Psychiatric:        Behavior: Behavior normal.     Musculoskeletal Exam: C-spine was in good range of motion.  Shoulder joints, elbow joints, wrist joints, MCPs PIPs and DIPs with good range of motion without synovitis.  She had left wrist extensor tenosynovitis.  Hip joints, knee joints, ankles, MTPs and PIPs with good range of motion with no synovitis.  CDAI Exam: CDAI Score: -- Patient Global: --; Provider Global: -- Swollen: --; Tender: -- Joint Exam 03/11/2021   No joint exam has been documented for this visit   There is currently no information documented on the homunculus. Go to the Rheumatology activity and complete the homunculus joint exam.  Investigation: No additional  findings.  Imaging: No results found.  Recent Labs: Lab Results  Component Value Date   WBC 6.4 12/10/2020   HGB 14.3 12/10/2020   PLT 291.0 12/10/2020   NA 140 12/10/2020   K 4.3 12/10/2020   CL 103 12/10/2020   CO2 27 12/10/2020   GLUCOSE 89 12/10/2020   BUN 13 12/10/2020   CREATININE 1.05 12/10/2020   BILITOT 0.4 12/10/2020   ALKPHOS 87 12/10/2020   AST 32 02/04/2021   ALT 35 (H) 02/04/2021   PROT 7.4 02/04/2021   ALBUMIN 4.6 12/10/2020   CALCIUM 9.5 12/10/2020   GFRAA 71 09/03/2020   QFTBGOLDPLUS NEGATIVE 02/04/2021   May 18th 2022 SPEP normal, immunoglobulins normal, TB Gold negative, hepatitis B-, hepatitis C negative, HIV negative, AST 32, ALT 35, ESR  2, anti-CCP negative, _0 eta negative  12/10/20: ANA negative, RF<14, ESR 7, CRP<1  October 30, 2012 ANA negative, ENA negative, RF negative, anti-CCP negative pan-ANCA negative, ESR 8, ACE normal, HLA-B27 negative, TB Gold negative   Speciality Comments: No specialty comments available.  Procedures:  No procedures performed Allergies: Hydrocodone-acetaminophen and Tramadol   Assessment / Plan:     Visit Diagnoses: Seronegative rheumatoid arthritis ([place]) - H/orecurrent inflammatory arthritis since February 2022.  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  Positive synovitis.  RF-, CCP Ab-, 14 3 3n-.  Patient was started on methotrexate 0.4 mL subcu weekly after the last visit.  She has had 2 doses so far.  She has noticed remarkable improvement in the joint pain and joint swelling.  She had mild extensor tenosynovitis in her left wrist today.  Her LFTs were elevated but she was taking NSAIDs.  She was advised to stop NSAIDs.  The most recent labs from June showed some decrease in her GFR.  I will check labs today.  If her labs are improved and stable then we will be able to increase her methotrexate to 0.6 mL subcu q. weekly.    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  She has been under care of [place] 2014.  Recurrent episcleritis every 6 weeks treated with topical steroids  High risk medication use - Methotrexate0.4 ml sq q week, folic acid 87m po qd - Plan: COMPLETE METABOLIC PANEL WITH GFR.  Her ALT was 35 on Feb 04, 2021 which could be related to the NSAID use.  She was advised to stop NSAIDs.  Her most recent labs from the hospital showed GFR of 58 which could be related to NSAID use or dehydration or methotrexate use.  We will check her CMP with GFR today.  If her labs are stable or improved I may increase her methotrexate to 0.6 mL subcu weekly.  If there is a concern then we can keep her on 0.4 mL subcu weekly.  I advised her to get repeat labs in 2 weeks if she increases to a dose of methotrexate and then every 2 months.  If the methotrexate dose is not increased then she can get labs in 2 months.  Nodule of lower lobe of lung - She had pulmonary work -up.  He supposed to go for a follow-up visit in 1 year.  Pain in both hands - Synovitis noted in the wrist joints and PIP joints.  X-rays were consistent with osteoarthritis.  Pain in both feet - Clinical and radiographic findings are consistent with osteoarthritis.  She had tenderness over bilateral knees and MTPs.  Chronic right-sided low back pain with right-sided sciatica - Improved.  Idiopathic scoliosis of thoracolumbar region  History of osteoporosis - [facility]  Past treatment Fosamax, Actonel and Evista.  Dyslipidemia  Peripheral edema  Depression with anxiety  Environmental and seasonal allergies  Migraine variant  Orders: Orders Placed This Encounter  Procedures   COMPLETE METABOLIC PANEL WITH GFR   No orders of the defined types were placed in this encounter.    Follow-Up Instructions: Return in about 2 months (around 05/11/2021) for Rheumatoid arthritis.   SBo Merino MD  Note - This record has  been created using DEditor, commissioning  Chart creation errors have been sought, but may not always  have been located. Such creation errors do not reflect on  the standard of medical care.

## 2021-03-03 ENCOUNTER — Encounter: Payer: Self-pay | Admitting: Family Medicine

## 2021-03-04 ENCOUNTER — Telehealth: Payer: Self-pay

## 2021-03-04 ENCOUNTER — Institutional Professional Consult (permissible substitution): Payer: BLUE CROSS/BLUE SHIELD | Admitting: Emergency Medicine

## 2021-03-04 NOTE — Telephone Encounter (Signed)
I spoke with the patient.  She thought that the pulmonary nodules were related to rheumatoid arthritis and because she started taking medication she did not need to see the pulmonologist.  I explained to her that I would like for her to be evaluated by a pulmonologist.  She would prefer to see a pulmonologist at [facility] because of her insurance.

## 2021-03-04 NOTE — Telephone Encounter (Signed)
Patient left a voicemail stating she was returning a call to the office.   °

## 2021-03-05 ENCOUNTER — Other Ambulatory Visit: Payer: Self-pay

## 2021-03-05 ENCOUNTER — Encounter: Payer: Self-pay | Admitting: Emergency Medicine

## 2021-03-05 ENCOUNTER — Ambulatory Visit: Payer: BLUE CROSS/BLUE SHIELD | Admitting: Emergency Medicine

## 2021-03-05 ENCOUNTER — Encounter: Payer: Self-pay | Admitting: Rheumatology

## 2021-03-05 DIAGNOSIS — R918 Other nonspecific abnormal finding of lung field: Secondary | ICD-10-CM | POA: Insufficient documentation

## 2021-03-05 NOTE — Addendum Note (Signed)
Addended by: Arvilla Market on: 03/05/2021 03:36 PM   Modules accepted: Orders

## 2021-03-05 NOTE — Progress Notes (Signed)
Subjective:    Patient ID: Sarah Ford, female    DOB: 06-Sep-1961, 60 y.o.   MRN: [id number]  HPI 60 year old woman with a history of tobacco use (3 pack years), irritable bowel syndrome, allergic rhinitis, osteoporosis on Reclast, depression.  She has been newly diagnosed with rheumatoid arthritis and has seen Dr. Corliss Skains with Rheumatology. RF negative 12/10/20.   CT chest was performed 02/18/2021 at [facility].  I have the report available, no images yet.  There were no mediastinal or hilar nodes noted.  There was a 5 mm anterior right middle lobe pleural-based nodule seen, 3 mm right lower lobe nodule, 4 mm medial pleural-based left upper lobe nodule without any suspicious characteristics.   Review of Systems As per HPI  Past Medical History:  Diagnosis Date   Allergy    Constipation    Depression    IBS (irritable bowel syndrome)    sees Dr. Kinnie Scales    Insomnia    Osteoporosis    gets Reclast per [facility] Endocrinology    Routine gynecological examination    sees Dr. Rana Snare      Family History  Problem Relation Age of Onset   Cancer Mother    Breast cancer Mother    Thyroid disease Mother    Hypertension Mother    High Cholesterol Mother    Myelodysplastic syndrome Maternal Aunt    Heart failure Father    Kidney failure Father    Diabetes Father    Hypertension Father    High Cholesterol Father    Osteoporosis Sister    Alcohol abuse Sister    Mental illness Sister    Healthy Son    Allergies Daughter    Depression Daughter     No lung cancer in the family  Social History   Socioeconomic History   Marital status: Married    Spouse name: Not on file   Number of children: Not on file   Years of education: Not on file   Highest education level: Not on file  Occupational History   Not on file  Tobacco Use   Smoking status: Some Days    Pack years: 0.00   Smokeless tobacco: Never   Tobacco comments:    Rare 03/05/21 ARJ  Vaping Use   Vaping Use: Never used   Substance and Sexual Activity   Alcohol use: Yes    Alcohol/week: 0.0 standard drinks    Comment: once every couple of months   Drug use: No   Sexual activity: Not on file  Other Topics Concern   Not on file  Social History Narrative   Not on file   Social Determinants of Health   Financial Resource Strain: Not on file  Food Insecurity: Not on file  Transportation Needs: Not on file  Physical Activity: Not on file  Stress: Not on file  Social Connections: Not on file  Intimate Partner Violence: Not on file    From [place] No military Has worked as a Sales executive; 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  Has dogs, never birds, no chickens.  No hot tub or pool.    Allergies  Allergen Reactions   Hydrocodone-Acetaminophen    Tramadol     itching     Outpatient Medications Prior to Visit  Medication Sig Dispense Refill   atorvastatin (LIPITOR) 20 MG tablet Take 1 tablet (20 mg total) by mouth daily. 90 tablet 2   escitalopram (  LEXAPRO) 20 MG tablet Take 1 tablet (20 mg total) by mouth 2 (two) times daily. 180 tablet 0   folic acid (FOLVITE) 1 MG tablet Take 2 tablets (2 mg total) by mouth daily. 180 tablet 3   LORazepam (ATIVAN) 2 MG tablet Take 1 or 2 at bedtime for sleep 60 tablet 5   methotrexate 50 MG/2ML injection Inject 0.4 mLs (10 mg total) into the skin once a week. 8 mL 0   SUMAtriptan (IMITREX) 100 MG tablet TAKE 1 TABLET BY MOUTH FOR MIGRAINE AS NEEDED AS DIRECTED. 9 tablet 6   TUBERCULIN SYR 1CC/27GX1/2" (B-D TB SYRINGE 1CC/27GX1/2") 27G X 1/2" 1 ML MISC 12 Syringes by Does not apply route once a week. 12 each 3   Zoledronic Acid (RECLAST IV) Inject into the vein. Last IV was 05/2020     cyclobenzaprine (FLEXERIL) 10 MG tablet Take 1 tablet (10 mg total) by mouth 3 (three) times daily as needed for muscle spasms. (Patient not taking: Reported on 03/05/2021) 270 tablet 3   diclofenac (VOLTAREN) 75 MG EC tablet Take 1  tablet (75 mg total) by mouth 2 (two) times daily. (Patient not taking: Reported on 03/05/2021) 180 tablet 3   furosemide (LASIX) 40 MG tablet Take 1 tablet (40 mg total) by mouth daily. (Patient not taking: Reported on 03/05/2021) 30 tablet 3   Magnesium 500 MG CAPS Take by mouth daily. (Patient not taking: Reported on 03/05/2021)     No facility-administered medications prior to visit.         Objective:   Physical Exam Vitals:   03/05/21 1450  BP: 114/72  Pulse: 91  Temp: (!) 97.3 F (36.3 C)  TempSrc: Temporal  SpO2: 97%  Weight: 155 lb 6.4 oz (70.5 kg)  Height: 5\' 2"  (1.575 m)   Gen: Pleasant, well-nourished, in no distress,  normal affect  ENT: No lesions,  mouth clear,  oropharynx clear, no postnasal drip  Neck: No JVD, no stridor  Lungs: No use of accessory muscles, no crackles or wheezing on normal respiration, no wheeze on forced expiration  Cardiovascular: RRR, heart sounds normal, no murmur or gallops, no peripheral edema  Musculoskeletal: No deformities, no cyanosis or clubbing.  Some mild swelling of her proximal left toe joints.  No other joint nodules noted  Neuro: alert, awake, non focal  Skin: Warm, no lesions or rash     Assessment & Plan:   Pulmonary nodules Small scattered pulmonary nodules, all less than 5 mm noted on CT chest done at [facility].  She is a low risk patient with a minimal tobacco history, no family history, no personal history of cancer.  We could consider deferring any further imaging but given her history of rheumatoid arthritis and the plan for her to be on immunosuppression I think it is reasonable for [facility] to do serial imaging.  Her next CT chest will be in 1 year, June 2023.   July 2023, MD, PhD 03/05/2021, 3:25 PM [facility] [phone] or if no answer before 7:00PM call [phone] For any issues after 7:00PM please call eLink [phone]

## 2021-03-05 NOTE — Assessment & Plan Note (Signed)
Small scattered pulmonary nodules, all less than 5 mm noted on CT chest done at [facility].  She is a low risk patient with a minimal tobacco history, no family history, no personal history of cancer.  We could consider deferring any further imaging but given her history of rheumatoid arthritis and the plan for her to be on immunosuppression I think it is reasonable for [place] to do serial imaging.  Her next CT chest will be in 1 year, June 2023.

## 2021-03-05 NOTE — Telephone Encounter (Signed)
I called patient, the closest [facility] pulmonologist is in [place] - first available appt 05/2021, patient does not want to travel to [facility], patient will call pulmonologist in [place] to r/s appt.

## 2021-03-05 NOTE — Patient Instructions (Signed)
We will plan to repeat your CT scan of the chest in June 2023. Please contact our office if you develop any new respiratory symptoms, shortness of breath. Follow Dr. Delton Coombes in June 2023 so that we can review your CT scan together.

## 2021-03-08 ENCOUNTER — Encounter: Payer: Self-pay | Admitting: Rheumatology

## 2021-03-09 ENCOUNTER — Telehealth: Payer: Self-pay | Admitting: Rheumatology

## 2021-03-09 ENCOUNTER — Encounter: Payer: Self-pay | Admitting: Rheumatology

## 2021-03-09 NOTE — Telephone Encounter (Signed)
I spoke with the patient at 4 AM today.  Patient was advised to go to the emergency room.

## 2021-03-09 NOTE — Telephone Encounter (Signed)
I received a phone call from patient at 4 AM this morning.  Patient stated that she has been experiencing headache for the last 48 hours.  She describes her headache to be severe.  She was advised to go to the emergency room.  She was also advised to follow-up with her PCP after her ER visit. Pollyann Savoy, MD

## 2021-03-11 ENCOUNTER — Other Ambulatory Visit: Payer: Self-pay

## 2021-03-11 ENCOUNTER — Encounter: Payer: Self-pay | Admitting: Rheumatology

## 2021-03-11 ENCOUNTER — Ambulatory Visit: Payer: BLUE CROSS/BLUE SHIELD | Admitting: Rheumatology

## 2021-03-11 VITALS — BP 137/89 | HR 72 | Ht 62.5 in | Wt 155.6 lb

## 2021-03-11 DIAGNOSIS — M0600 Rheumatoid arthritis without rheumatoid factor, unspecified site: Secondary | ICD-10-CM

## 2021-03-11 DIAGNOSIS — J3089 Other allergic rhinitis: Secondary | ICD-10-CM

## 2021-03-11 DIAGNOSIS — R609 Edema, unspecified: Secondary | ICD-10-CM

## 2021-03-11 DIAGNOSIS — G8929 Other chronic pain: Secondary | ICD-10-CM

## 2021-03-11 DIAGNOSIS — M79641 Pain in right hand: Secondary | ICD-10-CM

## 2021-03-11 DIAGNOSIS — E785 Hyperlipidemia, unspecified: Secondary | ICD-10-CM

## 2021-03-11 DIAGNOSIS — R911 Solitary pulmonary nodule: Secondary | ICD-10-CM

## 2021-03-11 DIAGNOSIS — H15103 Unspecified episcleritis, bilateral: Secondary | ICD-10-CM | POA: Diagnosis not present

## 2021-03-11 DIAGNOSIS — M79642 Pain in left hand: Secondary | ICD-10-CM

## 2021-03-11 DIAGNOSIS — Z79899 Other long term (current) drug therapy: Secondary | ICD-10-CM | POA: Diagnosis not present

## 2021-03-11 DIAGNOSIS — M5441 Lumbago with sciatica, right side: Secondary | ICD-10-CM

## 2021-03-11 DIAGNOSIS — M4125 Other idiopathic scoliosis, thoracolumbar region: Secondary | ICD-10-CM

## 2021-03-11 DIAGNOSIS — M79671 Pain in right foot: Secondary | ICD-10-CM

## 2021-03-11 DIAGNOSIS — Z8739 Personal history of other diseases of the musculoskeletal system and connective tissue: Secondary | ICD-10-CM

## 2021-03-11 DIAGNOSIS — M79672 Pain in left foot: Secondary | ICD-10-CM

## 2021-03-11 DIAGNOSIS — F418 Other specified anxiety disorders: Secondary | ICD-10-CM

## 2021-03-11 DIAGNOSIS — R600 Localized edema: Secondary | ICD-10-CM

## 2021-03-11 DIAGNOSIS — G43809 Other migraine, not intractable, without status migrainosus: Secondary | ICD-10-CM

## 2021-03-11 NOTE — Patient Instructions (Signed)
Standing Labs We placed an order today for your standing lab work.   Please have your standing labs drawn in 2 weeks and 2 months  If possible, please have your labs drawn 2 weeks prior to your appointment so that the provider can discuss your results at your appointment.  Please note that you may see your imaging and lab results in MyChart before we have reviewed them. We may be awaiting multiple results to interpret others before contacting you. Please allow our office up to 72 hours to thoroughly review all of the results before contacting the office for clarification of your results.  We have open lab daily: Monday through Thursday from 1:30-4:30 PM and Friday from 1:30-4:00 PM at the office of Dr. Pollyann Savoy, [facility].   Please be advised, all patients with office appointments requiring lab work will take precedent over walk-in lab work.  If possible, please come for your lab work on Monday and Friday afternoons, as you may experience shorter wait times. The office is located at [address], [place], [place] [address] No appointment is necessary.   Labs are drawn by [facility]. Please bring your co-pay at the time of your lab draw.  You may receive a bill from [facility] for your lab work.  If you wish to have your labs drawn at another location, please call the office 24 hours in advance to send orders.  If you have any questions regarding directions or hours of operation,  please call [phone].   As a reminder, please drink plenty of water prior to coming for your lab work. Thanks!   If you test POSITIVE for COVID19 and have MILD to MODERATE symptoms: First, call your PCP if you would like to receive COVID19 treatment AND Hold your medications during the infection and for at least 1 week after your symptoms have resolved: Injectable medication (Benlysta, Cimzia, Cosentyx, Enbrel, Humira, Orencia, Remicade, Simponi, Stelara, Taltz,  Tremfya) Methotrexate Leflunomide (Arava) Mycophenolate (Cellcept) Harriette Ohara, Olumiant, or Rinvoq If you take Actemra or Kevzara, you DO NOT need to hold these for COVID19 infection.  If you test POSITIVE for COVID19 and have NO symptoms: First, call your PCP if you would like to receive COVID19 treatment AND Hold your medications for at least 10 days after the day that you tested positive Injectable medication (Benlysta, Cimzia, Cosentyx, Enbrel, Humira, Orencia, Remicade, Simponi, Stelara, Taltz, Tremfya) Methotrexate Leflunomide (Arava) Mycophenolate (Cellcept) Harriette Ohara, Olumiant, or Rinvoq If you take Actemra or Kevzara, you DO NOT need to hold these for COVID19 infection.   If you have signs or symptoms of an infection or start antibiotics: First, call your PCP for workup of your infection. Hold your medication through the infection, until you complete your antibiotics, and until symptoms resolve if you take the following: Injectable medication (Actemra, Benlysta, Cimzia, Cosentyx, Enbrel, Humira, Kevzara, Orencia, Remicade, Simponi, Stelara, Taltz, Tremfya) Methotrexate Leflunomide (Arava) Mycophenolate (Cellcept) Harriette Ohara, Olumiant, or Rinvoq   Heart Disease Prevention   Your inflammatory disease increases your risk of heart disease which includes heart attack, stroke, atrial fibrillation (irregular heartbeats), high blood pressure, heart failure and atherosclerosis (plaque in the arteries).  It is important to reduce your risk by:   Keep blood pressure, cholesterol, and blood sugar at healthy levels   Smoking Cessation   Maintain a healthy weight  BMI 20-25   Eat a healthy diet  Plenty of fresh fruit, vegetables, and whole grains  Limit saturated fats, foods high in sodium, and added  sugars  DASH and Mediterranean diet   Increase physical activity  Recommend moderate physically activity for 150 minutes per week/ 30 minutes a day for five days a week These can be broken  up into three separate ten-minute sessions during the day.   Reduce Stress  Meditation, slow breathing exercises, yoga, coloring books  Dental visits twice a year    Vaccines You are taking a medication(s) that can suppress your immune system.  The following immunizations are recommended: Flu annually Covid-19  Td/Tdap (tetanus, diphtheria, pertussis) every 10 years Pneumonia (Prevnar 15 then Pneumovax 23 at least 1 year apart.  Alternatively, can take Prevnar 20 without needing additional dose) Shingrix (after age 37): 2 doses from 4 weeks to 6 months apart  Please check with your PCP to make sure you are up to date.

## 2021-03-12 ENCOUNTER — Other Ambulatory Visit: Payer: Self-pay | Admitting: Rheumatology

## 2021-03-12 DIAGNOSIS — M0600 Rheumatoid arthritis without rheumatoid factor, unspecified site: Secondary | ICD-10-CM

## 2021-03-12 LAB — COMPLETE METABOLIC PANEL WITH GFR
AG Ratio: 1.7 (calc) (ref 1.0–2.5)
ALT: 24 U/L (ref 6–29)
AST: 21 U/L (ref 10–35)
Albumin: 4.5 g/dL (ref 3.6–5.1)
Alkaline phosphatase (APISO): 83 U/L (ref 37–153)
BUN: 14 mg/dL (ref 7–25)
CO2: 25 mmol/L (ref 20–32)
Calcium: 9.3 mg/dL (ref 8.6–10.4)
Chloride: 103 mmol/L (ref 98–110)
Creat: 0.93 mg/dL (ref 0.50–1.05)
GFR, Est African American: 79 mL/min/{1.73_m2} (ref >=60)
GFR, Est Non African American: 68 mL/min/{1.73_m2} (ref >=60)
Globulin: 2.7 g/dL (calc) (ref 1.9–3.7)
Glucose, Bld: 84 mg/dL (ref 65–99)
Potassium: 4.5 mmol/L (ref 3.5–5.3)
Sodium: 138 mmol/L (ref 135–146)
Total Bilirubin: 0.4 mg/dL (ref 0.2–1.2)
Total Protein: 7.2 g/dL (ref 6.1–8.1)

## 2021-03-13 NOTE — Telephone Encounter (Signed)
Next Visit: 05/13/2021  Last Visit: 03/11/2021  Last Fill: 02/20/2021 (30 day supply)  DX: Seronegative rheumatoid arthritis  Current Dose per office note 03/11/2021: Methotrexate0.4 ml sq q week  Labs: 03/09/2021 CBC WNL, CMP 03/11/2021 WNL  Okay to refill MTX?

## 2021-03-16 ENCOUNTER — Ambulatory Visit
Admission: RE | Admit: 2021-03-16 | Discharge: 2021-03-16 | Disposition: A | Discharge status: Home / Self Care | Payer: BLUE CROSS/BLUE SHIELD | Source: Ambulatory Visit | Attending: Obstetrics and Gynecology | Admitting: Obstetrics and Gynecology

## 2021-03-16 ENCOUNTER — Other Ambulatory Visit: Payer: Self-pay

## 2021-03-16 ENCOUNTER — Ambulatory Visit: Payer: BLUE CROSS/BLUE SHIELD

## 2021-03-16 DIAGNOSIS — R928 Other abnormal and inconclusive findings on diagnostic imaging of breast: Secondary | ICD-10-CM

## 2021-03-27 ENCOUNTER — Other Ambulatory Visit: Payer: Self-pay | Admitting: Obstetrics and Gynecology

## 2021-03-27 DIAGNOSIS — Z803 Family history of malignant neoplasm of breast: Secondary | ICD-10-CM

## 2021-04-10 ENCOUNTER — Other Ambulatory Visit: Payer: Self-pay | Admitting: Family Medicine

## 2021-04-15 ENCOUNTER — Encounter: Payer: Self-pay | Admitting: Rheumatology

## 2021-04-21 ENCOUNTER — Encounter: Payer: Self-pay | Admitting: Rheumatology

## 2021-04-24 ENCOUNTER — Other Ambulatory Visit: Payer: Self-pay | Admitting: *Deleted

## 2021-04-24 DIAGNOSIS — M0600 Rheumatoid arthritis without rheumatoid factor, unspecified site: Secondary | ICD-10-CM

## 2021-04-24 DIAGNOSIS — Z79899 Other long term (current) drug therapy: Secondary | ICD-10-CM

## 2021-04-25 LAB — CBC WITH DIFFERENTIAL/PLATELET
Absolute Monocytes: 547 cells/uL (ref 200–950)
Basophils Absolute: 38 cells/uL (ref 0–200)
Basophils Relative: 0.5 %
Eosinophils Absolute: 160 cells/uL (ref 15–500)
Eosinophils Relative: 2.1 %
HCT: 42 % (ref 35.0–45.0)
Hemoglobin: 14.1 g/dL (ref 11.7–15.5)
Lymphs Abs: 1733 cells/uL (ref 850–3900)
MCH: 30.5 pg (ref 27.0–33.0)
MCHC: 33.6 g/dL (ref 32.0–36.0)
MCV: 90.7 fL (ref 80.0–100.0)
MPV: 10.5 fL (ref 7.5–12.5)
Monocytes Relative: 7.2 %
Neutro Abs: 5122 cells/uL (ref 1500–7800)
Neutrophils Relative %: 67.4 %
Platelets: 279 10*3/uL (ref 140–400)
RBC: 4.63 10*6/uL (ref 3.80–5.10)
RDW: 14.1 % (ref 11.0–15.0)
Total Lymphocyte: 22.8 %
WBC: 7.6 10*3/uL (ref 3.8–10.8)

## 2021-04-25 LAB — COMPLETE METABOLIC PANEL WITH GFR
AG Ratio: 1.6 (calc) (ref 1.0–2.5)
ALT: 35 U/L — ABNORMAL HIGH (ref 6–29)
AST: 27 U/L (ref 10–35)
Albumin: 4.4 g/dL (ref 3.6–5.1)
Alkaline phosphatase (APISO): 101 U/L (ref 37–153)
BUN/Creatinine Ratio: 11 (calc) (ref 6–22)
BUN: 13 mg/dL (ref 7–25)
CO2: 29 mmol/L (ref 20–32)
Calcium: 9.4 mg/dL (ref 8.6–10.4)
Chloride: 103 mmol/L (ref 98–110)
Creat: 1.2 mg/dL — ABNORMAL HIGH (ref 0.50–1.03)
Globulin: 2.7 g/dL (calc) (ref 1.9–3.7)
Glucose, Bld: 119 mg/dL (ref 65–139)
Potassium: 3.9 mmol/L (ref 3.5–5.3)
Sodium: 139 mmol/L (ref 135–146)
Total Bilirubin: 0.5 mg/dL (ref 0.2–1.2)
Total Protein: 7.1 g/dL (ref 6.1–8.1)
eGFR: 53 mL/min/{1.73_m2} — ABNORMAL LOW (ref >=60)

## 2021-04-25 NOTE — Progress Notes (Signed)
CBC is normal, creatinine and liver functions were mildly elevated.  Which may be due to the use of Lasix and statins respectively.  I will discuss possible change in treatment at the follow-up visit .
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
## 2021-05-13 ENCOUNTER — Other Ambulatory Visit: Payer: Self-pay

## 2021-05-13 ENCOUNTER — Encounter: Payer: Self-pay | Admitting: Rheumatology

## 2021-05-13 ENCOUNTER — Ambulatory Visit: Payer: BLUE CROSS/BLUE SHIELD | Admitting: Rheumatology

## 2021-05-13 VITALS — BP 121/84 | HR 75 | Resp 14 | Ht 62.5 in | Wt 152.6 lb

## 2021-05-13 DIAGNOSIS — M79672 Pain in left foot: Secondary | ICD-10-CM

## 2021-05-13 DIAGNOSIS — R911 Solitary pulmonary nodule: Secondary | ICD-10-CM

## 2021-05-13 DIAGNOSIS — R609 Edema, unspecified: Secondary | ICD-10-CM

## 2021-05-13 DIAGNOSIS — G8929 Other chronic pain: Secondary | ICD-10-CM

## 2021-05-13 DIAGNOSIS — R600 Localized edema: Secondary | ICD-10-CM

## 2021-05-13 DIAGNOSIS — H15103 Unspecified episcleritis, bilateral: Secondary | ICD-10-CM

## 2021-05-13 DIAGNOSIS — M5441 Lumbago with sciatica, right side: Secondary | ICD-10-CM

## 2021-05-13 DIAGNOSIS — M0600 Rheumatoid arthritis without rheumatoid factor, unspecified site: Secondary | ICD-10-CM

## 2021-05-13 DIAGNOSIS — Z79899 Other long term (current) drug therapy: Secondary | ICD-10-CM

## 2021-05-13 DIAGNOSIS — M79671 Pain in right foot: Secondary | ICD-10-CM

## 2021-05-13 DIAGNOSIS — F418 Other specified anxiety disorders: Secondary | ICD-10-CM

## 2021-05-13 DIAGNOSIS — J3089 Other allergic rhinitis: Secondary | ICD-10-CM

## 2021-05-13 DIAGNOSIS — G43809 Other migraine, not intractable, without status migrainosus: Secondary | ICD-10-CM

## 2021-05-13 DIAGNOSIS — Z8739 Personal history of other diseases of the musculoskeletal system and connective tissue: Secondary | ICD-10-CM

## 2021-05-13 DIAGNOSIS — E785 Hyperlipidemia, unspecified: Secondary | ICD-10-CM

## 2021-05-13 DIAGNOSIS — M4125 Other idiopathic scoliosis, thoracolumbar region: Secondary | ICD-10-CM

## 2021-05-13 DIAGNOSIS — M79642 Pain in left hand: Secondary | ICD-10-CM

## 2021-05-13 DIAGNOSIS — M79641 Pain in right hand: Secondary | ICD-10-CM | POA: Diagnosis not present

## 2021-05-13 MED ORDER — LEFLUNOMIDE 10 MG PO TABS: 10.0000 mg | ORAL_TABLET | Freq: Every day | ORAL | 2 refills | Status: DC

## 2021-05-13 NOTE — Patient Instructions (Signed)
Leflunomide tablets What is this medication? LEFLUNOMIDE (le FLOO na mide) is for rheumatoid arthritis. This medicine may be used for other purposes; ask your health care provider orpharmacist if you have questions. COMMON BRAND NAME(S): Arava What should I tell my care team before I take this medication? They need to know if you have any of these conditions: diabetes have a fever or infection high blood pressure immune system problems kidney disease liver disease low blood cell counts, like low white cell, platelet, or red cell counts lung or breathing disease, like asthma recently received or scheduled to receive a vaccine receiving treatment for cancer skin conditions or sensitivity tingling of the fingers or toes, or other nerve disorder tuberculosis an unusual or allergic reaction to leflunomide, teriflunomide, other medicines, food, dyes, or preservatives pregnant or trying to get pregnant breast-feeding How should I use this medication? Take this medicine by mouth with a full glass of water. Follow the directions on the prescription label. Take your medicine at regular intervals. Do not take your medicine more often than directed. Do not stop taking except on yourdoctor's advice. Talk to your pediatrician regarding the use of this medicine in children.Special care may be needed. Overdosage: If you think you have taken too much of this medicine contact apoison control center or emergency room at once. NOTE: This medicine is only for you. Do not share this medicine with others. What if I miss a dose? If you miss a dose, take it as soon as you can. If it is almost time for yournext dose, take only that dose. Do not take double or extra doses. What may interact with this medication? Do not take this medicine with any of the following medications: teriflunomide This medicine may also interact with the following medications: alosetron birth control  pills caffeine cefaclor certain medicines for diabetes like nateglinide, repaglinide, rosiglitazone, pioglitazone certain medicines for high cholesterol like atorvastatin, pravastatin, rosuvastatin, simvastatin charcoal cholestyramine ciprofloxacin duloxetine furosemide ketoprofen live virus vaccines medicines that increase your risk for infection methotrexate mitoxantrone paclitaxel penicillin theophylline tizanidine warfarin This list may not describe all possible interactions. Give your health care provider a list of all the medicines, herbs, non-prescription drugs, or dietary supplements you use. Also tell them if you smoke, drink alcohol, or use illegaldrugs. Some items may interact with your medicine. What should I watch for while using this medication? Visit your health care provider for regular checks on your progress. Tell your doctor or health care provider if your symptoms do not start to get better or if they get worse. You may need blood work done while you are taking thismedicine. This medicine may cause serious skin reactions. They can happen weeks to months after starting the medicine. Contact your health care provider right away if you notice fevers or flu-like symptoms with a rash. The rash may be red or purple and then turn into blisters or peeling of the skin. Or, you might notice a red rash with swelling of the face, lips or lymph nodes in your neck or underyour arms. This medicine may stay in your body for up to 2 years after your last dose. Tell your doctor about any unusual side effects or symptoms. A medicine can begiven to help lower your blood levels of this medicine more quickly. Women must use effective birth control with this medicine. There is a potential for serious side effects to an unborn child. Do not become pregnant while taking this medicine. Inform your doctor if you wish  to become pregnant. This medicine remains in your blood after you stop taking it.  You must continue using effective birth control until the blood levels have been checked and they are low enough. A medicine can be given to help lower your blood levels of this medicine more quickly. Immediately talk to your doctor if you think you may be pregnant. You may need a pregnancy test. Talk to your health care provider orpharmacist for more information. You should not receive certain vaccines during your treatment and for a certain time after your treatment with this medication ends. Talk to your health careprovider for more information. What side effects may I notice from receiving this medication? Side effects that you should report to your doctor or health care professionalas soon as possible: allergic reactions like skin rash, itching or hives, swelling of the face, lips, or tongue breathing problems cough increased blood pressure low blood counts - this medicine may decrease the number of white blood cells and platelets. You may be at increased risk for infections and bleeding. pain, tingling, numbness in the hands or feet rash, fever, and swollen lymph nodes redness, blistering, peeing or loosening of the skin, including inside the mouth signs of decreased platelets or bleeding - bruising, pinpoint red spots on the skin, black, tarry stools, blood in urine signs of infection - fever or chills, cough, sore throat, pain or trouble passing urine signs and symptoms of liver injury like dark yellow or brown urine; general ill feeling or flu-like symptoms; light-colored stools; loss of appetite; nausea; right upper belly pain; unusually weak or tired; yellowing of the eyes or skin trouble passing urine or change in the amount of urine vomiting Side effects that usually do not require medical attention (report to yourdoctor or health care professional if they continue or are bothersome): diarrhea hair thinning or loss headache nausea tiredness This list may not describe all possible  side effects. Call your doctor for medical advice about side effects. You may report side effects to FDA at1-800-FDA-1088. Where should I keep my medication? Keep out of the reach of children. Store at room temperature between 15 and 30 degrees C (59 and 86 degrees F). Protect from moisture and light. Throw away any unused medicine after theexpiration date. NOTE: This sheet is a summary. It may not cover all possible information. If you have questions about this medicine, talk to your doctor, pharmacist, orhealth care provider.  2022 Elsevier/Gold Standard (2018-12-08 15:06:48)  Standing Labs We placed an order today for your standing lab work.   Please have your standing labs drawn in 2 weeks. 2 months and then every 3 months  If possible, please have your labs drawn 2 weeks prior to your appointment so that the provider can discuss your results at your appointment.  Please note that you may see your imaging and lab results in MyChart before we have reviewed them. We may be awaiting multiple results to interpret others before contacting you. Please allow our office up to 72 hours to thoroughly review all of the results before contacting the office for clarification of your results.  We have open lab daily: Monday through Thursday from 1:30-4:30 PM and Friday from 1:30-4:00 PM at the office of Dr. Pollyann Savoy, [facility].   Please be advised, all patients with office appointments requiring lab work will take precedent over walk-in lab work.  If possible, please come for your lab work on Monday and Friday afternoons, as you may experience shorter wait  times. The office is located at [address], [place], [place] [address] No appointment is necessary.   Labs are drawn by [facility]. Please bring your co-pay at the time of your lab draw.  You may receive a bill from [facility] for your lab work.  If you wish to have your labs drawn at another location, please call the  office 24 hours in advance to send orders.  If you have any questions regarding directions or hours of operation,  please call [phone].   As a reminder, please drink plenty of water prior to coming for your lab work. Thanks!   If you test POSITIVE for COVID19 and have MILD to MODERATE symptoms: First, call your PCP if you would like to receive COVID19 treatment AND Hold your medications during the infection and for at least 1 week after your symptoms have resolved: Injectable medication (Benlysta, Cimzia, Cosentyx, Enbrel, Humira, Orencia, Remicade, Simponi, Stelara, Taltz, Tremfya) Methotrexate Leflunomide (Arava) Azathioprine Mycophenolate (Cellcept) Osborne [place], or Rinvoq Otezla If you take Actemra or Kevzara, you DO NOT need to hold these for COVID19 infection.  If you test POSITIVE for COVID19 and have NO symptoms: First, call your PCP if you would like to receive COVID19 treatment AND Hold your medications for at least 10 days after the day that you tested positive Injectable medication (Benlysta, Cimzia, Cosentyx, Enbrel, Humira, Orencia, Remicade, Simponi, Stelara, Taltz, Tremfya) Methotrexate Leflunomide (Arava) Azathioprine Mycophenolate (Cellcept) Osborne [place], or Rinvoq Otezla If you take Actemra or Kevzara, you DO NOT need to hold these for COVID19 infection.  If you have signs or symptoms of an infection or start antibiotics: First, call your PCP for workup of your infection. Hold your medication through the infection, until you complete your antibiotics, and until symptoms resolve if you take the following: Injectable medication (Actemra, Benlysta, Cimzia, Cosentyx, Enbrel, Humira, Kevzara, Orencia, Remicade, Simponi, Stelara, Taltz, Tremfya) Methotrexate Leflunomide (Arava) Mycophenolate (Cellcept) Harriette [place], Olumiant, or Rinvoq  Vaccines You are taking a medication(s) that can suppress your immune system.  The following immunizations are  recommended: Flu annually Covid-19  Td/Tdap (tetanus, diphtheria, pertussis) every 10 years Pneumonia (Prevnar 15 then Pneumovax 23 at least 1 year apart.  Alternatively, can take Prevnar 20 without needing additional dose) Shingrix (after age 76): 2 doses from 4 weeks to 6 months apart  Please check with your PCP to make sure you are up to date.  Heart Disease Prevention   Your inflammatory disease increases your risk of heart disease which includes heart attack, stroke, atrial fibrillation (irregular heartbeats), high blood pressure, heart failure and atherosclerosis (plaque in the arteries).  It is important to reduce your risk by:   Keep blood pressure, cholesterol, and blood sugar at healthy levels   Smoking Cessation   Maintain a healthy weight  BMI 20-25   Eat a healthy diet  Plenty of fresh fruit, vegetables, and whole grains  Limit saturated fats, foods high in sodium, and added sugars  DASH and Mediterranean diet   Increase physical activity  Recommend moderate physically activity for 150 minutes per week/ 30 minutes a day for five days a week These can be broken up into three separate ten-minute sessions during the day.   Reduce Stress  Meditation, slow breathing exercises, yoga, coloring books  Dental visits twice a year

## 2021-05-21 ENCOUNTER — Telehealth: Payer: Self-pay

## 2021-05-21 NOTE — Telephone Encounter (Signed)
Per Sherron Ales, PA-C, can Dr. Cathey Endow prescribe prednisone or eye drops? Spoke to [place] at [facility], she will give message to Dr. Cathey Endow, who will be back in the office on 05/22/2021.

## 2021-05-21 NOTE — Telephone Encounter (Signed)
Carla from [facility] called stating Dr. Cathey Endow wants to prescribe NSAIDS, but the patient states her kidney function is not good.  Please call back at #[phone]

## 2021-05-22 ENCOUNTER — Telehealth: Payer: Self-pay

## 2021-05-22 NOTE — Telephone Encounter (Signed)
Carla from Dr. Ovidio Kin office called stating patient was prescribed Pred Forte drops (4 times/day) and wants to know if they should also prescribe oral Prednisone.  Please call back at #[phone]

## 2021-05-22 NOTE — Telephone Encounter (Signed)
LMOM w/Carla, per Sherron Ales, PA-C, Dr. Cathey Endow should treat patient as Dr. Cathey Endow finds necessary. We have not seen the patient in the office to evaluate her eye symptoms.

## 2021-05-26 ENCOUNTER — Telehealth: Payer: Self-pay | Admitting: Physician Assistant

## 2021-05-26 NOTE — Telephone Encounter (Signed)
I returned Dr. 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  This patient has history of RA and recurrent episcleritis.  According to Dr. 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  Recommended against the use of NSAIDs due to elevated creatinine (1.20) and low GFR (53). He plans to use steroid drops and a prednisone taper.   She was started on a arava after her last office visit on 05/13/21.  We will call to schedule a visit to discuss other systemic treatment options.    Sherron Ales, PA-C

## 2021-05-29 ENCOUNTER — Other Ambulatory Visit: Payer: Self-pay

## 2021-05-29 DIAGNOSIS — M0600 Rheumatoid arthritis without rheumatoid factor, unspecified site: Secondary | ICD-10-CM

## 2021-05-29 DIAGNOSIS — Z79899 Other long term (current) drug therapy: Secondary | ICD-10-CM

## 2021-05-29 LAB — CBC WITH DIFFERENTIAL/PLATELET
Absolute Monocytes: 622 cells/uL (ref 200–950)
Basophils Absolute: 43 cells/uL (ref 0–200)
Basophils Relative: 0.7 %
Eosinophils Absolute: 201 cells/uL (ref 15–500)
Eosinophils Relative: 3.3 %
HCT: 41.2 % (ref 35.0–45.0)
Hemoglobin: 14.2 g/dL (ref 11.7–15.5)
Lymphs Abs: 1751 cells/uL (ref 850–3900)
MCH: 31.1 pg (ref 27.0–33.0)
MCHC: 34.5 g/dL (ref 32.0–36.0)
MCV: 90.2 fL (ref 80.0–100.0)
MPV: 11.1 fL (ref 7.5–12.5)
Monocytes Relative: 10.2 %
Neutro Abs: 3483 cells/uL (ref 1500–7800)
Neutrophils Relative %: 57.1 %
Platelets: 298 10*3/uL (ref 140–400)
RBC: 4.57 10*6/uL (ref 3.80–5.10)
RDW: 14 % (ref 11.0–15.0)
Total Lymphocyte: 28.7 %
WBC: 6.1 10*3/uL (ref 3.8–10.8)

## 2021-05-29 LAB — COMPLETE METABOLIC PANEL WITH GFR
AG Ratio: 1.8 (calc) (ref 1.0–2.5)
ALT: 33 U/L — ABNORMAL HIGH (ref 6–29)
AST: 28 U/L (ref 10–35)
Albumin: 4.5 g/dL (ref 3.6–5.1)
Alkaline phosphatase (APISO): 109 U/L (ref 37–153)
BUN: 12 mg/dL (ref 7–25)
CO2: 27 mmol/L (ref 20–32)
Calcium: 9.2 mg/dL (ref 8.6–10.4)
Chloride: 104 mmol/L (ref 98–110)
Creat: 0.94 mg/dL (ref 0.50–1.03)
Globulin: 2.5 g/dL (calc) (ref 1.9–3.7)
Glucose, Bld: 79 mg/dL (ref 65–99)
Potassium: 4.6 mmol/L (ref 3.5–5.3)
Sodium: 139 mmol/L (ref 135–146)
Total Bilirubin: 0.4 mg/dL (ref 0.2–1.2)
Total Protein: 7 g/dL (ref 6.1–8.1)
eGFR: 71 mL/min/{1.73_m2} (ref >=60)

## 2021-05-30 NOTE — Progress Notes (Signed)
CMP shows mild elevation of liver function.  Kidney function is normal now.  CBC is normal.

## 2021-05-31 ENCOUNTER — Other Ambulatory Visit: Payer: Self-pay | Admitting: Family Medicine

## 2021-06-01 MED ORDER — ESCITALOPRAM OXALATE 20 MG PO TABS: 20.0000 mg | ORAL_TABLET | Freq: Two times a day (BID) | ORAL | 0 refills | Status: DC

## 2021-06-01 NOTE — Progress Notes (Signed)
Please advised to increase the dose of leflunomide to 20 mg p.o. daily.  Repeat labs (CBC with differential and CMP with GFR) in 2 weeks after increasing the dose of leflunomide.

## 2021-06-03 ENCOUNTER — Other Ambulatory Visit: Payer: Self-pay | Admitting: Family Medicine

## 2021-06-07 ENCOUNTER — Encounter: Payer: Self-pay | Admitting: Rheumatology

## 2021-06-08 ENCOUNTER — Telehealth: Payer: Self-pay

## 2021-06-08 NOTE — Telephone Encounter (Signed)
Next Visit: 07/07/2021  Last Visit: 05/13/2021  Last Fill: 05/13/2021   DX: Seronegative rheumatoid arthritis   Current Dose per lab note 05/29/2021: Please advise to increase the dose of leflunomide to 20 mg p.o. daily  Labs: 05/29/2021 CMP shows mild elevation of liver function.  Kidney function is normal now.  CBC is normal.  Okay to refill Arava?

## 2021-06-08 NOTE — Telephone Encounter (Signed)
Dr. Corliss Skains advised the patient to increase arava to 20 mg daily after her last lab work on 05/29/21.  She will require lab work 2 weeks after increasing the dose of arava.  I have not evaluated this patient since she established care with [place], so I am unsure of what cyst she is mentioning. I reviewed Dr. Fatima Sanger last note and do not see any documentation about the cyst.   I would be more than happy to see the patient in the office sooner than her follow up visit on 07/07/21 if she is concerned.

## 2021-06-08 NOTE — Telephone Encounter (Signed)
Patient has been scheduled for a follow up visit 06/09/2021 at 2:20 pm.

## 2021-06-08 NOTE — Telephone Encounter (Signed)
Error

## 2021-06-09 ENCOUNTER — Other Ambulatory Visit: Payer: Self-pay

## 2021-06-09 ENCOUNTER — Encounter: Payer: Self-pay | Admitting: Family Medicine

## 2021-06-09 ENCOUNTER — Encounter: Payer: Self-pay | Admitting: Physician Assistant

## 2021-06-09 ENCOUNTER — Ambulatory Visit: Payer: BLUE CROSS/BLUE SHIELD | Admitting: Physician Assistant

## 2021-06-09 VITALS — BP 121/82 | HR 86 | Ht 62.5 in | Wt 150.4 lb

## 2021-06-09 DIAGNOSIS — F418 Other specified anxiety disorders: Secondary | ICD-10-CM

## 2021-06-09 DIAGNOSIS — M19042 Primary osteoarthritis, left hand: Secondary | ICD-10-CM

## 2021-06-09 DIAGNOSIS — Z79899 Other long term (current) drug therapy: Secondary | ICD-10-CM | POA: Diagnosis not present

## 2021-06-09 DIAGNOSIS — M720 Palmar fascial fibromatosis [Dupuytren]: Secondary | ICD-10-CM

## 2021-06-09 DIAGNOSIS — E785 Hyperlipidemia, unspecified: Secondary | ICD-10-CM

## 2021-06-09 DIAGNOSIS — M4125 Other idiopathic scoliosis, thoracolumbar region: Secondary | ICD-10-CM

## 2021-06-09 DIAGNOSIS — Z8739 Personal history of other diseases of the musculoskeletal system and connective tissue: Secondary | ICD-10-CM

## 2021-06-09 DIAGNOSIS — M0600 Rheumatoid arthritis without rheumatoid factor, unspecified site: Secondary | ICD-10-CM

## 2021-06-09 DIAGNOSIS — M19071 Primary osteoarthritis, right ankle and foot: Secondary | ICD-10-CM

## 2021-06-09 DIAGNOSIS — R911 Solitary pulmonary nodule: Secondary | ICD-10-CM

## 2021-06-09 DIAGNOSIS — J3089 Other allergic rhinitis: Secondary | ICD-10-CM

## 2021-06-09 DIAGNOSIS — H15103 Unspecified episcleritis, bilateral: Secondary | ICD-10-CM

## 2021-06-09 DIAGNOSIS — R609 Edema, unspecified: Secondary | ICD-10-CM

## 2021-06-09 DIAGNOSIS — M19041 Primary osteoarthritis, right hand: Secondary | ICD-10-CM | POA: Diagnosis not present

## 2021-06-09 DIAGNOSIS — M19072 Primary osteoarthritis, left ankle and foot: Secondary | ICD-10-CM

## 2021-06-09 LAB — COMPLETE METABOLIC PANEL WITH GFR
AG Ratio: 2 (calc) (ref 1.0–2.5)
ALT: 31 U/L — ABNORMAL HIGH (ref 6–29)
AST: 25 U/L (ref 10–35)
Albumin: 4.9 g/dL (ref 3.6–5.1)
Alkaline phosphatase (APISO): 115 U/L (ref 37–153)
BUN/Creatinine Ratio: 12 (calc) (ref 6–22)
BUN: 14 mg/dL (ref 7–25)
CO2: 29 mmol/L (ref 20–32)
Calcium: 10 mg/dL (ref 8.6–10.4)
Chloride: 102 mmol/L (ref 98–110)
Creat: 1.18 mg/dL — ABNORMAL HIGH (ref 0.50–1.03)
Globulin: 2.4 g/dL (calc) (ref 1.9–3.7)
Glucose, Bld: 86 mg/dL (ref 65–99)
Potassium: 3.8 mmol/L (ref 3.5–5.3)
Sodium: 140 mmol/L (ref 135–146)
Total Bilirubin: 0.4 mg/dL (ref 0.2–1.2)
Total Protein: 7.3 g/dL (ref 6.1–8.1)
eGFR: 54 mL/min/{1.73_m2} — ABNORMAL LOW (ref >=60)

## 2021-06-09 LAB — CBC WITH DIFFERENTIAL/PLATELET
Absolute Monocytes: 874 cells/uL (ref 200–950)
Basophils Absolute: 64 cells/uL (ref 0–200)
Basophils Relative: 0.7 %
Eosinophils Absolute: 155 cells/uL (ref 15–500)
Eosinophils Relative: 1.7 %
HCT: 43.8 % (ref 35.0–45.0)
Hemoglobin: 14.8 g/dL (ref 11.7–15.5)
Lymphs Abs: 1929 cells/uL (ref 850–3900)
MCH: 30.6 pg (ref 27.0–33.0)
MCHC: 33.8 g/dL (ref 32.0–36.0)
MCV: 90.7 fL (ref 80.0–100.0)
MPV: 11.7 fL (ref 7.5–12.5)
Monocytes Relative: 9.6 %
Neutro Abs: 6079 cells/uL (ref 1500–7800)
Neutrophils Relative %: 66.8 %
Platelets: 287 10*3/uL (ref 140–400)
RBC: 4.83 10*6/uL (ref 3.80–5.10)
RDW: 13.5 % (ref 11.0–15.0)
Total Lymphocyte: 21.2 %
WBC: 9.1 10*3/uL (ref 3.8–10.8)

## 2021-06-09 MED ORDER — LEFLUNOMIDE 20 MG PO TABS: 20.0000 mg | ORAL_TABLET | Freq: Every day | ORAL | 0 refills | Status: DC

## 2021-06-09 NOTE — Progress Notes (Signed)
Office Visit Note  Patient: Sarah Ford             Date of Birth: 06/28/1961           MRN: [id number]             PCP: Nelwyn Salisbury, MD Referring: Nelwyn Salisbury, MD Visit Date: 06/09/2021 Occupation: @GUAROCC @  Subjective:  Medication monitoring   History of Present Illness: Sarah Ford is a 60 y.o. female with history of seronegative rheumatoid arthritis, episcleritis, and osteoarthritis.  She was started on Arava 10 mg 1 tablet daily after her last office visit on 05/13/2021.  She increased the dose of Arava after her lab work on 05/29/2021.  She has been tolerating Arava without any side effects.  She has not noticed any improvement in her joint pain or stiffness since initiating therapy.  She continues to have aching in both wrist joints especially the left wrist.  She also has noticed a nodule on the palmar aspect of her right hand which has been tender.  She still feels as though both hands are swollen.  On 05/21/2021.  She was evaluated at [facility] ophthalmology due to having severe pain, redness, and periorbital inflammation around the left eye.  [facility] felt that her symptoms were consistent with a scleritis flare.  She was started on prednisone eyedrops which she completed on Monday.  She was evaluated by Dr. Friday and according to the patient her inflammation has completely resolved.  She is not experiencing any signs or symptoms of a flare at this time.  According to the patient this was her first scleritis flare of 2022.    Activities of Daily Living:  Patient reports morning stiffness for 30-45 minutes.   Patient Reports nocturnal pain.  Difficulty dressing/grooming: Denies Difficulty climbing stairs: Denies Difficulty getting out of chair: Denies Difficulty using hands for taps, buttons, cutlery, and/or writing: Denies  Review of Systems  Constitutional:  Positive for fatigue.  HENT:  Negative for mouth sores, mouth dryness and nose dryness.   Eyes:  Negative for  pain, redness, itching and dryness.  Respiratory:  Negative for shortness of breath and difficulty breathing.   Cardiovascular:  Negative for chest pain and palpitations.  Gastrointestinal:  Positive for constipation. Negative for blood in stool and diarrhea.  Endocrine: Positive for increased urination.  Genitourinary:  Negative for difficulty urinating.  Musculoskeletal:  Positive for joint pain, joint pain, joint swelling and morning stiffness. Negative for myalgias, muscle tenderness and myalgias.  Skin:  Negative for color change, rash and redness.  Allergic/Immunologic: Negative for susceptible to infections.  Neurological:  Negative for dizziness, numbness, headaches, memory loss and weakness.  Hematological:  Positive for bruising/bleeding tendency.  Psychiatric/Behavioral:  Negative for confusion.    PMFS History:  Patient Active Problem List   Diagnosis Date Noted   Pulmonary nodules 03/05/2021   Arthralgia 12/10/2020   Peripheral edema 12/10/2020   Chronic right-sided low back pain with right-sided sciatica 01/25/2020   Constipation 10/24/2019   Environmental and seasonal allergies 10/24/2019   Migraine variant 03/09/2010   APHASIA 03/09/2010   INSOMNIA, CHRONIC 05/29/2007   Depression with anxiety 05/29/2007   Osteoporosis 05/29/2007    Past Medical History:  Diagnosis Date   Allergy    Constipation    Depression    IBS (irritable bowel syndrome)    sees Dr. 07/29/2007    Insomnia    Osteoporosis    gets Reclast per [facility]    Routine  gynecological examination    sees Dr. Rana Snare     Family History  Problem Relation Age of Onset   Cancer Mother    Breast cancer Mother    Thyroid disease Mother    Hypertension Mother    High Cholesterol Mother    Myelodysplastic syndrome Maternal Aunt    Heart failure Father    Kidney failure Father    Diabetes Father    Hypertension Father    High Cholesterol Father    Osteoporosis Sister    Alcohol abuse Sister     Mental illness Sister    Healthy Son    Allergies Daughter    Depression Daughter    Past Surgical History:  Procedure Laterality Date   COLONOSCOPY  08/28/2014   per Dr. Kinnie Scales, clear, repeat in 10 yrs    DILATION AND CURETTAGE OF UTERUS     x2   FOOT SURGERY Right    bone spur   HERNIA REPAIR     umbilical   TEMPOROMANDIBULAR JOINT SURGERY  1993   tummy tuck     dr Shon Hough   Social History   Social History Narrative   Not on file   Immunization History  Administered Date(s) Administered   Influenza Inj Mdck Quad With Preservative 06/12/2019   Influenza Split 07/26/2011, 07/05/2012   Influenza Whole 07/05/2007   Influenza,inj,Quad PF,6+ Mos 07/26/2014, 05/30/2015, 06/21/2016, 07/10/2018   Influenza-Unspecified 06/20/2017, 07/04/2020   PFIZER(Purple Top)SARS-COV-2 Vaccination 11/30/2019, 12/22/2019, 07/17/2020, 02/09/2021   PNEUMOCOCCAL CONJUGATE-20 02/19/2021   Tdap 12/14/2012   Zoster Recombinat (Shingrix) 04/10/2018     Objective: Vital Signs: BP 121/82 (BP Location: Left Arm, Patient Position: Sitting, Cuff Size: Normal)   Pulse 86   Ht 5' 2.5" (1.588 m)   Wt 150 lb 6.4 oz (68.2 kg)   BMI 27.07 kg/m    Physical Exam Vitals and nursing note reviewed.  Constitutional:      Appearance: She is well-developed.  HENT:     Head: Normocephalic and atraumatic.  Eyes:     Conjunctiva/sclera: Conjunctivae normal.  Pulmonary:     Effort: Pulmonary effort is normal.  Abdominal:     Palpations: Abdomen is soft.  Musculoskeletal:     Cervical back: Normal range of motion.  Skin:    General: Skin is warm and dry.     Capillary Refill: Capillary refill takes less than 2 seconds.  Neurological:     Mental Status: She is alert and oriented to person, place, and time.  Psychiatric:        Behavior: Behavior normal.     Musculoskeletal Exam: C-spine, thoracic spine, and lumbar spine good ROM.  Shoulder joints, elbow joints, wrist joints, MCPs, PIPs, and DIPs  good ROM with no synovitis.  Complete fist formation.  Dupuytren's contracture right 3rd digit.  PIP and DIP thickening consistent with OA of both hands.  Hip joints, knee joints, and ankle joints have good ROM with no discomfort.  No warmth or effusion of knee joints.  No tenderness or swelling of ankle joints.   CDAI Exam: CDAI Score: -- Patient Global: --; Provider Global: -- Swollen: 0 ; Tender: 0  Joint Exam 06/09/2021   No joint exam has been documented for this visit   There is currently no information documented on the homunculus. Go to the Rheumatology activity and complete the homunculus joint exam.  Investigation: No additional findings.  Imaging: No results found.  Recent Labs: Lab Results  Component Value Date   WBC 6.1  05/29/2021   HGB 14.2 05/29/2021   PLT 298 05/29/2021   NA 139 05/29/2021   K 4.6 05/29/2021   CL 104 05/29/2021   CO2 27 05/29/2021   GLUCOSE 79 05/29/2021   BUN 12 05/29/2021   CREATININE 0.94 05/29/2021   BILITOT 0.4 05/29/2021   ALKPHOS 87 12/10/2020   AST 28 05/29/2021   ALT 33 (H) 05/29/2021   PROT 7.0 05/29/2021   ALBUMIN 4.6 12/10/2020   CALCIUM 9.2 05/29/2021   GFRAA 79 03/11/2021   QFTBGOLDPLUS NEGATIVE 02/04/2021    Speciality Comments: No specialty comments available.  Procedures:  No procedures performed Allergies: Hydrocodone-acetaminophen and Tramadol   Assessment / Plan:     Visit Diagnoses: Seronegative rheumatoid arthritis (HCC): H/o recurrent inflammatory arthritis since February 2022, Positive synovitis, RF-, anti-CCP negative, 14-3-3 eta negative, h/o of episcleritis: She has no synovitis on examination today.  She continues to have ongoing pain, stiffness, and intermittent inflammation in both hands and both wrist joints.  She was previously taking methotrexate but discontinued due to elevated LFTs and elevated creatinine.  She was started on [place] after her last office visit on 05/13/2021.  She took 10 mg of Arava for  about 2 weeks and a increased to 20 mg after her most recent lab work on 05/29/2021.  She had a flare of scleritis in the left eye at the beginning of September 2022 and has been following up closely with Dr. Cathey Endow.  She completed the course of prednisone eyedrops on Monday and has not had any recurrence of symptoms.  According to the patient she has not had any other flares this year.  Discussed that if she continues to have recurrent flares pain or inflammation she will likely require combination therapy with Humira.  She would like to give Arava more time and reevaluate the true efficacy at her upcoming follow-up visit.  She will remain on Arava 20 mg 1 tablet by mouth daily.  High risk medication use - Arava started after last OV on 05/13/21.  She is currently taking Arava 20 mg 1 tablet by mouth daily.  CBC and CMP will be drawn today to monitor for drug toxicity on the increased dose of Arava.  She will continue to require close lab monitoring 3 months.- Plan: COMPLETE METABOLIC PANEL WITH GFR, CBC with Differential/Platelet Discontinued methotrexate due to elevated LFTs and elevated creatinine. She has not had any recent infections.  We discussed the importance of holding Arava if she develops signs or symptoms of an infection and to resume once infection has completely cleared.  Episcleritis of both eyes -Followed by Dr. Cathey Endow.  She had a recent flare in the left eye at the beginning of September 2022.  She was prescribed prednisone eyedrops by her ophthalmologist which has resolved her symptoms.  She did not require oral prednisone to resolve the flare.  According to the patient this has been her first flare in 2022. She was previously on methotrexate which was initiated in May 2022 but had to discontinue due to elevated LFTs and elevated creatinine.  She was started on Arava after her last office visit on 05/13/2021 which she has been tolerating without any side effects.  Discussed that with her history  of recurrent episcleritis she may require combination therapy with Humira.  She does not want to start on combination therapy at this time since it was of the year and resolved with steroid drops.  She will remain on her right as prescribed.  She will  keep her follow-up visit in October and we will reevaluate at that time.  Primary osteoarthritis of both hands: She has PIP and DIP thickening consistent with osteoarthritis of both hands.  She continues to have ongoing stiffness and aching in both hands.  No synovitis was noted on examination today.  Dupuytren's contracture of right hand: Mild, 3rd digit.  She has some tenderness to palpation.  Discussed a referral to hand surgery but she declined at this time.   Primary osteoarthritis of both feet: X-rays of both feet were consistent with osteoarthritis.  She continues to experience pain and stiffness in both feet.  She has good range of motion of both ankle joints with no tenderness or inflammation on examination today.  Idiopathic scoliosis of thoracolumbar region: She is not experiencing any increased discomfort at this time.   Nodule of lower lobe of lung: She underwent a thorough work-up by pulmonary.  CT chest was ordered by Dr. Delton Coombes but has not been scheduled yet.  History of osteoporosis: Treated with IV Reclast (started September 2021) by [facility] endocrinology.  Past treatment Fosamax, Actonel and Evista.   Other medical conditions are listed as follows:  Peripheral edema  Dyslipidemia  Depression with anxiety  Environmental and seasonal allergies  Orders: Orders Placed This Encounter  Procedures   COMPLETE METABOLIC PANEL WITH GFR   CBC with Differential/Platelet   Meds ordered this encounter  Medications   leflunomide (ARAVA) 20 MG tablet    Sig: Take 1 tablet (20 mg total) by mouth daily.    Dispense:  90 tablet    Refill:  0     Follow-Up Instructions: Return in about 2 months (around 08/09/2021) for Rheumatoid  arthritis, Osteoarthritis.   Sarah Bienenstock, PA-C  Note - This record has been created using Dragon software.  Chart creation errors have been sought, but may not always  have been located. Such creation errors do not reflect on  the standard of medical care.

## 2021-06-10 NOTE — Progress Notes (Signed)
Dr. Corliss Skains is concerned that arava may be contributing to her elevation in creatinine, so the patient will likely switch to Humira as monotherapy.  She will need to sign consent form prior to initiating therapy.   Devki --please call the patient to discuss the indications, contraindications, and potential side effects of Humira.

## 2021-06-10 NOTE — Progress Notes (Signed)
CBC WNL.  ALT is borderline elevated-31 but improving.  Creatinine was elevated 1.18 and GFR was 54.  Please clarify if she has been taking any NSAIDs.  She was recently switched from methotrexate to Arava due to elevated creatinine and low GFR.  She remains on Lasix on a daily basis which may be contributing as well.    Reviewed lab work with Dr. Corliss Skains.  She recommends applying for Humira and would like to have the patient's start on therapy at her upcoming follow-up visit on 05/07/2021.

## 2021-06-11 ENCOUNTER — Telehealth: Payer: Self-pay | Admitting: Pharmacist

## 2021-06-11 DIAGNOSIS — Z79899 Other long term (current) drug therapy: Secondary | ICD-10-CM

## 2021-06-11 DIAGNOSIS — M0600 Rheumatoid arthritis without rheumatoid factor, unspecified site: Secondary | ICD-10-CM

## 2021-06-11 NOTE — Telephone Encounter (Signed)
Submitted a Prior Authorization request to [facility] for HUMIRA via CoverMyMeds. Will update once we receive a response.   Key: [id number]

## 2021-06-11 NOTE — Telephone Encounter (Signed)
Please start Humira BIV.  Dose: 40mg  every 14 days  Dx: Rheumatoid arthritis (M05.9)  Previously tried therapies: Leflunomide - elevated serum creatinine Methotrexate - elevated LFTs and elevated creatinine  , PharmD, MPH, BCPS Clinical Pharmacist (Rheumatology and Pulmonology)

## 2021-06-11 NOTE — Telephone Encounter (Signed)
Pharmacy Note Subjective: Patient presents today to [facility] for follow up office visit. Patient seen by the pharmacist for counseling on Humira for rheumatoid arthritis.  Prior therapy includes: leflunomide, methotrexate. Leflunomide may have contributed to elevated creatinine.  Diagnosis of heart failure: No  Objective:  CBC    Component Value Date/Time   WBC 9.1 06/09/2021 1500   RBC 4.83 06/09/2021 1500   HGB 14.8 06/09/2021 1500   HCT 43.8 06/09/2021 1500   PLT 287 06/09/2021 1500   MCV 90.7 06/09/2021 1500   MCH 30.6 06/09/2021 1500   MCHC 33.8 06/09/2021 1500   RDW 13.5 06/09/2021 1500   LYMPHSABS 1,929 06/09/2021 1500   MONOABS 0.6 12/10/2020 1528   EOSABS 155 06/09/2021 1500   BASOSABS 64 06/09/2021 1500     CMP     Component Value Date/Time   NA 140 06/09/2021 1500   K 3.8 06/09/2021 1500   CL 102 06/09/2021 1500   CO2 29 06/09/2021 1500   GLUCOSE 86 06/09/2021 1500   BUN 14 06/09/2021 1500   CREATININE 1.18 (H) 06/09/2021 1500   CALCIUM 10.0 06/09/2021 1500   PROT 7.3 06/09/2021 1500   ALBUMIN 4.6 12/10/2020 1528   AST 25 06/09/2021 1500   ALT 31 (H) 06/09/2021 1500   ALKPHOS 87 12/10/2020 1528   BILITOT 0.4 06/09/2021 1500   GFRNONAA 68 03/11/2021 1214   GFRAA 79 03/11/2021 1214      Baseline Immunosuppressant Therapy Labs TB GOLD Quantiferon TB Gold Latest Ref Rng & Units 02/04/2021  Quantiferon TB Gold Plus NEGATIVE NEGATIVE   Hepatitis Panel Hepatitis Latest Ref Rng & Units 02/04/2021  Hep B Surface Ag NON-REACTIVE NON-REACTIVE  Hep B IgM NON-REACTIVE NON-REACTIVE  Hep C Ab NON-REACTIVE NON-REACTIVE  Hep C Ab NON-REACTIVE NON-REACTIVE   HIV Lab Results  Component Value Date   HIV NON-REACTIVE 02/04/2021   Immunoglobulins Immunoglobulin Electrophoresis Latest Ref Rng & Units 02/04/2021  IgA  47 - 310 mg/dL 841  IgG 324 - 4,010 mg/dL 2,725  IgM 50 - 366 mg/dL 59   SPEP Serum Protein Electrophoresis Latest Ref Rng &  Units 06/09/2021  Total Protein 6.1 - 8.1 g/dL 7.3  Albumin 3.8 - 4.8 g/dL -  Alpha-1 0.2 - 0.3 g/dL -  Alpha-2 0.5 - 0.9 g/dL -  Beta Globulin 0.4 - 0.6 g/dL -  Beta 2 0.2 - 0.5 g/dL -  Gamma Globulin 0.8 - 1.7 g/dL -   Y4IH No results found for: G6PDH TPMT No results found for: TPMT   Chest x-ray: 02/18/21 at [facility] - pulmonary nodules on CXR and followed by Dr. Delton Coombes at [facility] pulmonology  Assessment/Plan:  Counseled patient that Humira is a TNF blocking agent.  Counseled patient on purpose, proper use, and adverse effects of Humira.  Reviewed the most common adverse effects including infections, headache, and injection site reactions. Discussed that there is the possibility of an increased risk of malignancy including non-melanoma skin cancer but it is not well understood if this increased risk is due to the medication or the disease state.  Advised patient to get yearly dermatology exams due to risk of skin cancer. Counseled patient that Humira should be held prior to scheduled surgery.  Counseled patient to avoid live vaccines while on Humira.  Recommend annual influenza, PCV 15 or PCV20 or Pneumovax 23, and Shingrix as indicated.  Reviewed the importance of regular labs while on Humira therapy. Will monitor CBC and CMP 1 month after starting and then  every 3 months routinely thereafter. Will monitor TB gold annually. Standing orders placed. Provided patient with medication education material and answered all questions.  Patient consented to Humira.  Will upload consent into the media tab.  Reviewed storage instructions of Humira.  Advised initial injection must be administered in office (aiming to start Humira at visit on 07/07/21).  Patient verbalized understanding.   Dermatology referral was placed today. for [facility]. Patient wants to be seen sooner.  Dose will be for rheumatoid arthritis Humira 40 mg every 14 days.  Prescription pending insurance approval. Plan is to start  Humira at office visit on 07/07/21. She provided VERBAL consent today, but will need written consent on 07/07/21  Chesley Mires, PharmD, MPH, BCPS Clinical Pharmacist (Rheumatology and Pulmonology)

## 2021-06-12 ENCOUNTER — Telehealth: Payer: Self-pay | Admitting: *Deleted

## 2021-06-12 NOTE — Telephone Encounter (Signed)
Patient contacted the office stating she was taking Z-quil. Patient would like to know if that would affect her kidney and liver functions. Please advise.

## 2021-06-12 NOTE — Telephone Encounter (Signed)
Spoke with patient and advised On reviewing Z-quil has diphenhydramine which should not affect liver or kidney function.  Patient advised she should avoid NSAIDs and Tylenol. Patient expressed understanding.

## 2021-06-12 NOTE — Telephone Encounter (Signed)
On reviewing Z-quil has diphenhydramine which should not affect liver or kidney function.  She should avoid NSAIDs and Tylenol.

## 2021-06-15 ENCOUNTER — Encounter: Payer: Self-pay | Admitting: Family Medicine

## 2021-06-15 NOTE — Telephone Encounter (Signed)
Received a fax regarding Prior Authorization from [facility] for HUMIRA. Authorization has been DENIED because patient must have one of the following: - have tried MTX up to 25mg  weekly x 3 months and it did not work - have tried standard therapy (hydroxychloroquine, leflunomide, sulfasalazine) for at least 3 months and it did not work - have tried at least one standard med (MTX, hydroxychloroquin, etc)a and could not tolerate - have a reason why they cannot try ALL above the standard meds  Phone# [phone]  Will submit additional documentation to fax [phone] or call [phone] ext [phone]  [phone], PharmD, MPH, BCPS Clinical Pharmacist (Rheumatology and Pulmonology)

## 2021-06-15 NOTE — Telephone Encounter (Signed)
Submitted additional clinical information to [facility] including chart notes  Fax: [phone] Phone: [phone]  Chesley Mires, PharmD, MPH, BCPS Clinical Pharmacist (Rheumatology and Pulmonology)

## 2021-06-17 DIAGNOSIS — M720 Palmar fascial fibromatosis [Dupuytren]: Secondary | ICD-10-CM

## 2021-06-17 DIAGNOSIS — M19041 Primary osteoarthritis, right hand: Secondary | ICD-10-CM

## 2021-06-18 ENCOUNTER — Encounter: Payer: Self-pay | Admitting: Family Medicine

## 2021-06-18 NOTE — Telephone Encounter (Signed)
We are awaiting approval for Humira.  Please clarify if she is taking arava with food.   She can reduce the dose of arava back to 10 mg daily if she cannot tolerate the increased dose.

## 2021-06-18 NOTE — Telephone Encounter (Signed)
Ok to place referral to [facility]-Dr. Amanda Pea.

## 2021-06-19 ENCOUNTER — Other Ambulatory Visit (HOSPITAL_COMMUNITY): Payer: Self-pay

## 2021-06-19 MED ORDER — HUMIRA (2 PEN) 40 MG/0.4ML ~~LOC~~ AJKT
40.0000 mg | AUTO-INJECTOR | SUBCUTANEOUS | 0 refills | Status: DC
  Filled 2021-06-19: qty 2, 28d supply, fill #0
  Filled 2021-06-19: qty 2, fill #0

## 2021-06-19 NOTE — Telephone Encounter (Signed)
Shipment schedule to courier to clinic on 10/3

## 2021-06-19 NOTE — Telephone Encounter (Signed)
Ran test claim, patient's copay for 1 month supply is $416.20 with evoucher. Patient will need Humira copay card for additional funds.  Patient can fill at [facility]  Enrolled patient in Humira copay card:  Card: [id number]  Rx GROUP: [id number]  Rx BIN: [id number]  Rx PCN: [id number]

## 2021-06-19 NOTE — Telephone Encounter (Signed)
Rx for Humira sent to [facility] to be couriered to clinic.  Called patient and scheduled her for Humira new start on 06/24/21  Chesley Mires, PharmD, MPH, BCPS Clinical Pharmacist (Rheumatology and Pulmonology)

## 2021-06-19 NOTE — Telephone Encounter (Signed)
Received notification from [facility] regarding a prior authorization for HUMIRA. Authorization has been APPROVED from 06/11/21 to 06/14/22.   Authorization # [id number] Phone # [phone]

## 2021-06-22 ENCOUNTER — Other Ambulatory Visit (HOSPITAL_COMMUNITY): Payer: Self-pay

## 2021-06-22 NOTE — Telephone Encounter (Addendum)
Humira received in the office and placed in fridge #1. Thanks!

## 2021-06-23 NOTE — Progress Notes (Signed)
Pharmacy Note  Subjective:   Patient presents to clinic today to receive first dose of Humira. She previously tried MTX but had elevated LFTs and elevated creatinine. She also tried leflunomide thereafter with elevated creatinine and persistent nausea. Plan was made to start Humira as monotherapy.  Patient running a fever or have signs/symptoms of infection? No  Patient currently on antibiotics for the treatment of infection? No  Patient have any upcoming invasive procedures/surgeries? No  Objective: CMP     Component Value Date/Time   NA 140 06/09/2021 1500   K 3.8 06/09/2021 1500   CL 102 06/09/2021 1500   CO2 29 06/09/2021 1500   GLUCOSE 86 06/09/2021 1500   BUN 14 06/09/2021 1500   CREATININE 1.18 (H) 06/09/2021 1500   CALCIUM 10.0 06/09/2021 1500   PROT 7.3 06/09/2021 1500   ALBUMIN 4.6 12/10/2020 1528   AST 25 06/09/2021 1500   ALT 31 (H) 06/09/2021 1500   ALKPHOS 87 12/10/2020 1528   BILITOT 0.4 06/09/2021 1500   GFRNONAA 68 03/11/2021 1214   GFRAA 79 03/11/2021 1214    CBC    Component Value Date/Time   WBC 9.1 06/09/2021 1500   RBC 4.83 06/09/2021 1500   HGB 14.8 06/09/2021 1500   HCT 43.8 06/09/2021 1500   PLT 287 06/09/2021 1500   MCV 90.7 06/09/2021 1500   MCH 30.6 06/09/2021 1500   MCHC 33.8 06/09/2021 1500   RDW 13.5 06/09/2021 1500   LYMPHSABS 1,929 06/09/2021 1500   MONOABS 0.6 12/10/2020 1528   EOSABS 155 06/09/2021 1500   BASOSABS 64 06/09/2021 1500    Baseline Immunosuppressant Therapy Labs TB GOLD Quantiferon TB Gold Latest Ref Rng & Units 02/04/2021  Quantiferon TB Gold Plus NEGATIVE NEGATIVE   Hepatitis Panel Hepatitis Latest Ref Rng & Units 02/04/2021  Hep B Surface Ag NON-REACTIVE NON-REACTIVE  Hep B IgM NON-REACTIVE NON-REACTIVE  Hep C Ab NON-REACTIVE NON-REACTIVE  Hep C Ab NON-REACTIVE NON-REACTIVE   HIV Lab Results  Component Value Date   HIV NON-REACTIVE 02/04/2021   Immunoglobulins Immunoglobulin Electrophoresis Latest  Ref Rng & Units 02/04/2021  IgA  47 - 310 mg/dL 086  IgG 578 - 4,696 mg/dL 2,952  IgM 50 - 841 mg/dL 59   SPEP Serum Protein Electrophoresis Latest Ref Rng & Units 06/09/2021  Total Protein 6.1 - 8.1 g/dL 7.3  Albumin 3.8 - 4.8 g/dL -  Alpha-1 0.2 - 0.3 g/dL -  Alpha-2 0.5 - 0.9 g/dL -  Beta Globulin 0.4 - 0.6 g/dL -  Beta 2 0.2 - 0.5 g/dL -  Gamma Globulin 0.8 - 1.7 g/dL -   Chest x-ray: 11/20/42 at [facility] - pulmonary nodules on CXR and followed by Dr. Delton Coombes at [facility] pulmonology  Assessment/Plan:  Patient was counseled about Humira on the telephone on 06/11/21 (documented in telephone note). She verbally consented to start Humira therapy during that call. Written consent for Humira signed signed today  Demonstrated proper injection technique with Humira demo device  Patient able to demonstrate proper injection technique using the teach back method.  Patient self injected in the left lower abdomen with:  [facility]-Supplied Medication: Humira 40mg /0.76mL autoinjector pen NDC: 11m Lot: [id number] Expiration: 10/2022  Patient tolerated well.  Observed for 30 mins in office for adverse reaction and none noted.   Patient is to return in 1 month for labs and 6-8 weeks for follow-up appointment. Appt rescheduled to 08/12/21.  Standing orders for CBC w/ diff and CMP w/ GFR remain in place.  Humira approved through insurance .   Rx sent to: [facility]: [phone] .  Patient provided with pharmacy phone number and advised that pharmacy will call in approximately 2 weeks to schedule shipment to home. She was provided with one additional Humira pen to take home for dose that is due on 07/08/21  She will continue Humira 40mg  SQ every 14 days as monotherapy. Leflunomide discontinued from her medication list today.  She has been advised ot reach out to PCP, Dr. , in regards to diuretic dosing and f/u. All questions encouraged and answered.  Instructed  patient to call with any further questions or concerns.  Clent Ridges, PharmD, MPH, BCPS Clinical Pharmacist (Rheumatology and Pulmonology)  06/23/2021 8:34 AM

## 2021-06-23 NOTE — Patient Instructions (Signed)
Your next HUMIRA dose is due on 07/08/21, 07/22/21, and every 14 days thereafter  STOP leflunomide (Arava).  HOLD HUMIRA if you have signs or symptoms of an infection. You can resume once you feel better or back to your baseline. HOLD HUMIRA if you start antibiotics to treat an infection. HOLD HUMIRA around the time of surgery/procedures. Your surgeon will be able to provide recommendations on when to hold BEFORE and when you are cleared to RESUME.  Pharmacy information: Your prescription will be shipped from [facility]. Their phone number is [phone] They will call you to schedule shipment and confirm address. They will mail your medication to your home.  Cost information: Your copay should be affordable. If you call the pharmacy and it is not affordable, please double-check that they are billing through your copay card as secondary coverage. That copay card information is: ID: [id number] Rx GROUP: [id number] Rx BIN: [id number] Rx PCN: [id number]  Labs are due in 1 month then every 3 months. Lab hours are from Monday to Thursday 1:30-4:30pm and Friday 1:30-4pm. You do not need an appointment if you come for labs during these times.  How to manage an injection site reaction: Remember the 5 C's: COUNTER - leave on the counter at least 30 minutes but up to overnight to bring medication to room temperature. This may help prevent stinging COLD - place something cold (like an ice gel pack or cold water bottle) on the injection site just before cleansing with alcohol. This may help reduce pain CLARITIN - use Claritin (generic name is loratadine) for the first two weeks of treatment or the day of, the day before, and the day after injecting. This will help to minimize injection site reactions CORTISONE CREAM - apply if injection site is irritated and itching CALL ME - if injection site reaction is bigger than the size of your fist, looks infected, blisters, or if you  develop hives

## 2021-06-24 ENCOUNTER — Other Ambulatory Visit: Payer: Self-pay

## 2021-06-24 ENCOUNTER — Encounter: Payer: Self-pay | Admitting: Rheumatology

## 2021-06-24 ENCOUNTER — Other Ambulatory Visit (HOSPITAL_COMMUNITY): Payer: Self-pay

## 2021-06-24 ENCOUNTER — Ambulatory Visit: Payer: BLUE CROSS/BLUE SHIELD | Admitting: Pharmacist

## 2021-06-24 VITALS — BP 132/90 | HR 86

## 2021-06-24 DIAGNOSIS — Z7189 Other specified counseling: Secondary | ICD-10-CM

## 2021-06-24 DIAGNOSIS — M0600 Rheumatoid arthritis without rheumatoid factor, unspecified site: Secondary | ICD-10-CM

## 2021-06-24 DIAGNOSIS — Z79899 Other long term (current) drug therapy: Secondary | ICD-10-CM

## 2021-06-24 MED ORDER — HUMIRA (2 PEN) 40 MG/0.4ML ~~LOC~~ AJKT
40.0000 mg | AUTO-INJECTOR | SUBCUTANEOUS | 0 refills | Status: DC
  Filled 2021-06-24: qty 6, 84d supply, fill #0
  Filled 2021-07-15: qty 2, 28d supply, fill #0
  Filled 2021-08-14: qty 2, 28d supply, fill #1
  Filled 2021-09-09: qty 2, 28d supply, fill #2

## 2021-06-25 ENCOUNTER — Ambulatory Visit: Payer: BLUE CROSS/BLUE SHIELD | Admitting: Orthopedic Surgery

## 2021-06-25 NOTE — Telephone Encounter (Signed)
Referral changed, referral faxed, pending appt

## 2021-06-26 ENCOUNTER — Other Ambulatory Visit: Payer: Self-pay

## 2021-06-26 ENCOUNTER — Encounter: Payer: Self-pay | Admitting: Family Medicine

## 2021-06-26 ENCOUNTER — Ambulatory Visit (INDEPENDENT_AMBULATORY_CARE_PROVIDER_SITE_OTHER): Payer: BLUE CROSS/BLUE SHIELD | Admitting: Family Medicine

## 2021-06-26 VITALS — BP 110/78 | HR 76 | Temp 98.3°F | Wt 146.0 lb

## 2021-06-26 DIAGNOSIS — G4700 Insomnia, unspecified: Secondary | ICD-10-CM

## 2021-06-26 DIAGNOSIS — R609 Edema, unspecified: Secondary | ICD-10-CM

## 2021-06-26 DIAGNOSIS — M0600 Rheumatoid arthritis without rheumatoid factor, unspecified site: Secondary | ICD-10-CM

## 2021-06-26 DIAGNOSIS — F418 Other specified anxiety disorders: Secondary | ICD-10-CM | POA: Diagnosis not present

## 2021-06-26 DIAGNOSIS — R600 Localized edema: Secondary | ICD-10-CM

## 2021-06-26 MED ORDER — LORAZEPAM 2 MG PO TABS: 4.0000 mg | ORAL_TABLET | Freq: Every day | ORAL | 5 refills | Status: DC

## 2021-06-26 MED ORDER — FUROSEMIDE 20 MG PO TABS
20.0000 mg | ORAL_TABLET | Freq: Every day | ORAL | 11 refills | Status: DC | PRN
Start: 2021-06-26 — End: 2022-09-24

## 2021-06-26 NOTE — Progress Notes (Signed)
   Subjective:    Patient ID: Sarah Ford, female    DOB: Sep 10, 1961, 60 y.o.   MRN: [id number]  HPI Here to follow up on leg edema and insomnia. She uses 4 mg of Lorazepam at bedtime for insomnia, and this has been working very well for her. Also her leg swelling has been much better, and she thinks we can cut back on the dose of Lasix. She sees Dr. Thedora Hinders for RA, and she had tried Methotrexate and Arava in the past. Now she was recently started on Humira.    Review of Systems  Constitutional: Negative.   Respiratory: Negative.    Cardiovascular:  Positive for leg swelling. Negative for chest pain and palpitations.  Psychiatric/Behavioral: Negative.        Objective:   Physical Exam Constitutional:      Appearance: Normal appearance.  Cardiovascular:     Rate and Rhythm: Normal rate and regular rhythm.     Pulses: Normal pulses.     Heart sounds: Normal heart sounds.  Pulmonary:     Effort: Pulmonary effort is normal.     Breath sounds: Normal breath sounds.  Musculoskeletal:     Comments: Trace edema in both ankles   Neurological:     Mental Status: She is alert.          Assessment & Plan:  Her insomnia is stable so we refilled the Lorazepam. We will decrease the Lasix to 20 mg on an as needed basis. Gershon Crane, MD

## 2021-06-29 ENCOUNTER — Ambulatory Visit: Payer: BLUE CROSS/BLUE SHIELD | Admitting: Family Medicine

## 2021-07-04 ENCOUNTER — Encounter: Payer: Self-pay | Admitting: Rheumatology

## 2021-07-07 ENCOUNTER — Encounter: Payer: Self-pay | Admitting: Rheumatology

## 2021-07-07 ENCOUNTER — Ambulatory Visit: Payer: BLUE CROSS/BLUE SHIELD | Admitting: Rheumatology

## 2021-07-15 ENCOUNTER — Other Ambulatory Visit (HOSPITAL_COMMUNITY): Payer: Self-pay

## 2021-07-16 ENCOUNTER — Other Ambulatory Visit: Payer: Self-pay | Admitting: Obstetrics and Gynecology

## 2021-07-16 ENCOUNTER — Other Ambulatory Visit (HOSPITAL_COMMUNITY): Payer: Self-pay

## 2021-07-16 DIAGNOSIS — Z803 Family history of malignant neoplasm of breast: Secondary | ICD-10-CM

## 2021-07-17 ENCOUNTER — Encounter: Payer: Self-pay | Admitting: Rheumatology

## 2021-07-17 NOTE — Telephone Encounter (Signed)
Sarah Ford may get the COVID-vaccine booster.  The previous vaccinations do not count to her status post.  The best time to get the booster will be right after the Humira dose.

## 2021-07-29 ENCOUNTER — Telehealth: Payer: Self-pay

## 2021-07-29 ENCOUNTER — Other Ambulatory Visit: Payer: Self-pay | Admitting: *Deleted

## 2021-07-29 DIAGNOSIS — M0600 Rheumatoid arthritis without rheumatoid factor, unspecified site: Secondary | ICD-10-CM

## 2021-07-29 DIAGNOSIS — Z79899 Other long term (current) drug therapy: Secondary | ICD-10-CM

## 2021-07-29 NOTE — Telephone Encounter (Signed)
Patient called requesting labwork orders be sent to [facility] on [address].  Patient plans to go on Friday, 07/31/21.

## 2021-07-29 NOTE — Progress Notes (Signed)
Office Visit Note  Patient: Sarah Ford             Date of Birth: 1961-04-20           MRN: [id number]             PCP: Nelwyn Salisbury, MD Referring: Nelwyn Salisbury, MD Visit Date: 08/12/2021 Occupation: @GUAROCC @  Subjective:  Medication monitoring  History of Present Illness: Sarah Ford is a 60 y.o. female with history of seronegative rheumatoid arthritis, episcleritis of both eyes, and osteoarthritis.  She is currently on Humira 40 mg subcutaneous injections every 14 days.  She initiated Humira on 06/24/2021.  She previously was on methotrexate but discontinued due to elevated creatinine and elevated LFTs.  She has been tolerating Humira without any side effects or injection site reactions.  She states that 1 week ago with her last injection there was a mishap and she could not receive the full dose.  She states that overall she has noticed a significant improvement in her joint pain and stiffness since starting on the Humira.  She continues to experience intermittent stiffness in both hands but has no joint swelling.  She denies any episcleritis flares since starting on Humira. She denies any recent infections.  She is up-to-date on all vaccinations including the shingles vaccine, pneumonia vaccine, flu vaccine, and COVID-19 vaccines.  She has an appointment with her dermatologist in January 2023.     Activities of Daily Living:  Patient reports morning stiffness 0 minutes  Patient Reports nocturnal pain.  Difficulty dressing/grooming: Denies Difficulty climbing stairs: Denies Difficulty getting out of chair: Denies Difficulty using hands for taps, buttons, cutlery, and/or writing: Denies  Review of Systems  Constitutional:  Positive for fatigue.  HENT:  Positive for mouth dryness.   Eyes:  Negative for dryness.  Respiratory:  Negative for shortness of breath.   Cardiovascular:  Positive for swelling in legs/feet.  Gastrointestinal:  Negative for constipation.  Endocrine:  Positive for excessive thirst.  Genitourinary:  Negative for difficulty urinating.  Musculoskeletal:  Positive for joint pain, joint pain, joint swelling and morning stiffness.  Skin:  Negative for rash.  Allergic/Immunologic: Negative for susceptible to infections.  Neurological:  Negative for numbness.  Hematological:  Positive for bruising/bleeding tendency.  Psychiatric/Behavioral:  Negative for sleep disturbance.    PMFS History:  Patient Active Problem List   Diagnosis Date Noted   Seronegative rheumatoid arthritis (HCC) 06/26/2021   Pulmonary nodules 03/05/2021   Arthralgia 12/10/2020   Peripheral edema 12/10/2020   Chronic right-sided low back pain with right-sided sciatica 01/25/2020   Constipation 10/24/2019   Environmental and seasonal allergies 10/24/2019   Migraine variant 03/09/2010   APHASIA 03/09/2010   INSOMNIA, CHRONIC 05/29/2007   Depression with anxiety 05/29/2007   Osteoporosis 05/29/2007    Past Medical History:  Diagnosis Date   Allergy    Arthritis    Constipation    Depression    IBS (irritable bowel syndrome)    sees Dr. 07/29/2007    Insomnia    Osteoporosis    gets Reclast per [facility]    Routine gynecological examination    sees Dr. [facility]    Seronegative rheumatoid arthritis [facility])    sees Dr. [facility]    Family History  Problem Relation Age of Onset   Cancer Mother    Breast cancer Mother    Thyroid disease Mother    Hypertension Mother    High Cholesterol Mother    Myelodysplastic syndrome Maternal  Aunt    Heart failure Father    Kidney failure Father    Diabetes Father    Hypertension Father    High Cholesterol Father    Osteoporosis Sister    Alcohol abuse Sister    Mental illness Sister    Healthy Son    Allergies Daughter    Depression Daughter    Past Surgical History:  Procedure Laterality Date   COLONOSCOPY  08/28/2014   per Dr. Earlean Shawl, clear, repeat in 98 yrs    [place]     x2    FOOT SURGERY Right    bone spur   HERNIA REPAIR     umbilical   TEMPOROMANDIBULAR JOINT SURGERY  1993   tummy tuck     dr Towanda Malkin   Social History   Social History Narrative   Not on file   Immunization History  Administered Date(s) Administered   Influenza Inj Mdck Quad With Preservative 06/12/2019   Influenza Split 07/26/2011, 07/05/2012   Influenza Whole 07/05/2007   Influenza,inj,Quad PF,6+ Mos 07/26/2014, 05/30/2015, 06/21/2016, 07/10/2018   Influenza-Unspecified 06/20/2017, 07/04/2020, 06/15/2021   PFIZER(Purple Top)SARS-COV-2 Vaccination 11/30/2019, 12/22/2019, 07/17/2020, 02/09/2021   PNEUMOCOCCAL CONJUGATE-20 02/19/2021   Tdap 12/14/2012   Zoster Recombinat (Shingrix) 04/10/2018     Objective: Vital Signs: BP 129/85 (BP Location: Left Arm, Patient Position: Sitting, Cuff Size: Small)   Pulse 79   Resp 12   Ht 5' 2.5" (1.588 m)   Wt 142 lb 9.6 oz (64.7 kg)   BMI 25.67 kg/m    Physical Exam Vitals and nursing note reviewed.  Constitutional:      Appearance: She is well-developed.  HENT:     Head: Normocephalic and atraumatic.  Eyes:     Conjunctiva/sclera: Conjunctivae normal.  Pulmonary:     Effort: Pulmonary effort is normal.  Abdominal:     Palpations: Abdomen is soft.  Musculoskeletal:     Cervical back: Normal range of motion.  Skin:    General: Skin is warm and dry.     Capillary Refill: Capillary refill takes less than 2 seconds.  Neurological:     Mental Status: She is alert and oriented to person, place, and time.  Psychiatric:        Behavior: Behavior normal.     Musculoskeletal Exam: C-spine, thoracic spine, lumbar spine good range of motion with no discomfort.  Shoulder joints, elbow joints, wrist joints, MCPs, PIPs, and DIPs have good range of motion with no synovitis. Complete fist formation bilaterally.  Dupuytren's contracture right third digit.  PIP and DIP thickening consistent with osteoarthritis of both hands.  Hip joints have  good range of motion with no groin pain.  Both knee joints have good range of motion with no warmth or effusion.  Ankle joints have good range of motion with no tenderness or joint swelling.   CDAI Exam: CDAI Score: 0.4  Patient Global: 2 mm; Provider Global: 2 mm Swollen: 0 ; Tender: 0  Joint Exam 08/12/2021   No joint exam has been documented for this visit   There is currently no information documented on the homunculus. Go to the Rheumatology activity and complete the homunculus joint exam.  Investigation: No additional findings.  Imaging: MR BREAST W & WO CM SCREENING (GI)  Result Date: 08/03/2021 CLINICAL DATA:  Abbreviated Breast MRI for breast cancer screening. Patient's mother had breast cancer at the age of 76. LABS:  None obtained at the time of imaging. EXAM: BILATERAL ABBREVIATED  BREAST MRI WITH AND WITHOUT CONTRAST TECHNIQUE: Multiplanar, multisequence MR images of both breasts were obtained prior to and following the intravenous administration of 6 ml of Gadavist Three-dimensional MR images were rendered by post-processing of the original MR data on an independent workstation. The three-dimensional MR images were interpreted, and findings are reported in the following complete MRI report for this study. Three dimensional images were evaluated at the independent DynaCad workstation COMPARISON:  Previous exam(s). FINDINGS: Breast composition: c. Heterogeneous fibroglandular tissue. Background parenchymal enhancement: Moderate. Right breast: No mass or abnormal enhancement. Left breast: No mass or abnormal enhancement. Lymph nodes: No abnormal appearing lymph nodes. Ancillary findings:  None. IMPRESSION: No abnormal enhancement seen in either breast. RECOMMENDATION: Recommend annual screening mammography. The patient is also eligible for annual Abbreviated Breast MRI if she wishes. BI-RADS CATEGORY  1: Negative. Electronically Signed   By: Lillia Mountain M.D.   On: 08/03/2021 11:36    Recent Labs: Lab Results  Component Value Date   WBC 5.5 07/31/2021   HGB 13.7 07/31/2021   PLT 250 07/31/2021   NA 139 07/31/2021   K 4.1 07/31/2021   CL 106 07/31/2021   CO2 24 07/31/2021   GLUCOSE 79 07/31/2021   BUN 13 07/31/2021   CREATININE 1.02 07/31/2021   BILITOT 0.4 07/31/2021   ALKPHOS 87 12/10/2020   AST 21 07/31/2021   ALT 19 07/31/2021   PROT 6.7 07/31/2021   ALBUMIN 4.6 12/10/2020   CALCIUM 9.3 07/31/2021   GFRAA 79 03/11/2021   QFTBGOLDPLUS NEGATIVE 02/04/2021    Speciality Comments: No specialty comments available.  Procedures:  No procedures performed Allergies: Arava [leflunomide], Hydrocodone-acetaminophen, and Tramadol   Assessment / Plan:     Visit Diagnoses: Seronegative rheumatoid arthritis ([place]) - H/o recurrent inflammatory arthritis since February 2022, Positive synovitis, RF-, anti-CCP negative, 14-3-3 eta negative, h/o of episcleritis: She has no synovitis on examination.  She has noticed significant clinical improvement since starting on Humira 40 mg subcutaneous injections every 14 days starting on 06/24/2021.  She has been tolerating Humira without any side effects or injection site reactions.  Her last dose of Humira was administered 1 week ago at which time she had a mishap with the device and did not receive any of the injection.  She has noticed some increased stiffness in both hands this week since missing the dose of Humira.  Overall her morning stiffness and joint inflammation has improved significantly on the current treatment regimen.  She has not had any signs or symptoms of an episcleritis flare since initiating therapy.  She will continue on Humira as prescribed.  She was advised to notify us if she develops increased joint pain or joint swelling.  She will follow-up in the office in 3 months.    High risk medication use - Humira 40 mg sq injections once every 14 days-initiated 06/24/21. (previously on [place], Discontinued methotrexate due  to elevated LFTs and elevated creatinine). CBC and CMP were updated on 07/31/2021.  CBC and CMP were within normal limits at that time.  Her next lab work will be due in January and every 3 months to monitor for drug toxicity. Standing orders for CBC and CMP remain in place.  TB Gold negative on 02/04/2021. She has not had any recent infections.  Discussed the importance of holding Humira if she develops signs or symptoms of an infection and to resume once the infection has completely cleared. She is up-to-date on the shingles vaccine, pneumonia vaccine, COVID-19 vaccines, and  influenza vaccine. Discussed the importance of yearly skin examinations while he manage an increased risk for nonmelanoma skin cancer.  She has an upcoming dermatology appointment in January 2023.  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  She has not had any signs or symptoms of a flare since starting on Humira on 06/24/21. She will remain on the current treatment regimen.   Primary osteoarthritis of both hands: She has PIP and DIP thickening consistent with osteoarthritis of both hands.  No tenderness or inflammation was noted on examination.  Complete fist formation bilaterally.  Discussed the importance of joint protection and muscle strengthening.  Dupuytren's contracture of right hand - Mild, 3rd digit: Unchanged  Primary osteoarthritis of both feet - X-rays of both feet were consistent with osteoarthritis. She is not having any discomfort in her feet at this time.   Idiopathic scoliosis of thoracolumbar region: She experiences intermittent discomfort in her lower back.   Other medical conditions are listed as follows:   Nodule of lower lobe of lung - She underwent a thorough work-up by pulmonary.  CT chest was ordered by Dr. Lamonte Sakai but has not been scheduled yet.  History of osteoporosis - Treated with IV Reclast (started September 2021) by [facility].  Past treatment Fosamax, Actonel and Evista.    Dyslipidemia  Peripheral edema  Environmental and seasonal allergies  Depression with anxiety  Orders: No orders of the defined types were placed in this encounter.  No orders of the defined types were placed in this encounter.     Follow-Up Instructions: Return in about 3 months (around 11/12/2021) for Rheumatoid arthritis, Osteoarthritis.   Ofilia Neas, PA-C  Note - This record has been created using Dragon software.  Chart creation errors have been sought, but may not always  have been located. Such creation errors do not reflect on  the standard of medical care.

## 2021-07-29 NOTE — Telephone Encounter (Signed)
Lab Orders released.  

## 2021-07-31 ENCOUNTER — Other Ambulatory Visit: Payer: Self-pay

## 2021-07-31 ENCOUNTER — Ambulatory Visit
Admission: RE | Admit: 2021-07-31 | Discharge: 2021-07-31 | Disposition: A | Discharge status: Home / Self Care | Payer: No Typology Code available for payment source | Source: Ambulatory Visit | Attending: Obstetrics and Gynecology | Admitting: Obstetrics and Gynecology

## 2021-07-31 DIAGNOSIS — Z803 Family history of malignant neoplasm of breast: Secondary | ICD-10-CM

## 2021-07-31 MED ORDER — GADOBUTROL 1 MMOL/ML IV SOLN
6.0000 mL | Freq: Once | INTRAVENOUS | Status: AC | PRN
  Administered 2021-07-31: 6 mL via INTRAVENOUS

## 2021-08-01 LAB — CBC WITH DIFFERENTIAL/PLATELET
Absolute Monocytes: 506 cells/uL (ref 200–950)
Basophils Absolute: 50 cells/uL (ref 0–200)
Basophils Relative: 0.9 %
Eosinophils Absolute: 154 cells/uL (ref 15–500)
Eosinophils Relative: 2.8 %
HCT: 41.5 % (ref 35.0–45.0)
Hemoglobin: 13.7 g/dL (ref 11.7–15.5)
Lymphs Abs: 2261 cells/uL (ref 850–3900)
MCH: 29.8 pg (ref 27.0–33.0)
MCHC: 33 g/dL (ref 32.0–36.0)
MCV: 90.4 fL (ref 80.0–100.0)
MPV: 11.8 fL (ref 7.5–12.5)
Monocytes Relative: 9.2 %
Neutro Abs: 2530 cells/uL (ref 1500–7800)
Neutrophils Relative %: 46 %
Platelets: 250 10*3/uL (ref 140–400)
RBC: 4.59 10*6/uL (ref 3.80–5.10)
RDW: 12.3 % (ref 11.0–15.0)
Total Lymphocyte: 41.1 %
WBC: 5.5 10*3/uL (ref 3.8–10.8)

## 2021-08-01 LAB — COMPLETE METABOLIC PANEL WITH GFR
AG Ratio: 1.9 (calc) (ref 1.0–2.5)
ALT: 19 U/L (ref 6–29)
AST: 21 U/L (ref 10–35)
Albumin: 4.4 g/dL (ref 3.6–5.1)
Alkaline phosphatase (APISO): 89 U/L (ref 37–153)
BUN: 13 mg/dL (ref 7–25)
CO2: 24 mmol/L (ref 20–32)
Calcium: 9.3 mg/dL (ref 8.6–10.4)
Chloride: 106 mmol/L (ref 98–110)
Creat: 1.02 mg/dL (ref 0.50–1.03)
Globulin: 2.3 g/dL (calc) (ref 1.9–3.7)
Glucose, Bld: 79 mg/dL (ref 65–139)
Potassium: 4.1 mmol/L (ref 3.5–5.3)
Sodium: 139 mmol/L (ref 135–146)
Total Bilirubin: 0.4 mg/dL (ref 0.2–1.2)
Total Protein: 6.7 g/dL (ref 6.1–8.1)
eGFR: 64 mL/min/{1.73_m2} (ref >=60)

## 2021-08-02 NOTE — Progress Notes (Signed)
CBC and CMP are normal.

## 2021-08-11 ENCOUNTER — Other Ambulatory Visit (HOSPITAL_COMMUNITY): Payer: Self-pay

## 2021-08-12 ENCOUNTER — Other Ambulatory Visit: Payer: Self-pay

## 2021-08-12 ENCOUNTER — Ambulatory Visit: Payer: BLUE CROSS/BLUE SHIELD | Admitting: Physician Assistant

## 2021-08-12 ENCOUNTER — Encounter: Payer: Self-pay | Admitting: Physician Assistant

## 2021-08-12 VITALS — BP 129/85 | HR 79 | Resp 12 | Ht 62.5 in | Wt 142.6 lb

## 2021-08-12 DIAGNOSIS — M19042 Primary osteoarthritis, left hand: Secondary | ICD-10-CM

## 2021-08-12 DIAGNOSIS — H15103 Unspecified episcleritis, bilateral: Secondary | ICD-10-CM

## 2021-08-12 DIAGNOSIS — R600 Localized edema: Secondary | ICD-10-CM

## 2021-08-12 DIAGNOSIS — F418 Other specified anxiety disorders: Secondary | ICD-10-CM

## 2021-08-12 DIAGNOSIS — M19041 Primary osteoarthritis, right hand: Secondary | ICD-10-CM

## 2021-08-12 DIAGNOSIS — R911 Solitary pulmonary nodule: Secondary | ICD-10-CM

## 2021-08-12 DIAGNOSIS — M4125 Other idiopathic scoliosis, thoracolumbar region: Secondary | ICD-10-CM

## 2021-08-12 DIAGNOSIS — R609 Edema, unspecified: Secondary | ICD-10-CM

## 2021-08-12 DIAGNOSIS — M720 Palmar fascial fibromatosis [Dupuytren]: Secondary | ICD-10-CM

## 2021-08-12 DIAGNOSIS — Z8739 Personal history of other diseases of the musculoskeletal system and connective tissue: Secondary | ICD-10-CM

## 2021-08-12 DIAGNOSIS — M0600 Rheumatoid arthritis without rheumatoid factor, unspecified site: Secondary | ICD-10-CM

## 2021-08-12 DIAGNOSIS — Z79899 Other long term (current) drug therapy: Secondary | ICD-10-CM | POA: Diagnosis not present

## 2021-08-12 DIAGNOSIS — M19072 Primary osteoarthritis, left ankle and foot: Secondary | ICD-10-CM

## 2021-08-12 DIAGNOSIS — E785 Hyperlipidemia, unspecified: Secondary | ICD-10-CM

## 2021-08-12 DIAGNOSIS — M19071 Primary osteoarthritis, right ankle and foot: Secondary | ICD-10-CM

## 2021-08-12 DIAGNOSIS — J3089 Other allergic rhinitis: Secondary | ICD-10-CM

## 2021-08-12 NOTE — Patient Instructions (Addendum)
Zinc, vitamin D, and vitamin C   Standing Labs We placed an order today for your standing lab work.   Please have your standing labs drawn in January and every 3 months   If possible, please have your labs drawn 2 weeks prior to your appointment so that the provider can discuss your results at your appointment.  Please note that you may see your imaging and lab results in MyChart before we have reviewed them. We may be awaiting multiple results to interpret others before contacting you. Please allow our office up to 72 hours to thoroughly review all of the results before contacting the office for clarification of your results.  We have open lab daily: Monday through Thursday from 1:30-4:30 PM and Friday from 1:30-4:00 PM at the office of Dr. Pollyann Savoy, [facility].   Please be advised, all patients with office appointments requiring lab work will take precedent over walk-in lab work.  If possible, please come for your lab work on Monday and Friday afternoons, as you may experience shorter wait times. The office is located at [address], [place], [place] [address] No appointment is necessary.   Labs are drawn by [facility]. Please bring your co-pay at the time of your lab draw.  You may receive a bill from [facility] for your lab work.  If you wish to have your labs drawn at another location, please call the office 24 hours in advance to send orders.  If you have any questions regarding directions or hours of operation,  please call [phone].   As a reminder, please drink plenty of water prior to coming for your lab work. Thanks!

## 2021-08-14 ENCOUNTER — Other Ambulatory Visit (HOSPITAL_COMMUNITY): Payer: Self-pay

## 2021-08-18 ENCOUNTER — Other Ambulatory Visit: Payer: Self-pay

## 2021-08-18 ENCOUNTER — Encounter: Payer: Self-pay | Admitting: Family Medicine

## 2021-08-18 MED ORDER — ATORVASTATIN CALCIUM 20 MG PO TABS: 20.0000 mg | ORAL_TABLET | Freq: Every day | ORAL | 0 refills | Status: DC

## 2021-09-09 ENCOUNTER — Other Ambulatory Visit (HOSPITAL_COMMUNITY): Payer: Self-pay

## 2021-09-16 ENCOUNTER — Encounter: Payer: Self-pay | Admitting: Family Medicine

## 2021-09-16 ENCOUNTER — Telehealth: Payer: Self-pay | Admitting: Rheumatology

## 2021-09-16 NOTE — Telephone Encounter (Signed)
Patient called the office stating she took her Humira yesterday and tested positive for covid today. Patient states she is unsure what she can take for covid since she is on Humira.

## 2021-09-16 NOTE — Telephone Encounter (Signed)
Spoke with pt state that she got exposed to COVID -19 on christmas from the son who tested positive, pt states that her symptoms are running nose/headaches/sore throat for past 3 days, state denies any other symptom, Pt also state that the daughter Tarra Pence (09/30/1998)tested positive for COVID this morning and is having a scratchy throat and a running nose. Pt wants Dr Clent Ridges to send antiviral for both to the pharmacy. Please advise

## 2021-09-16 NOTE — Telephone Encounter (Signed)
If you test POSITIVE for COVID19 and have MILD to MODERATE symptoms: First, call your PCP if you would like to receive COVID19 treatment AND Hold your medications during the infection and for at least 1 week after your symptoms have resolved: Injectable medication (Benlysta, Cimzia, Cosentyx, Enbrel, Humira, Orencia, Remicade, Simponi, Stelara, Taltz, Tremfya) Methotrexate Leflunomide (Arava) Azathioprine Mycophenolate (Cellcept) Osborne [place], or Rinvoq Otezla If you take Actemra or Kevzara, you DO NOT need to hold these for COVID19 infection.  Patient advised to hold Humira for at least one week after her systems have resolved. Patient advised to contact PCP to discuss treatment for Covid. Patient expressed understanding.

## 2021-09-17 MED ORDER — NIRMATRELVIR/RITONAVIR (PAXLOVID)TABLET: 3.0000 | ORAL_TABLET | Freq: Two times a day (BID) | ORAL | 0 refills | Status: AC

## 2021-09-17 NOTE — Telephone Encounter (Signed)
I sent in Paxlovid for her to take

## 2021-09-20 ENCOUNTER — Other Ambulatory Visit: Payer: Self-pay | Admitting: Family Medicine

## 2021-09-21 ENCOUNTER — Encounter: Payer: Self-pay | Admitting: Rheumatology

## 2021-09-22 ENCOUNTER — Encounter: Payer: Self-pay | Admitting: Family Medicine

## 2021-09-22 MED ORDER — ESCITALOPRAM OXALATE 20 MG PO TABS: 20.0000 mg | ORAL_TABLET | Freq: Two times a day (BID) | ORAL | 0 refills | Status: DC

## 2021-09-22 NOTE — Telephone Encounter (Signed)
Pt requested refill when this pt was on phone with pt's daughter. Medication refilled & appt made for physical. Pt aware to keep scheduled appt for future refills.

## 2021-09-22 NOTE — Telephone Encounter (Signed)
She will get labs couple of weeks after stopping Paxlovid.

## 2021-09-25 ENCOUNTER — Other Ambulatory Visit: Payer: Self-pay

## 2021-09-25 ENCOUNTER — Encounter: Payer: Self-pay | Admitting: Family Medicine

## 2021-09-25 MED ORDER — AZITHROMYCIN 250 MG PO TABS: ORAL_TABLET | ORAL | 0 refills | Status: AC

## 2021-09-25 NOTE — Telephone Encounter (Signed)
Call in a Zpack  ?

## 2021-10-05 ENCOUNTER — Encounter: Payer: Self-pay | Admitting: Family Medicine

## 2021-10-05 ENCOUNTER — Ambulatory Visit (INDEPENDENT_AMBULATORY_CARE_PROVIDER_SITE_OTHER): Payer: BLUE CROSS/BLUE SHIELD | Admitting: Family Medicine

## 2021-10-05 VITALS — BP 124/78 | HR 72 | Temp 99.8°F | Ht 62.5 in | Wt 141.0 lb

## 2021-10-05 DIAGNOSIS — Z0000 Encounter for general adult medical examination without abnormal findings: Secondary | ICD-10-CM | POA: Diagnosis not present

## 2021-10-05 MED ORDER — ATORVASTATIN CALCIUM 20 MG PO TABS: 20.0000 mg | ORAL_TABLET | Freq: Every day | ORAL | 3 refills | Status: DC

## 2021-10-05 MED ORDER — ESCITALOPRAM OXALATE 20 MG PO TABS: 40.0000 mg | ORAL_TABLET | Freq: Every day | ORAL | 3 refills | Status: DC

## 2021-10-05 NOTE — Progress Notes (Signed)
° °  Subjective:    Patient ID: Sarah Ford, female    DOB: [birth year], 61 y.o.   MRN: [id number]  HPI Here for a well exam. She feels good. Her edema has resolved so she no longer takes the Lasix. She sees Dr. Estanislado Pandy for the RA, and she gets Reclast at [facility].   Review of Systems  Constitutional: Negative.   HENT: Negative.    Eyes: Negative.   Respiratory: Negative.    Cardiovascular: Negative.   Gastrointestinal: Negative.   Genitourinary:  Negative for decreased urine volume, difficulty urinating, dyspareunia, dysuria, enuresis, flank pain, frequency, hematuria, pelvic pain and urgency.  Musculoskeletal: Negative.   Skin: Negative.   Neurological: Negative.  Negative for headaches.  Psychiatric/Behavioral: Negative.        Objective:   Physical Exam Constitutional:      General: She is not in acute distress.    Appearance: Normal appearance. She is well-developed.  HENT:     Head: Normocephalic and atraumatic.     Right Ear: External ear normal.     Left Ear: External ear normal.     Nose: Nose normal.     Mouth/Throat:     Pharynx: No oropharyngeal exudate.  Eyes:     General: No scleral icterus.    Conjunctiva/sclera: Conjunctivae normal.     Pupils: Pupils are equal, round, and reactive to light.  Neck:     Thyroid: No thyromegaly.     Vascular: No JVD.  Cardiovascular:     Rate and Rhythm: Normal rate and regular rhythm.     Heart sounds: Normal heart sounds. No murmur heard.   No friction rub. No gallop.  Pulmonary:     Effort: Pulmonary effort is normal. No respiratory distress.     Breath sounds: Normal breath sounds. No wheezing or rales.  Chest:     Chest wall: No tenderness.  Abdominal:     General: Bowel sounds are normal. There is no distension.     Palpations: Abdomen is soft. There is no mass.     Tenderness: There is no abdominal tenderness. There is no guarding or rebound.  Musculoskeletal:        General: No swelling or tenderness. Normal  range of motion.     Cervical back: Normal range of motion and neck supple.  Lymphadenopathy:     Cervical: No cervical adenopathy.  Skin:    General: Skin is warm and dry.     Findings: No erythema or rash.  Neurological:     Mental Status: She is alert and oriented to person, place, and time.     Cranial Nerves: No cranial nerve deficit.     Motor: No abnormal muscle tone.     Coordination: Coordination normal.     Deep Tendon Reflexes: Reflexes are normal and symmetric. Reflexes normal.  Psychiatric:        Behavior: Behavior normal.        Thought Content: Thought content normal.        Judgment: Judgment normal.          Assessment & Plan:  Well exam. We discussed diet and exercise. Get fasting labs soon.  Alysia Penna, MD

## 2021-10-07 ENCOUNTER — Other Ambulatory Visit: Payer: Self-pay | Admitting: Rheumatology

## 2021-10-07 ENCOUNTER — Other Ambulatory Visit (HOSPITAL_COMMUNITY): Payer: Self-pay

## 2021-10-07 DIAGNOSIS — M0600 Rheumatoid arthritis without rheumatoid factor, unspecified site: Secondary | ICD-10-CM

## 2021-10-07 MED ORDER — HUMIRA (2 PEN) 40 MG/0.4ML ~~LOC~~ AJKT
40.0000 mg | AUTO-INJECTOR | SUBCUTANEOUS | 0 refills | Status: DC
  Filled 2021-10-07: qty 6, 84d supply, fill #0
  Filled 2021-10-16: qty 2, 28d supply, fill #0
  Filled 2021-10-21 – 2021-12-10 (×3): qty 2, 28d supply, fill #1
  Filled 2022-01-14: qty 2, 28d supply, fill #2

## 2021-10-07 NOTE — Telephone Encounter (Signed)
Next Visit: 11/20/2021  Last Visit: 08/12/2021  Last Fill: 06/24/2021  DX: Seronegative rheumatoid arthritis  Current Dose per office note 08/12/2021: Humira 40 mg sq injections once every 14 days  Labs: 07/31/2021 CBC and CMP are normal.  TB Gold: 02/04/2021 Neg    Okay to refill Humira?

## 2021-10-09 ENCOUNTER — Other Ambulatory Visit: Payer: Self-pay

## 2021-10-09 DIAGNOSIS — Z79899 Other long term (current) drug therapy: Secondary | ICD-10-CM

## 2021-10-09 DIAGNOSIS — M0600 Rheumatoid arthritis without rheumatoid factor, unspecified site: Secondary | ICD-10-CM

## 2021-10-10 LAB — COMPLETE METABOLIC PANEL WITH GFR
AG Ratio: 1.7 (calc) (ref 1.0–2.5)
ALT: 27 U/L (ref 6–29)
AST: 28 U/L (ref 10–35)
Albumin: 4.4 g/dL (ref 3.6–5.1)
Alkaline phosphatase (APISO): 98 U/L (ref 37–153)
BUN: 15 mg/dL (ref 7–25)
CO2: 31 mmol/L (ref 20–32)
Calcium: 9.5 mg/dL (ref 8.6–10.4)
Chloride: 104 mmol/L (ref 98–110)
Creat: 0.99 mg/dL (ref 0.50–1.03)
Globulin: 2.6 g/dL (calc) (ref 1.9–3.7)
Glucose, Bld: 89 mg/dL (ref 65–99)
Potassium: 4.3 mmol/L (ref 3.5–5.3)
Sodium: 139 mmol/L (ref 135–146)
Total Bilirubin: 0.4 mg/dL (ref 0.2–1.2)
Total Protein: 7 g/dL (ref 6.1–8.1)
eGFR: 66 mL/min/{1.73_m2} (ref >=60)

## 2021-10-10 LAB — CBC WITH DIFFERENTIAL/PLATELET
Absolute Monocytes: 592 cells/uL (ref 200–950)
Basophils Absolute: 32 cells/uL (ref 0–200)
Basophils Relative: 0.5 %
Eosinophils Absolute: 183 cells/uL (ref 15–500)
Eosinophils Relative: 2.9 %
HCT: 42 % (ref 35.0–45.0)
Hemoglobin: 14.2 g/dL (ref 11.7–15.5)
Lymphs Abs: 1625 cells/uL (ref 850–3900)
MCH: 30 pg (ref 27.0–33.0)
MCHC: 33.8 g/dL (ref 32.0–36.0)
MCV: 88.8 fL (ref 80.0–100.0)
MPV: 11.3 fL (ref 7.5–12.5)
Monocytes Relative: 9.4 %
Neutro Abs: 3868 cells/uL (ref 1500–7800)
Neutrophils Relative %: 61.4 %
Platelets: 247 10*3/uL (ref 140–400)
RBC: 4.73 10*6/uL (ref 3.80–5.10)
RDW: 12.5 % (ref 11.0–15.0)
Total Lymphocyte: 25.8 %
WBC: 6.3 10*3/uL (ref 3.8–10.8)

## 2021-10-11 NOTE — Progress Notes (Signed)
CBC and CMP are normal.

## 2021-10-14 ENCOUNTER — Encounter: Payer: Self-pay | Admitting: Family Medicine

## 2021-10-14 ENCOUNTER — Ambulatory Visit (INDEPENDENT_AMBULATORY_CARE_PROVIDER_SITE_OTHER): Payer: BLUE CROSS/BLUE SHIELD | Admitting: Family Medicine

## 2021-10-14 VITALS — BP 122/80 | HR 73 | Temp 98.6°F | Wt 142.0 lb

## 2021-10-14 DIAGNOSIS — Z0000 Encounter for general adult medical examination without abnormal findings: Secondary | ICD-10-CM | POA: Diagnosis not present

## 2021-10-14 DIAGNOSIS — M7122 Synovial cyst of popliteal space [Baker], left knee: Secondary | ICD-10-CM

## 2021-10-14 LAB — LIPID PANEL
Cholesterol: 181 mg/dL (ref 0–200)
HDL: 55.7 mg/dL (ref >39.00)
LDL Cholesterol: 102 mg/dL — ABNORMAL HIGH (ref 0–99)
NonHDL: 125.52
Total CHOL/HDL Ratio: 3
Triglycerides: 116 mg/dL (ref 0.0–149.0)
VLDL: 23.2 mg/dL (ref 0.0–40.0)

## 2021-10-14 LAB — HEMOGLOBIN A1C: Hgb A1c MFr Bld: 5.7 % (ref 4.6–6.5)

## 2021-10-14 LAB — TSH: TSH: 3.6 u[IU]/mL (ref 0.35–5.50)

## 2021-10-14 NOTE — Progress Notes (Signed)
° °  Subjective:    Patient ID: Sarah Ford, female    DOB: 1961-01-23, 61 y.o.   MRN: [id number]  HPI Here to check a painful lump behind the left knee. This appeared about a year ago but has only begum to bother her lately. No hx of trauma.    Review of Systems  Constitutional: Negative.   Respiratory: Negative.    Cardiovascular: Negative.   Musculoskeletal:  Positive for arthralgias.      Objective:   Physical Exam Constitutional:      Appearance: Normal appearance.  Cardiovascular:     Rate and Rhythm: Normal rate and regular rhythm.     Pulses: Normal pulses.     Heart sounds: Normal heart sounds.  Pulmonary:     Effort: Pulmonary effort is normal.     Breath sounds: Normal breath sounds.  Musculoskeletal:     Comments: There is a smooth, round, mobile, tender cystic lesion in the left popliteal space. The knee has full ROM   Neurological:     Mental Status: She is alert.          Assessment & Plan:  Baker's cyst. Refer to Orthopedics to aspirate this. Alysia Penna, MD

## 2021-10-14 NOTE — Addendum Note (Signed)
Addended by: Gershon Crane A on: 10/14/2021 09:41 AM   Modules accepted: Orders

## 2021-10-15 ENCOUNTER — Encounter: Payer: Self-pay | Admitting: Family Medicine

## 2021-10-16 ENCOUNTER — Other Ambulatory Visit (HOSPITAL_COMMUNITY): Payer: Self-pay

## 2021-10-21 ENCOUNTER — Other Ambulatory Visit (HOSPITAL_COMMUNITY): Payer: Self-pay

## 2021-11-04 ENCOUNTER — Telehealth: Payer: Self-pay

## 2021-11-04 DIAGNOSIS — Z0279 Encounter for issue of other medical certificate: Secondary | ICD-10-CM

## 2021-11-04 NOTE — Telephone Encounter (Signed)
Pt Pre-Operative Assessment form has been completed and faxed to [facility]

## 2021-11-05 NOTE — Progress Notes (Signed)
Office Visit Note  Patient: Sarah Ford             Date of Birth: 06-24-1961           MRN: [id number]             PCP: Laurey Morale, MD Referring: Laurey Morale, MD Visit Date: 11/19/2021 Occupation: @GUAROCC @  Subjective:  Medication management  History of Present Illness: Sarah Ford is a 61 y.o. female with a history of seronegative rheumatoid arthritis and osteoarthritis.  She has been on Humira since September 2022.  She states that the medication has been working well for her.  She denies any joint pain or joint swelling.  She states recently she has been having discomfort in her left knee joint.  She was seen by Dr. Lorre Nick who obtained x-rays and advised partial knee replacement.  She is scheduled for partial knee replacement on November 26, 2021.  She stopped Humira a week ago.  She is not had any recent problems with episcleritis.  For osteoporosis she is followed at [facility] endocrinology.  She is scheduled to have Reclast infusion next week.  She was also diagnosed with vitamin D deficiency and was placed on vitamin D 50,000 units once a week.  Activities of Daily Living:  Patient reports morning stiffness for 0  none .   Patient Denies nocturnal pain.  Difficulty dressing/grooming: Denies Difficulty climbing stairs: Denies Difficulty getting out of chair: Denies Difficulty using hands for taps, buttons, cutlery, and/or writing: Denies  Review of Systems  Constitutional:  Positive for fatigue.  HENT:  Negative for mouth dryness.   Eyes:  Negative for dryness.  Respiratory:  Negative for shortness of breath.   Cardiovascular:  Negative for swelling in legs/feet.  Gastrointestinal:  Positive for constipation.  Endocrine: Positive for excessive thirst and increased urination.  Genitourinary:  Negative for difficulty urinating.  Musculoskeletal:  Positive for joint pain and joint pain.  Skin:  Negative for rash.  Allergic/Immunologic: Negative for susceptible to infections.   Neurological:  Negative for weakness.  Hematological:  Negative for bruising/bleeding tendency.  Psychiatric/Behavioral:  Positive for sleep disturbance.    PMFS History:  Patient Active Problem List   Diagnosis Date Noted   Seronegative rheumatoid arthritis ([place]) 06/26/2021   Pulmonary nodules 03/05/2021   Arthralgia 12/10/2020   Peripheral edema 12/10/2020   Chronic right-sided low back pain with right-sided sciatica 01/25/2020   Constipation 10/24/2019   Environmental and seasonal allergies 10/24/2019   Migraine variant 03/09/2010   APHASIA 03/09/2010   INSOMNIA, CHRONIC 05/29/2007   Depression with anxiety 05/29/2007   Osteoporosis 05/29/2007    Past Medical History:  Diagnosis Date   Allergy    Arthritis    Constipation    Depression    IBS (irritable bowel syndrome)    sees Dr. Earlean Shawl    Insomnia    Osteoporosis    gets Reclast per [facility] Endocr[facility]ology    Routine gynecological examination    sees Dr. Corinna Capra    Seronegative rheumatoid arthritis [facility])    sees Dr. Estanislado Pandy    Family History  Problem Relation Age of Onset   Cancer Mother    Breast cancer Mother    Thyroid disease Mother    Hypertension Mother    High Cholesterol Mother    Myelodysplastic syndrome Maternal Aunt    Heart failure Father    Kidney failure Father    Diabetes Father    Hypertension Father    High Cholesterol  Father    Osteoporosis Sister    Alcohol abuse Sister    Mental illness Sister    Healthy Son    Allergies Daughter    Depression Daughter    Past Surgical History:  Procedure Laterality Date   COLONOSCOPY  08/28/2014   per Dr. Kinnie Scales, clear, repeat in 10 yrs    DILATION AND CURETTAGE OF UTERUS     x2   FOOT SURGERY Right    bone spur   HERNIA REPAIR     umbilical   TEMPOROMANDIBULAR JOINT SURGERY  1993   tummy tuck     dr Shon Hough   Social History   Social History Narrative   Not on file   Immunization History  Administered Date(s) Administered   Influenza  Inj Mdck Quad With Preservative 06/12/2019   Influenza Split 07/26/2011, 07/05/2012   Influenza Whole 07/05/2007   Influenza,inj,Quad PF,6+ Mos 07/26/2014, 05/30/2015, 06/21/2016, 07/10/2018   Influenza-Unspecified 06/20/2017, 07/04/2020, 06/15/2021   PFIZER(Purple Top)SARS-COV-2 Vaccination 11/30/2019, 12/22/2019, 07/17/2020, 02/09/2021   PNEUMOCOCCAL CONJUGATE-20 02/19/2021   Tdap 12/14/2012   Zoster Recombinat (Shingrix) 04/10/2018     Objective: Vital Signs: BP 118/78 (BP Location: Left Arm, Patient Position: Sitting, Cuff Size: Normal)    Pulse 82    Resp 15    Ht 5' 2.5" (1.588 m)    Wt 146 lb (66.2 kg)    BMI 26.28 kg/m    Physical Exam Vitals and nursing note reviewed.  Constitutional:      Appearance: She is well-developed.  HENT:     Head: Normocephalic and atraumatic.  Eyes:     Conjunctiva/sclera: Conjunctivae normal.  Cardiovascular:     Rate and Rhythm: Normal rate and regular rhythm.     Heart sounds: Normal heart sounds.  Pulmonary:     Effort: Pulmonary effort is normal.     Breath sounds: Normal breath sounds.  Abdominal:     General: Bowel sounds are normal.     Palpations: Abdomen is soft.  Musculoskeletal:     Cervical back: Normal range of motion.  Lymphadenopathy:     Cervical: No cervical adenopathy.  Skin:    General: Skin is warm and dry.     Capillary Refill: Capillary refill takes less than 2 seconds.  Neurological:     Mental Status: She is alert and oriented to person, place, and time.  Psychiatric:        Behavior: Behavior normal.     Musculoskeletal Exam: Cervical spine was in good range of motion.  Shoulder joints, elbow joints, wrist joints, MCPs PIPs and DIPs with good range of motion with no synovitis.  Hip joints, knee joints, ankles, MTPs and PIPs with good range of motion with no synovitis.  CDAI Exam: CDAI Score: 0.2  Patient Global: 1 mm; Provider Global: 1 mm Swollen: 0 ; Tender: 0  Joint Exam 11/19/2021   No joint exam  has been documented for this visit   There is currently no information documented on the homunculus. Go to the Rheumatology activity and complete the homunculus joint exam.  Investigation: No additional findings.  Imaging: No results found.  Recent Labs: Lab Results  Component Value Date   WBC 6.3 10/09/2021   HGB 14.2 10/09/2021   PLT 247 10/09/2021   NA 139 10/09/2021   K 4.3 10/09/2021   CL 104 10/09/2021   CO2 31 10/09/2021   GLUCOSE 89 10/09/2021   BUN 15 10/09/2021   CREATININE 0.99 10/09/2021   BILITOT 0.4 10/09/2021  ALKPHOS 87 12/10/2020   AST 28 10/09/2021   ALT 27 10/09/2021   PROT 7.0 10/09/2021   ALBUMIN 4.6 12/10/2020   CALCIUM 9.5 10/09/2021   GFRAA 79 03/11/2021   QFTBGOLDPLUS NEGATIVE 02/04/2021    Speciality Comments: MTX-evaded creatinine, leflunomide-nausea and abdominal discomfort, Humira - started September 2022  Procedures:  No procedures performed Allergies: Arava [leflunomide], Hydrocodone-acetaminophen, and Tramadol   Assessment / Plan:     Visit Diagnoses: Seronegative rheumatoid arthritis ([place]) - H/o recurrent inflammatory arthritis since February 2022, Positive synovitis, RF-, anti-CCP negative, 14-3-3 eta negative, h/o of episcleritis: Patient had no synovitis on examination.  She has been off Humira for a week have as she is planning to undergo right partial knee replacement.  She was advised to hold Humira for 2 weeks and restart 2 weeks later if there is no infection and she gets clearance from Dr. Lorre Nick.  High risk medication use - Humira 40 mg sq injections once every 14 days-initiated 06/24/21. (previously on Arava-nausea and abdominal discomfort, Discontinued methotrexate due to elevated LFTs and elevated creatinine). - Plan: QuantiFERON-TB Gold Plus with next labs.  Labs from January were reviewed.  She will get labs in April and then every 3 months to monitor for drug toxicity.  Information on immunization was placed in the AVS.  She  was also advised to hold Humira in case she develops an infection and resume after the infection resolves.  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  She has not had any signs or symptoms of a flare since starting on Humira on 06/24/21.  Primary osteoarthritis of both hands-joint protection muscle strengthening was discussed.  Dupuytren's contracture of right hand - Mild, 3rd digit: Unchanged.  Patient states it is not bothersome.  Left knee pain-patient states that she started having left knee joint pain and discomfort recently.  She was seen by Dr. Lorre Nick who aspirated some of the fluid and obtained x-rays.  He advised left knee joint partial replacement.  She is scheduled to have knee joint surgery next week.  She has been holding Humira.  Primary osteoarthritis of both feet -she had no synovitis on examination today.  X-rays of both feet were consistent with osteoarthritis.   Idiopathic scoliosis of thoracolumbar region-she is off-and-on discomfort.  She does do stretching exercises.  Nodule of lower lobe of lung - She underwent a thorough work-up by pulmonary.   History of osteoporosis - Treated with IV Reclast (started September 2021) by [facility] endocrinology.  Past treatment Fosamax, Actonel and Evista.  He is scheduled for IV Reclast on March 6.  Calcium rich diet and vitamin D supplement was discussed.  Regular exercise was advised.  Vitamin D deficiency-she was recently diagnosed with vitamin D deficiency.  She is taking vitamin D 50,000 units once a week.  Dyslipidemia  Depression with anxiety  Environmental and seasonal allergies  Orders: Orders Placed This Encounter  Procedures   QuantiFERON-TB Gold Plus   No orders of the defined types were placed in this encounter.    Follow-Up Instructions: Return for Rheumatoid arthritis, Osteoarthritis.   Bo Merino, MD  Note - This record has been created using Editor, commissioning.  Chart creation errors have been  sought, but may not always  have been located. Such creation errors do not reflect on  the standard of medical care.

## 2021-11-10 ENCOUNTER — Encounter: Payer: Self-pay | Admitting: Rheumatology

## 2021-11-16 ENCOUNTER — Other Ambulatory Visit (HOSPITAL_COMMUNITY): Payer: Self-pay

## 2021-11-17 ENCOUNTER — Other Ambulatory Visit (HOSPITAL_COMMUNITY): Payer: Self-pay

## 2021-11-18 ENCOUNTER — Encounter: Payer: Self-pay | Admitting: Family Medicine

## 2021-11-18 HISTORY — PX: MEDIAL PARTIAL KNEE REPLACEMENT: SHX5965

## 2021-11-18 MED ORDER — LORAZEPAM 2 MG PO TABS: 4.0000 mg | ORAL_TABLET | Freq: Every day | ORAL | 5 refills | Status: DC

## 2021-11-18 NOTE — Telephone Encounter (Signed)
I sent in refills dated today  ?

## 2021-11-19 ENCOUNTER — Encounter: Payer: Self-pay | Admitting: Rheumatology

## 2021-11-19 ENCOUNTER — Ambulatory Visit (INDEPENDENT_AMBULATORY_CARE_PROVIDER_SITE_OTHER): Payer: BLUE CROSS/BLUE SHIELD | Admitting: Rheumatology

## 2021-11-19 ENCOUNTER — Other Ambulatory Visit: Payer: Self-pay

## 2021-11-19 VITALS — BP 118/78 | HR 82 | Resp 15 | Ht 62.5 in | Wt 146.0 lb

## 2021-11-19 DIAGNOSIS — M25562 Pain in left knee: Secondary | ICD-10-CM

## 2021-11-19 DIAGNOSIS — M19071 Primary osteoarthritis, right ankle and foot: Secondary | ICD-10-CM

## 2021-11-19 DIAGNOSIS — H15103 Unspecified episcleritis, bilateral: Secondary | ICD-10-CM | POA: Diagnosis not present

## 2021-11-19 DIAGNOSIS — M19041 Primary osteoarthritis, right hand: Secondary | ICD-10-CM

## 2021-11-19 DIAGNOSIS — F418 Other specified anxiety disorders: Secondary | ICD-10-CM

## 2021-11-19 DIAGNOSIS — E785 Hyperlipidemia, unspecified: Secondary | ICD-10-CM

## 2021-11-19 DIAGNOSIS — M4125 Other idiopathic scoliosis, thoracolumbar region: Secondary | ICD-10-CM

## 2021-11-19 DIAGNOSIS — M720 Palmar fascial fibromatosis [Dupuytren]: Secondary | ICD-10-CM

## 2021-11-19 DIAGNOSIS — R911 Solitary pulmonary nodule: Secondary | ICD-10-CM

## 2021-11-19 DIAGNOSIS — G8929 Other chronic pain: Secondary | ICD-10-CM

## 2021-11-19 DIAGNOSIS — Z79899 Other long term (current) drug therapy: Secondary | ICD-10-CM | POA: Diagnosis not present

## 2021-11-19 DIAGNOSIS — M19042 Primary osteoarthritis, left hand: Secondary | ICD-10-CM

## 2021-11-19 DIAGNOSIS — R609 Edema, unspecified: Secondary | ICD-10-CM

## 2021-11-19 DIAGNOSIS — E559 Vitamin D deficiency, unspecified: Secondary | ICD-10-CM

## 2021-11-19 DIAGNOSIS — J3089 Other allergic rhinitis: Secondary | ICD-10-CM

## 2021-11-19 DIAGNOSIS — Z8739 Personal history of other diseases of the musculoskeletal system and connective tissue: Secondary | ICD-10-CM

## 2021-11-19 DIAGNOSIS — M0600 Rheumatoid arthritis without rheumatoid factor, unspecified site: Secondary | ICD-10-CM | POA: Diagnosis not present

## 2021-11-19 DIAGNOSIS — M19072 Primary osteoarthritis, left ankle and foot: Secondary | ICD-10-CM

## 2021-11-19 NOTE — Patient Instructions (Addendum)
Standing Labs ?We placed an order today for your standing lab work.  ? ?Please have your standing labs drawn in April and every 3 months ? ?If possible, please have your labs drawn 2 weeks prior to your appointment so that the provider can discuss your results at your appointment. ? ?Please note that you may see your imaging and lab results in [place] before we have reviewed them. ?We may be awaiting multiple results to interpret others before contacting you. ?Please allow our office up to 72 hours to thoroughly review all of the results before contacting the office for clarification of your results. ? ?We have open lab daily: ?Monday through Thursday from 1:30-4:30 PM and Friday from 1:30-4:00 PM ?at the office of Dr. Bo Merino, [facility].   ?Please be advised, all patients with office appointments requiring lab work will take precedent over walk-in lab work.  ?If possible, please come for your lab work on Monday and Friday afternoons, as you may experience shorter wait times. ?The office is located at [address] [place], [place], [place] [address] ?No appointment is necessary.   ?Labs are drawn by [facility]. Please bring your co-pay at the time of your lab draw.  You may receive a bill from [facility] for your lab work. ? ?Please note if you are on Hydroxychloroquine and and an order has been placed for a Hydroxychloroquine level, you will need to have it drawn 4 hours or more after your last dose. ? ?If you wish to have your labs drawn at another location, please call the office 24 hours in advance to send orders. ? ?If you have any questions regarding directions or hours of operation,  ?please call [phone].   ?As a reminder, please drink plenty of water prior to coming for your lab work. Thanks!  ? ?Vaccines ?You are taking a medication(s) that can suppress your immune system.  The following immunizations are recommended: ?Flu annually ?Covid-19  ?Td/Tdap (tetanus, diphtheria, pertussis)  every 10 years ?Pneumonia (Prevnar 15 then Pneumovax 23 at least 1 year apart.  Alternatively, can take Prevnar 20 without needing additional dose) ?Shingrix: 2 doses from 4 weeks to 6 months apart ? ?Please check with your PCP to make sure you are up to date.  ? ? ? ?If you have signs or symptoms of an infection or start antibiotics: ?First, call your PCP for workup of your infection. ?Hold your medication through the infection, until you complete your antibiotics, and until symptoms resolve if you take the following: ?Injectable medication (Actemra, Benlysta, Cimzia, Cosentyx, Enbrel, Humira, Kevzara, Orencia, Remicade, Simponi, [place], [place], [place]) ?Methotrexate ?Leflunomide Jolee Ewing) ?Mycophenolate (Cellcept) ?Roma Kayser, or Rinvoq  ?IF YOU HAVE A SURGERY SCHEDULED: ?Hold the medication dose that is due BEFORE your surgery if you take the following: ?Injectable medication (Actemra, Benlysta, Cimzia, Cosentyx, Enbrel, Humira, Kevzara, Orencia, Remicade, Rituxan, Simponi, [place], [place], [place]) ?Methotrexate ?Leflunomide Jolee Ewing) ?Mycophenolate (Cellcept) ?Hold 3 days before surgery: Dellie Catholic, [place] ?Hold 1 week before surgery: azathioprine (Imuran), mycophenolate (CellCept), methotrexate ?You do NOT have to hold: hydroxychloroquine (Plaquenil), leflunomide (Arava), sulfasalazine, or Otezla ? ?AFTER YOUR SURGERY, you will need clearance by your SURGEON to resume your medication and have no signs of infection. ?They may send [place] a note or call our clinic to provide clearance.  ?

## 2021-12-08 ENCOUNTER — Other Ambulatory Visit (HOSPITAL_COMMUNITY): Payer: Self-pay

## 2021-12-10 ENCOUNTER — Other Ambulatory Visit (HOSPITAL_COMMUNITY): Payer: Self-pay

## 2021-12-15 ENCOUNTER — Encounter: Payer: Self-pay | Admitting: Rheumatology

## 2021-12-17 ENCOUNTER — Other Ambulatory Visit (HOSPITAL_COMMUNITY): Payer: Self-pay

## 2021-12-18 ENCOUNTER — Encounter: Payer: Self-pay | Admitting: Family Medicine

## 2021-12-20 ENCOUNTER — Other Ambulatory Visit: Payer: Self-pay | Admitting: Family Medicine

## 2021-12-20 ENCOUNTER — Encounter: Payer: Self-pay | Admitting: Family Medicine

## 2021-12-21 MED ORDER — DIAZEPAM 10 MG PO TABS: ORAL_TABLET | ORAL | 5 refills | Status: DC

## 2021-12-21 NOTE — Telephone Encounter (Signed)
Done

## 2021-12-21 NOTE — Telephone Encounter (Signed)
Pt LOV was on 10/14/2021 ?Last refill was done on 04/10/2021 ?Please advise ?

## 2021-12-21 NOTE — Telephone Encounter (Signed)
Last refill-04/10/21- 60 tabs, 5 refills ?Last OV- 10/14/2021 ? ?No future OV scheduled. Can this patient receive a refill. ? ?

## 2021-12-29 ENCOUNTER — Telehealth: Payer: Self-pay | Admitting: *Deleted

## 2021-12-29 NOTE — Telephone Encounter (Signed)
Received clearance letter from [facility] for patient to restart her Humira.  ? ?Called patient and left message to advise patient she may restart her Humira. Patient advised to call the office if she has any questions or concerns.  ?

## 2022-01-07 ENCOUNTER — Encounter: Payer: Self-pay | Admitting: Rheumatology

## 2022-01-11 ENCOUNTER — Encounter: Payer: Self-pay | Admitting: Rheumatology

## 2022-01-12 ENCOUNTER — Other Ambulatory Visit (HOSPITAL_COMMUNITY): Payer: Self-pay

## 2022-01-12 ENCOUNTER — Other Ambulatory Visit: Payer: Self-pay | Admitting: *Deleted

## 2022-01-12 DIAGNOSIS — Z79899 Other long term (current) drug therapy: Secondary | ICD-10-CM

## 2022-01-12 DIAGNOSIS — M0600 Rheumatoid arthritis without rheumatoid factor, unspecified site: Secondary | ICD-10-CM

## 2022-01-13 ENCOUNTER — Other Ambulatory Visit (HOSPITAL_COMMUNITY): Payer: Self-pay

## 2022-01-14 ENCOUNTER — Other Ambulatory Visit (HOSPITAL_COMMUNITY): Payer: Self-pay

## 2022-01-14 NOTE — Progress Notes (Signed)
CBC and CMP normal

## 2022-01-15 LAB — COMPLETE METABOLIC PANEL WITH GFR
AG Ratio: 1.7 (calc) (ref 1.0–2.5)
ALT: 21 U/L (ref 6–29)
AST: 20 U/L (ref 10–35)
Albumin: 4.5 g/dL (ref 3.6–5.1)
Alkaline phosphatase (APISO): 93 U/L (ref 37–153)
BUN: 15 mg/dL (ref 7–25)
CO2: 29 mmol/L (ref 20–32)
Calcium: 9.9 mg/dL (ref 8.6–10.4)
Chloride: 103 mmol/L (ref 98–110)
Creat: 0.94 mg/dL (ref 0.50–1.03)
Globulin: 2.7 g/dL (calc) (ref 1.9–3.7)
Glucose, Bld: 97 mg/dL (ref 65–99)
Potassium: 4.2 mmol/L (ref 3.5–5.3)
Sodium: 140 mmol/L (ref 135–146)
Total Bilirubin: 0.3 mg/dL (ref 0.2–1.2)
Total Protein: 7.2 g/dL (ref 6.1–8.1)
eGFR: 70 mL/min/{1.73_m2} (ref >=60)

## 2022-01-15 LAB — CBC WITH DIFFERENTIAL/PLATELET
Absolute Monocytes: 486 cells/uL (ref 200–950)
Basophils Absolute: 38 cells/uL (ref 0–200)
Basophils Relative: 0.6 %
Eosinophils Absolute: 128 cells/uL (ref 15–500)
Eosinophils Relative: 2 %
HCT: 44.9 % (ref 35.0–45.0)
Hemoglobin: 14.9 g/dL (ref 11.7–15.5)
Lymphs Abs: 2016 cells/uL (ref 850–3900)
MCH: 30.2 pg (ref 27.0–33.0)
MCHC: 33.2 g/dL (ref 32.0–36.0)
MCV: 90.9 fL (ref 80.0–100.0)
MPV: 10.6 fL (ref 7.5–12.5)
Monocytes Relative: 7.6 %
Neutro Abs: 3731 cells/uL (ref 1500–7800)
Neutrophils Relative %: 58.3 %
Platelets: 320 10*3/uL (ref 140–400)
RBC: 4.94 10*6/uL (ref 3.80–5.10)
RDW: 12.1 % (ref 11.0–15.0)
Total Lymphocyte: 31.5 %
WBC: 6.4 10*3/uL (ref 3.8–10.8)

## 2022-01-15 LAB — QUANTIFERON-TB GOLD PLUS
Mitogen-NIL: 8.34 IU/mL
NIL: 0.01 IU/mL
QuantiFERON-TB Gold Plus: NEGATIVE
TB1-NIL: 0 IU/mL
TB2-NIL: 0 IU/mL

## 2022-01-17 NOTE — Progress Notes (Signed)
TB Gold is negative.

## 2022-01-27 ENCOUNTER — Other Ambulatory Visit (HOSPITAL_COMMUNITY): Payer: Self-pay

## 2022-02-04 ENCOUNTER — Encounter: Payer: Self-pay | Admitting: Rheumatology

## 2022-02-04 NOTE — Telephone Encounter (Signed)
I called patient, we have not ordered any new scans, patient advised Dr. Inis Sizer ordered last scan.

## 2022-02-08 ENCOUNTER — Inpatient Hospital Stay: Admission: RE | Admit: 2022-02-08 | Payer: BLUE CROSS/BLUE SHIELD | Source: Ambulatory Visit

## 2022-02-17 ENCOUNTER — Other Ambulatory Visit: Payer: BLUE CROSS/BLUE SHIELD

## 2022-02-22 ENCOUNTER — Other Ambulatory Visit (HOSPITAL_COMMUNITY): Payer: Self-pay

## 2022-02-22 ENCOUNTER — Other Ambulatory Visit: Payer: Self-pay | Admitting: Physician Assistant

## 2022-02-22 DIAGNOSIS — M0600 Rheumatoid arthritis without rheumatoid factor, unspecified site: Secondary | ICD-10-CM

## 2022-02-22 MED ORDER — HUMIRA (2 PEN) 40 MG/0.4ML ~~LOC~~ AJKT
40.0000 mg | AUTO-INJECTOR | SUBCUTANEOUS | 0 refills | Status: DC
  Filled 2022-02-22: qty 2, 28d supply, fill #0
  Filled 2022-03-22: qty 2, 28d supply, fill #1

## 2022-02-22 NOTE — Telephone Encounter (Signed)
Next Visit: 04/22/2022  Last Visit: 11/19/2021  Last Fill: 10/07/2021  DX: Seronegative rheumatoid arthritis   Current Dose per office note 11/19/2021: Humira 40 mg sq injections once every 14 days  Labs: 01/13/2022 CBC and CMP normal.  TB Gold: 01/13/2022 TB Gold is negative.  Okay to refill Humira?

## 2022-02-23 ENCOUNTER — Other Ambulatory Visit (HOSPITAL_COMMUNITY): Payer: Self-pay

## 2022-02-25 ENCOUNTER — Other Ambulatory Visit (HOSPITAL_COMMUNITY): Payer: Self-pay

## 2022-03-19 ENCOUNTER — Other Ambulatory Visit (HOSPITAL_COMMUNITY): Payer: Self-pay

## 2022-03-22 ENCOUNTER — Other Ambulatory Visit (HOSPITAL_COMMUNITY): Payer: Self-pay

## 2022-03-25 ENCOUNTER — Other Ambulatory Visit (HOSPITAL_COMMUNITY): Payer: Self-pay

## 2022-03-26 ENCOUNTER — Encounter: Payer: Self-pay | Admitting: Rheumatology

## 2022-04-06 ENCOUNTER — Encounter: Payer: Self-pay | Admitting: Rheumatology

## 2022-04-08 NOTE — Progress Notes (Deleted)
Office Visit Note  Patient: Sarah Ford             Date of Birth: 07-29-1961           MRN: [id number]             PCP: Nelwyn Salisbury, MD Referring: Nelwyn Salisbury, MD Visit Date: 04/22/2022 Occupation: @GUAROCC @  Subjective:  No chief complaint on file.   History of Present Illness: Sarah Ford is a 61 y.o. female ***   Activities of Daily Living:  Patient reports morning stiffness for *** {minute/hour:19697}.   Patient {ACTIONS;DENIES/REPORTS:21021675::"Denies"} nocturnal pain.  Difficulty dressing/grooming: {ACTIONS;DENIES/REPORTS:21021675::"Denies"} Difficulty climbing stairs: {ACTIONS;DENIES/REPORTS:21021675::"Denies"} Difficulty getting out of chair: {ACTIONS;DENIES/REPORTS:21021675::"Denies"} Difficulty using hands for taps, buttons, cutlery, and/or writing: {ACTIONS;DENIES/REPORTS:21021675::"Denies"}  No Rheumatology ROS completed.   PMFS History:  Patient Active Problem List   Diagnosis Date Noted   Seronegative rheumatoid arthritis (HCC) 06/26/2021   Pulmonary nodules 03/05/2021   Arthralgia 12/10/2020   Peripheral edema 12/10/2020   Chronic right-sided low back pain with right-sided sciatica 01/25/2020   Constipation 10/24/2019   Environmental and seasonal allergies 10/24/2019   Migraine variant 03/09/2010   APHASIA 03/09/2010   INSOMNIA, CHRONIC 05/29/2007   Depression with anxiety 05/29/2007   Osteoporosis 05/29/2007    Past Medical History:  Diagnosis Date   Allergy    Arthritis    Constipation    Depression    IBS (irritable bowel syndrome)    sees Dr. 07/29/2007    Insomnia    Osteoporosis    gets Reclast per [facility]    Routine gynecological examination    sees Dr. [facility]    Seronegative rheumatoid arthritis [facility])    sees Dr. [facility]    Family History  Problem Relation Age of Onset   Cancer Mother    Breast cancer Mother    Thyroid disease Mother    Hypertension Mother    High Cholesterol Mother    Myelodysplastic syndrome  Maternal Aunt    Heart failure Father    Kidney failure Father    Diabetes Father    Hypertension Father    High Cholesterol Father    Osteoporosis Sister    Alcohol abuse Sister    Mental illness Sister    Healthy Son    Allergies Daughter    Depression Daughter    Past Surgical History:  Procedure Laterality Date   COLONOSCOPY  08/28/2014   per Dr. 14/05/2014, clear, repeat in 10 yrs    DILATION AND CURETTAGE OF UTERUS     x2   FOOT SURGERY Right    bone spur   HERNIA REPAIR     umbilical   TEMPOROMANDIBULAR JOINT SURGERY  1993   tummy tuck     dr Kinnie Scales   Social History   Social History Narrative   Not on file   Immunization History  Administered Date(s) Administered   Influenza Inj Mdck Quad With Preservative 06/12/2019   Influenza Split 07/26/2011, 07/05/2012   Influenza Whole 07/05/2007   Influenza,inj,Quad PF,6+ Mos 07/26/2014, 05/30/2015, 06/21/2016, 07/10/2018   Influenza-Unspecified 06/20/2017, 07/04/2020, 06/15/2021   PFIZER(Purple Top)SARS-COV-2 Vaccination 11/30/2019, 12/22/2019, 07/17/2020, 02/09/2021   PNEUMOCOCCAL CONJUGATE-20 02/19/2021   Tdap 12/14/2012   Zoster Recombinat (Shingrix) 04/10/2018     Objective: Vital Signs: There were no vitals taken for this visit.   Physical Exam   Musculoskeletal Exam: ***  CDAI Exam: CDAI Score: -- Patient Global: --; Provider Global: -- Swollen: --; Tender: -- Joint Exam 04/22/2022   No  joint exam has been documented for this visit   There is currently no information documented on the homunculus. Go to the Rheumatology activity and complete the homunculus joint exam.  Investigation: No additional findings.  Imaging: No results found.  Recent Labs: Lab Results  Component Value Date   WBC 6.4 01/13/2022   HGB 14.9 01/13/2022   PLT 320 01/13/2022   NA 140 01/13/2022   K 4.2 01/13/2022   CL 103 01/13/2022   CO2 29 01/13/2022   GLUCOSE 97 01/13/2022   BUN 15 01/13/2022   CREATININE 0.94  01/13/2022   BILITOT 0.3 01/13/2022   ALKPHOS 87 12/10/2020   AST 20 01/13/2022   ALT 21 01/13/2022   PROT 7.2 01/13/2022   ALBUMIN 4.6 12/10/2020   CALCIUM 9.9 01/13/2022   GFRAA 79 03/11/2021   QFTBGOLDPLUS NEGATIVE 01/13/2022    Speciality Comments: MTX-evaded creatinine, leflunomide-nausea and abdominal discomfort, Humira - started September 2022  Procedures:  No procedures performed Allergies: Arava [leflunomide], Hydrocodone-acetaminophen, and Tramadol   Assessment / Plan:     Visit Diagnoses: No diagnosis found.  Orders: No orders of the defined types were placed in this encounter.  No orders of the defined types were placed in this encounter.   Face-to-face time spent with patient was *** minutes. Greater than 50% of time was spent in counseling and coordination of care.  Follow-Up Instructions: No follow-ups on file.   Ellen Henri, CMA  Note - This record has been created using Animal nutritionist.  Chart creation errors have been sought, but may not always  have been located. Such creation errors do not reflect on  the standard of medical care.

## 2022-04-19 ENCOUNTER — Other Ambulatory Visit (HOSPITAL_COMMUNITY): Payer: Self-pay

## 2022-04-19 ENCOUNTER — Other Ambulatory Visit: Payer: Self-pay | Admitting: Pharmacist

## 2022-04-19 DIAGNOSIS — M0600 Rheumatoid arthritis without rheumatoid factor, unspecified site: Secondary | ICD-10-CM

## 2022-04-19 MED ORDER — HUMIRA (2 PEN) 40 MG/0.4ML ~~LOC~~ AJKT
40.0000 mg | AUTO-INJECTOR | SUBCUTANEOUS | 0 refills | Status: DC
  Filled 2022-04-19 (×2): qty 2, 28d supply, fill #0

## 2022-04-19 NOTE — Telephone Encounter (Signed)
Rx for Humira 40mg  SQ every 14 days sent to [facility] for hospital-based cost pricing.   Total qty remaining is 65ml   Labs are due and patient is aware per MyChart message.    11m, PharmD, MPH, BCPS, CPP Clinical Pharmacist (Rheumatology and Pulmonology)

## 2022-04-22 ENCOUNTER — Ambulatory Visit: Payer: BLUE CROSS/BLUE SHIELD | Admitting: Physician Assistant

## 2022-04-22 ENCOUNTER — Other Ambulatory Visit (HOSPITAL_COMMUNITY): Payer: Self-pay

## 2022-04-22 DIAGNOSIS — E785 Hyperlipidemia, unspecified: Secondary | ICD-10-CM

## 2022-04-22 DIAGNOSIS — M19041 Primary osteoarthritis, right hand: Secondary | ICD-10-CM

## 2022-04-22 DIAGNOSIS — M25562 Pain in left knee: Secondary | ICD-10-CM

## 2022-04-22 DIAGNOSIS — H15103 Unspecified episcleritis, bilateral: Secondary | ICD-10-CM

## 2022-04-22 DIAGNOSIS — G8929 Other chronic pain: Secondary | ICD-10-CM

## 2022-04-22 DIAGNOSIS — M720 Palmar fascial fibromatosis [Dupuytren]: Secondary | ICD-10-CM

## 2022-04-22 DIAGNOSIS — E559 Vitamin D deficiency, unspecified: Secondary | ICD-10-CM

## 2022-04-22 DIAGNOSIS — M19071 Primary osteoarthritis, right ankle and foot: Secondary | ICD-10-CM

## 2022-04-22 DIAGNOSIS — Z8739 Personal history of other diseases of the musculoskeletal system and connective tissue: Secondary | ICD-10-CM

## 2022-04-22 DIAGNOSIS — R911 Solitary pulmonary nodule: Secondary | ICD-10-CM

## 2022-04-22 DIAGNOSIS — J3089 Other allergic rhinitis: Secondary | ICD-10-CM

## 2022-04-22 DIAGNOSIS — M0600 Rheumatoid arthritis without rheumatoid factor, unspecified site: Secondary | ICD-10-CM

## 2022-04-22 DIAGNOSIS — F418 Other specified anxiety disorders: Secondary | ICD-10-CM

## 2022-04-22 DIAGNOSIS — M4125 Other idiopathic scoliosis, thoracolumbar region: Secondary | ICD-10-CM

## 2022-04-22 DIAGNOSIS — Z79899 Other long term (current) drug therapy: Secondary | ICD-10-CM

## 2022-04-22 NOTE — Progress Notes (Unsigned)
Office Visit Note  Patient: Sarah Ford             Date of Birth: 1961/03/29           MRN: [id number]             PCP: Nelwyn Salisbury, MD Referring: Nelwyn Salisbury, MD Visit Date: 04/28/2022 Occupation: @GUAROCC @  Subjective:  Left ankle joint pain   History of Present Illness: Sarah Ford is a 61 y.o. female with history of seronegative rheumatoid arthritis.  She remains on Humira 40 mg sq injections once every 14 days-initiated 06/24/21.  She is tolerating Humira without any side effects or injection site reactions.  She denies any signs or symptoms of a rheumatoid arthritis flare recently.  She has not had any epi scleritis flares in over 1 year.  She had a left knee partial replacement performed by Dr. 08/24/21 on 11/26/21.  She has completed physical therapy and is no longer performing home exercises.  She has returned to her normal activities.  She states that she was recently on vacation at the beach and states that she has been having increased discomfort in the left ankle joint for the past 3 weeks.  She denies any injury or fall but states that she was walking on uneven terrain at the beach which likely exacerbated her symptoms.  She states that she has some increased swelling in her left ankle compared to her right ankle but is unsure if this is edema after her left partial knee replacement.  She has been able to bear full weight on the left ankle without difficulty.  She describes the pain as a throbbing sensation at times.  She states that she reached out to Dr. 01/26/22 but was not evaluated in the office at that time.  She was given a prescription for Celebrex which she took for 1 week but discontinued due to the concern for her kidneys and liver.  She did not notice any improvement while taking Celebrex.  She denies any other joint pain or joint swelling at this time. She denies any recent or recurrent infections.      Activities of Daily Living:  Patient reports morning stiffness for  10 minutes.   Patient Reports nocturnal pain.  Difficulty dressing/grooming: Denies Difficulty climbing stairs: Denies Difficulty getting out of chair: Denies Difficulty using hands for taps, buttons, cutlery, and/or writing: Denies  Review of Systems  Constitutional:  Positive for fatigue.  HENT:  Positive for mouth dryness. Negative for mouth sores.   Eyes:  Negative for dryness.  Respiratory:  Negative for shortness of breath.   Cardiovascular:  Negative for chest pain and palpitations.  Gastrointestinal:  Negative for blood in stool, constipation and diarrhea.  Endocrine: Negative for increased urination.  Genitourinary:  Negative for involuntary urination.  Musculoskeletal:  Positive for joint pain, joint pain, joint swelling, muscle weakness, morning stiffness and muscle tenderness. Negative for myalgias and myalgias.  Skin:  Negative for color change, rash, hair loss and sensitivity to sunlight.  Allergic/Immunologic: Negative for susceptible to infections.  Neurological:  Negative for dizziness and headaches.  Hematological:  Negative for swollen glands.  Psychiatric/Behavioral:  Positive for sleep disturbance. Negative for depressed mood. The patient is not nervous/anxious.     PMFS History:  Patient Active Problem List   Diagnosis Date Noted   Seronegative rheumatoid arthritis (HCC) 06/26/2021   Pulmonary nodules 03/05/2021   Arthralgia 12/10/2020   Peripheral edema 12/10/2020   Chronic right-sided low back  pain with right-sided sciatica 01/25/2020   Constipation 10/24/2019   Environmental and seasonal allergies 10/24/2019   Migraine variant 03/09/2010   APHASIA 03/09/2010   INSOMNIA, CHRONIC 05/29/2007   Depression with anxiety 05/29/2007   Osteoporosis 05/29/2007    Past Medical History:  Diagnosis Date   Allergy    Arthritis    Constipation    Depression    IBS (irritable bowel syndrome)    sees Dr. Kinnie ScalesMedoff    Insomnia    Osteoporosis    [facility]    Routine gynecological examination    sees Dr. Rana SnareLowe    Seronegative rheumatoid arthritis [facility](HCC)    sees Dr. Corliss Skainseveshwar    Family History  Problem Relation Age of Onset   Cancer Mother    Breast cancer Mother    Thyroid disease Mother    Hypertension Mother    High Cholesterol Mother    Myelodysplastic syndrome Maternal Aunt    Heart failure Father    Kidney failure Father    Diabetes Father    Hypertension Father    High Cholesterol Father    Osteoporosis Sister    Alcohol abuse Sister    Mental illness Sister    Healthy Son    Allergies Daughter    Depression Daughter    Past Surgical History:  Procedure Laterality Date   COLONOSCOPY  08/28/2014   per Dr. Kinnie ScalesMedoff, clear, repeat in 10 yrs    DILATION AND CURETTAGE OF UTERUS     x2   FOOT SURGERY Right    bone spur   HERNIA REPAIR     umbilical   MEDIAL PARTIAL KNEE REPLACEMENT Left    TEMPOROMANDIBULAR JOINT SURGERY  1993   tummy tuck     [place]   Social History   Social History Narrative   Not on file   Immunization History  Administered Date(s) Administered   Influenza Inj Mdck Quad With Preservative 06/12/2019   Influenza Split 07/26/2011, 07/05/2012   Influenza Whole 07/05/2007   Influenza,inj,Quad PF,6+ Mos 07/26/2014, 05/30/2015, 06/21/2016, 07/10/2018   Influenza-Unspecified 06/20/2017, 07/04/2020, 06/15/2021   PFIZER(Purple Top)SARS-COV-2 Vaccination 11/30/2019, 12/22/2019, 07/17/2020, 02/09/2021   PNEUMOCOCCAL CONJUGATE-20 02/19/2021   Tdap 12/14/2012   Zoster Recombinat (Shingrix) 04/10/2018     Objective: Vital Signs: BP 110/78 (BP Location: Right Arm, Patient Position: Sitting, Cuff Size: Normal)   Pulse 89   Resp 14   Ht 5' 2.5" (1.588 m)   Wt 151 lb (68.5 kg)   BMI 27.18 kg/m    Physical Exam Vitals and nursing note reviewed.  Constitutional:      Appearance: She is well-developed.  HENT:     Head: Normocephalic and atraumatic.  Eyes:      Conjunctiva/sclera: Conjunctivae normal.  Cardiovascular:     Rate and Rhythm: Normal rate and regular rhythm.     Heart sounds: Normal heart sounds.  Pulmonary:     Effort: Pulmonary effort is normal.     Breath sounds: Normal breath sounds.  Abdominal:     General: Bowel sounds are normal.     Palpations: Abdomen is soft.  Musculoskeletal:     Cervical back: Normal range of motion.  Skin:    General: Skin is warm and dry.     Capillary Refill: Capillary refill takes less than 2 seconds.  Neurological:     Mental Status: She is alert and oriented to person, place, and time.  Psychiatric:        Behavior:  Behavior normal.      Musculoskeletal Exam: C-spine has good range of motion with no discomfort.  No midline spinal tenderness or SI joint tenderness.  Shoulder joints, elbow joints, wrist joints, MCPs, PIPs, DIPs have good range of motion with no synovitis.  Complete fist formation bilaterally.  Hip joints have good range of motion with no groin pain.  Right knee has good range of motion with no warmth or effusion.  Left partial knee replacement has good extension with warmth but no effusion.  Ankle joints have good range of motion with no tenderness along the joint line.  No evidence of Achilles tendinitis or plantar fasciitis.  Some tenderness over the superolateral aspect of the left ankle joint. No tenderness over malleolus.  No calf tightness or tenderness. No ecchymosis or erythema noted. No tenderness or synovitis of MTP joints.  CDAI Exam: CDAI Score: 0.2  Patient Global: 1 mm; Provider Global: 1 mm Swollen: 0 ; Tender: 0  Joint Exam 04/28/2022   No joint exam has been documented for this visit   There is currently no information documented on the homunculus. Go to the Rheumatology activity and complete the homunculus joint exam.  Investigation: No additional findings.  Imaging: XR Ankle Complete Left  Result Date: 04/28/2022 No tibiotalar or subtalar joint space  narrowing was noted.  Inferior calcaneal spur was noted.  No cortical defects were noted. Impression: Unremarkable x-ray of the ankle joint.   Recent Labs: Lab Results  Component Value Date   WBC 6.4 01/13/2022   HGB 14.9 01/13/2022   PLT 320 01/13/2022   NA 140 01/13/2022   K 4.2 01/13/2022   CL 103 01/13/2022   CO2 29 01/13/2022   GLUCOSE 97 01/13/2022   BUN 15 01/13/2022   CREATININE 0.94 01/13/2022   BILITOT 0.3 01/13/2022   ALKPHOS 87 12/10/2020   AST 20 01/13/2022   ALT 21 01/13/2022   PROT 7.2 01/13/2022   ALBUMIN 4.6 12/10/2020   CALCIUM 9.9 01/13/2022   GFRAA 79 03/11/2021   QFTBGOLDPLUS NEGATIVE 01/13/2022    Speciality Comments: MTX-evaded creatinine, leflunomide-nausea and abdominal discomfort, Humira - started September 2022  Procedures:  No procedures performed Allergies: Arava [leflunomide], Hydrocodone-acetaminophen, and Tramadol    Assessment / Plan:     Visit Diagnoses: Seronegative rheumatoid arthritis (HCC) - H/o recurrent inflammatory arthritis since February 2022, Positive synovitis, RF-, anti-CCP negative, 14-3-3 eta negative, h/o of episcleritis: She has no synovitis on examination today.  She has not had any signs or symptoms of a rheumatoid arthritis flare.  No episcleritis flares.  Her morning stiffness has only been lasting about 10 minutes daily.  She has clinically been doing well on Humira 40 mg subcutaneous injections every 14 days.  She continues to tolerate Humira without any side effects or injection site reactions. She will remain on humira as prescribed.  She was advised to notify us if she develops any signs or symptoms of a flare.  She will follow up in 5 months or sooner if needed.  She has not had any recent or recurrent infections.- Plan: CBC with Differential/Platelet, COMPLETE METABOLIC PANEL WITH GFR  High risk medication use - Humira 40 mg sq injections once every 14 days-initiated 06/24/21. - Plan: CBC with Differential/Platelet,  COMPLETE METABOLIC PANEL WITH GFR CBC and CMP WNL on 01/13/22. She is due to update lab work.  Orders released.  Her next lab work will be due in November and every 3 months.  TB gold negative on 01/13/22.  She has not had any recent or recurrent infections.  Discussed the importance of holding humira if she develops signs or symptoms of an infection and to resume once the infection has completely cleared.  Discussed the importance of yearly skin examinations while on Humira due to the increased risk for skin cancer.  She is planning on seeing her dermatologist in January 2024.  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  She has not had any signs or symptoms of a flare. She will remain on humira as prescribed.   Primary osteoarthritis of both hands: She has PIP and DIP thickening consistent with osteoarthritis of both hands.  No tenderness or inflammation noted.  Complete fist formation bilaterally.  Discussed the importance of joint protection and muscle strengthening.  Dupuytren's contracture of right hand - Mild, 3rd digit: Unchanged.   Status post left partial knee replacement: Dr. Sherlean Foot 11/25/21. Doing well.  Completed PT and home exercises.  She has returned to normal activities.    Primary osteoarthritis of both feet: No tenderness or synovitis over MTP joints.   Pain in left ankle and joints of left foot -She presents today with left ankle pain which started about 3 weeks ago after walking on uneven terrain at the beach.  No injury or fall prior to the onset of symptoms.  She has not noticed any improvement in her symptoms with time.  Of note she had the left knee joint partially replaced by Dr. Sherlean Foot in March 2023.  She is no longer going to physical therapy or performing any home exercises since she has returned to normal activities.  She reached out to Dr. Valentina Gu to discuss the symptoms she is experiencing and a prescription for Celebrex was sent to the pharmacy.  She tried taking  Celebrex for 1 week but discontinued due to concern for possible elevation of LFTs or renal function. On examination she has good range of motion of the left ankle joint with no joint tenderness or synovitis.  No evidence of Achilles tendinitis or plantar fasciitis.  No ecchymosis or erythema noted.  No calf tightness or tenderness noted.  No tenderness or synovitis over MTP joints.  No tenderness over the base of the fifth metatarsal noted.  No tenderness over over the medial or lateral malleolus.  X-rays of the left ankle were obtained today for further evaluation.  X-rays were unremarkable and were discussed with the patient today in the office.   The patient was fully weightbearing in the clinic today.  She has some superior lateral tenderness consistent with possible tendinitis of the left ankle.  Different treatment options were discussed today including compression, ice, rest, elevation, and the use of Voltaren gel.  She was advised to notify us if her symptoms persist or worsen.  Plan: XR Ankle Complete Left  Other medical conditions are listed as follows:   Idiopathic scoliosis of thoracolumbar region  Nodule of lower lobe of lung  History of osteoporosis - Treated with IV Reclast (started September 2021) by [facility] endocrinology.  Past treatment Fosamax, Actonel and Evista.  DEXA updated on 11/22/2019: AP spine T-score -3.6.  Due to update DEXA.  Patient plans on reaching out to her PCP for a new order for an updated bone density.  Vitamin D deficiency  Depression with anxiety  Dyslipidemia  Environmental and seasonal allergies    Orders: Orders Placed This Encounter  Procedures   XR Ankle Complete Left   CBC with Differential/Platelet   COMPLETE METABOLIC PANEL  WITH GFR   No orders of the defined types were placed in this encounter.  Follow-Up Instructions: Return in 5 months (on 09/28/2022) for Rheumatoid arthritis.   Gearldine Bienenstock, PA-C  Note - This record has been created  using Dragon software.  Chart creation errors have been sought, but may not always  have been located. Such creation errors do not reflect on  the standard of medical care.

## 2022-04-28 ENCOUNTER — Ambulatory Visit (INDEPENDENT_AMBULATORY_CARE_PROVIDER_SITE_OTHER): Payer: BLUE CROSS/BLUE SHIELD

## 2022-04-28 ENCOUNTER — Encounter: Payer: Self-pay | Admitting: Physician Assistant

## 2022-04-28 ENCOUNTER — Ambulatory Visit: Payer: BLUE CROSS/BLUE SHIELD | Attending: Physician Assistant | Admitting: Physician Assistant

## 2022-04-28 VITALS — BP 110/78 | HR 89 | Resp 14 | Ht 62.5 in | Wt 151.0 lb

## 2022-04-28 DIAGNOSIS — M0600 Rheumatoid arthritis without rheumatoid factor, unspecified site: Secondary | ICD-10-CM

## 2022-04-28 DIAGNOSIS — M19042 Primary osteoarthritis, left hand: Secondary | ICD-10-CM

## 2022-04-28 DIAGNOSIS — Z79899 Other long term (current) drug therapy: Secondary | ICD-10-CM | POA: Diagnosis not present

## 2022-04-28 DIAGNOSIS — M25572 Pain in left ankle and joints of left foot: Secondary | ICD-10-CM | POA: Diagnosis not present

## 2022-04-28 DIAGNOSIS — M4125 Other idiopathic scoliosis, thoracolumbar region: Secondary | ICD-10-CM

## 2022-04-28 DIAGNOSIS — H15103 Unspecified episcleritis, bilateral: Secondary | ICD-10-CM | POA: Diagnosis not present

## 2022-04-28 DIAGNOSIS — R911 Solitary pulmonary nodule: Secondary | ICD-10-CM

## 2022-04-28 DIAGNOSIS — M19072 Primary osteoarthritis, left ankle and foot: Secondary | ICD-10-CM

## 2022-04-28 DIAGNOSIS — Z8739 Personal history of other diseases of the musculoskeletal system and connective tissue: Secondary | ICD-10-CM

## 2022-04-28 DIAGNOSIS — F418 Other specified anxiety disorders: Secondary | ICD-10-CM

## 2022-04-28 DIAGNOSIS — M19041 Primary osteoarthritis, right hand: Secondary | ICD-10-CM

## 2022-04-28 DIAGNOSIS — G8929 Other chronic pain: Secondary | ICD-10-CM

## 2022-04-28 DIAGNOSIS — M25562 Pain in left knee: Secondary | ICD-10-CM

## 2022-04-28 DIAGNOSIS — J3089 Other allergic rhinitis: Secondary | ICD-10-CM

## 2022-04-28 DIAGNOSIS — E785 Hyperlipidemia, unspecified: Secondary | ICD-10-CM

## 2022-04-28 DIAGNOSIS — M19071 Primary osteoarthritis, right ankle and foot: Secondary | ICD-10-CM

## 2022-04-28 DIAGNOSIS — M720 Palmar fascial fibromatosis [Dupuytren]: Secondary | ICD-10-CM

## 2022-04-28 DIAGNOSIS — Z96652 Presence of left artificial knee joint: Secondary | ICD-10-CM

## 2022-04-28 DIAGNOSIS — E559 Vitamin D deficiency, unspecified: Secondary | ICD-10-CM

## 2022-04-29 ENCOUNTER — Telehealth: Payer: Self-pay | Admitting: *Deleted

## 2022-04-29 DIAGNOSIS — Z79899 Other long term (current) drug therapy: Secondary | ICD-10-CM

## 2022-04-29 LAB — COMPLETE METABOLIC PANEL WITHOUT GFR
AG Ratio: 1.8 (calc) (ref 1.0–2.5)
ALT: 29 U/L (ref 6–29)
AST: 28 U/L (ref 10–35)
Albumin: 4.6 g/dL (ref 3.6–5.1)
Alkaline phosphatase (APISO): 81 U/L (ref 37–153)
BUN/Creatinine Ratio: 14 (calc) (ref 6–22)
BUN: 15 mg/dL (ref 7–25)
CO2: 25 mmol/L (ref 20–32)
Calcium: 9.9 mg/dL (ref 8.6–10.4)
Chloride: 102 mmol/L (ref 98–110)
Creat: 1.1 mg/dL — ABNORMAL HIGH (ref 0.50–1.03)
Globulin: 2.6 g/dL (ref 1.9–3.7)
Glucose, Bld: 109 mg/dL — ABNORMAL HIGH (ref 65–99)
Potassium: 4.2 mmol/L (ref 3.5–5.3)
Sodium: 138 mmol/L (ref 135–146)
Total Bilirubin: 0.6 mg/dL (ref 0.2–1.2)
Total Protein: 7.2 g/dL (ref 6.1–8.1)
eGFR: 58 mL/min/1.73m2 — ABNORMAL LOW

## 2022-04-29 LAB — CBC WITH DIFFERENTIAL/PLATELET
Absolute Monocytes: 705 cells/uL (ref 200–950)
Basophils Absolute: 60 cells/uL (ref 0–200)
Basophils Relative: 0.8 %
Eosinophils Absolute: 248 cells/uL (ref 15–500)
Eosinophils Relative: 3.3 %
HCT: 43.8 % (ref 35.0–45.0)
Hemoglobin: 14.9 g/dL (ref 11.7–15.5)
Lymphs Abs: 2333 cells/uL (ref 850–3900)
MCH: 30.7 pg (ref 27.0–33.0)
MCHC: 34 g/dL (ref 32.0–36.0)
MCV: 90.1 fL (ref 80.0–100.0)
MPV: 10.6 fL (ref 7.5–12.5)
Monocytes Relative: 9.4 %
Neutro Abs: 4155 cells/uL (ref 1500–7800)
Neutrophils Relative %: 55.4 %
Platelets: 289 10*3/uL (ref 140–400)
RBC: 4.86 10*6/uL (ref 3.80–5.10)
RDW: 12.7 % (ref 11.0–15.0)
Total Lymphocyte: 31.1 %
WBC: 7.5 10*3/uL (ref 3.8–10.8)

## 2022-04-29 NOTE — Progress Notes (Signed)
CBC WNL.  Glucose is 109.  Creatinine is elevated-1.10 and GFR is low-58.  She should avoid the use of celebrex and other NSAIDs. Recheck BMP in 1 month.

## 2022-04-29 NOTE — Telephone Encounter (Signed)
-----   Message from Gearldine Bienenstock, PA-C sent at 04/29/2022  8:06 AM EDT ----- CBC WNL.  Glucose is 109.  Creatinine is elevated-1.10 and GFR is low-58.  She should avoid the use of celebrex and other NSAIDs. Recheck BMP in 1 month.

## 2022-05-13 ENCOUNTER — Other Ambulatory Visit (HOSPITAL_COMMUNITY): Payer: Self-pay

## 2022-05-13 ENCOUNTER — Other Ambulatory Visit: Payer: Self-pay | Admitting: Physician Assistant

## 2022-05-13 DIAGNOSIS — M0600 Rheumatoid arthritis without rheumatoid factor, unspecified site: Secondary | ICD-10-CM

## 2022-05-13 MED ORDER — HUMIRA (2 PEN) 40 MG/0.4ML ~~LOC~~ AJKT
40.0000 mg | AUTO-INJECTOR | SUBCUTANEOUS | 0 refills | Status: DC
  Filled 2022-05-13 (×2): qty 6, 84d supply, fill #0
  Filled 2022-05-17: qty 2, 28d supply, fill #0
  Filled 2022-06-15: qty 2, 28d supply, fill #1
  Filled 2022-07-09: qty 2, 28d supply, fill #2

## 2022-05-13 NOTE — Telephone Encounter (Signed)
Next Visit: 10/05/2022  Last Visit: 04/28/2022  Last Fill: 04/19/2022 (30 day supply)  [id number] rheumatoid arthritis   Current Dose per office note 04/28/2022: Humira 40 mg sq injections once every 14 days  Labs: 04/28/2022 CBC WNL.  Glucose is 109.  Creatinine is elevated-1.10 and GFR is low-58.   TB Gold: 01/13/2022 Neg    Okay to refill Humira?

## 2022-05-14 ENCOUNTER — Other Ambulatory Visit (HOSPITAL_COMMUNITY): Payer: Self-pay

## 2022-05-17 ENCOUNTER — Other Ambulatory Visit (HOSPITAL_COMMUNITY): Payer: Self-pay

## 2022-05-20 ENCOUNTER — Other Ambulatory Visit (HOSPITAL_COMMUNITY): Payer: Self-pay

## 2022-06-04 ENCOUNTER — Other Ambulatory Visit: Payer: Self-pay | Admitting: Rheumatology

## 2022-06-04 ENCOUNTER — Ambulatory Visit: Payer: BLUE CROSS/BLUE SHIELD

## 2022-06-04 ENCOUNTER — Other Ambulatory Visit: Payer: Self-pay | Admitting: *Deleted

## 2022-06-04 ENCOUNTER — Telehealth: Payer: Self-pay | Admitting: Pharmacist

## 2022-06-04 DIAGNOSIS — Z79899 Other long term (current) drug therapy: Secondary | ICD-10-CM

## 2022-06-04 NOTE — Telephone Encounter (Signed)
Patient due prior authorization renewal for Humira. Submitted a Prior Authorization request to [facility] for HUMIRA via CoverMyMeds. Will update once we receive a response.  Key: [id number]  Once approved, will need to update Nestor Ramp, PharmD, MPH, BCPS, CPP Clinical Pharmacist (Rheumatology and Pulmonology)

## 2022-06-05 LAB — CBC WITH DIFFERENTIAL/PLATELET
Absolute Monocytes: 519 cells/uL (ref 200–950)
Basophils Absolute: 59 cells/uL (ref 0–200)
Basophils Relative: 1 %
Eosinophils Absolute: 148 cells/uL (ref 15–500)
Eosinophils Relative: 2.5 %
HCT: 41.3 % (ref 35.0–45.0)
Hemoglobin: 14.2 g/dL (ref 11.7–15.5)
Lymphs Abs: 1841 cells/uL (ref 850–3900)
MCH: 30.9 pg (ref 27.0–33.0)
MCHC: 34.4 g/dL (ref 32.0–36.0)
MCV: 90 fL (ref 80.0–100.0)
MPV: 11.1 fL (ref 7.5–12.5)
Monocytes Relative: 8.8 %
Neutro Abs: 3334 cells/uL (ref 1500–7800)
Neutrophils Relative %: 56.5 %
Platelets: 262 10*3/uL (ref 140–400)
RBC: 4.59 10*6/uL (ref 3.80–5.10)
RDW: 12.5 % (ref 11.0–15.0)
Total Lymphocyte: 31.2 %
WBC: 5.9 10*3/uL (ref 3.8–10.8)

## 2022-06-05 LAB — COMPLETE METABOLIC PANEL WITH GFR
AG Ratio: 1.8 (calc) (ref 1.0–2.5)
ALT: 22 U/L (ref 6–29)
AST: 22 U/L (ref 10–35)
Albumin: 4.2 g/dL (ref 3.6–5.1)
Alkaline phosphatase (APISO): 70 U/L (ref 37–153)
BUN: 12 mg/dL (ref 7–25)
CO2: 29 mmol/L (ref 20–32)
Calcium: 9.6 mg/dL (ref 8.6–10.4)
Chloride: 104 mmol/L (ref 98–110)
Creat: 1.01 mg/dL (ref 0.50–1.03)
Globulin: 2.4 g/dL (calc) (ref 1.9–3.7)
Glucose, Bld: 100 mg/dL — ABNORMAL HIGH (ref 65–99)
Potassium: 4.3 mmol/L (ref 3.5–5.3)
Sodium: 139 mmol/L (ref 135–146)
Total Bilirubin: 0.4 mg/dL (ref 0.2–1.2)
Total Protein: 6.6 g/dL (ref 6.1–8.1)
eGFR: 65 mL/min/{1.73_m2} (ref >=60)

## 2022-06-07 ENCOUNTER — Ambulatory Visit: Payer: BLUE CROSS/BLUE SHIELD

## 2022-06-07 NOTE — Telephone Encounter (Signed)
Received notification from [facility] regarding a prior authorization for [place]. Authorization has been APPROVED from 06/04/2022 through 06/03/2023  Patient can continue to fill through [place]: [phone]   Authorization # [id number]  Knox Saliva, PharmD, MPH, BCPS, CPP Clinical Pharmacist (Rheumatology and Pulmonology)

## 2022-06-08 ENCOUNTER — Ambulatory Visit (INDEPENDENT_AMBULATORY_CARE_PROVIDER_SITE_OTHER): Payer: Self-pay

## 2022-06-08 DIAGNOSIS — Z23 Encounter for immunization: Secondary | ICD-10-CM

## 2022-06-10 ENCOUNTER — Other Ambulatory Visit (HOSPITAL_COMMUNITY): Payer: Self-pay

## 2022-06-15 ENCOUNTER — Other Ambulatory Visit (HOSPITAL_COMMUNITY): Payer: Self-pay

## 2022-06-17 ENCOUNTER — Other Ambulatory Visit (HOSPITAL_COMMUNITY): Payer: Self-pay

## 2022-06-19 ENCOUNTER — Other Ambulatory Visit: Payer: Self-pay | Admitting: Family Medicine

## 2022-06-19 ENCOUNTER — Encounter: Payer: Self-pay | Admitting: Family Medicine

## 2022-06-21 MED ORDER — LORAZEPAM 2 MG PO TABS
4.0000 mg | ORAL_TABLET | Freq: Every day | ORAL | 5 refills | Status: DC
Start: 2022-06-21 — End: 2022-12-14

## 2022-06-21 NOTE — Telephone Encounter (Signed)
Done

## 2022-06-28 NOTE — Telephone Encounter (Signed)
There is not one particular diet or supplement to eliminate fatigue.  I would remind further work-up by her PCP to determine the underlying cause for her fatigue.  I reviewed lab work from 06/04/2022 at which time she was not anemic.  She may require more extensive lab work to rule out vitamin deficiency.

## 2022-07-01 ENCOUNTER — Other Ambulatory Visit: Payer: Self-pay | Admitting: Obstetrics and Gynecology

## 2022-07-01 DIAGNOSIS — N6489 Other specified disorders of breast: Secondary | ICD-10-CM

## 2022-07-08 ENCOUNTER — Other Ambulatory Visit (HOSPITAL_COMMUNITY): Payer: Self-pay

## 2022-07-09 ENCOUNTER — Other Ambulatory Visit (HOSPITAL_COMMUNITY): Payer: Self-pay

## 2022-07-13 ENCOUNTER — Other Ambulatory Visit (HOSPITAL_COMMUNITY): Payer: Self-pay

## 2022-07-19 ENCOUNTER — Other Ambulatory Visit: Payer: Self-pay | Admitting: Obstetrics and Gynecology

## 2022-07-19 ENCOUNTER — Ambulatory Visit
Admission: RE | Admit: 2022-07-19 | Discharge: 2022-07-19 | Disposition: A | Discharge status: Home / Self Care | Payer: No Typology Code available for payment source | Source: Ambulatory Visit | Attending: Obstetrics and Gynecology | Admitting: Obstetrics and Gynecology

## 2022-07-19 ENCOUNTER — Ambulatory Visit
Admission: RE | Admit: 2022-07-19 | Discharge: 2022-07-19 | Disposition: A | Discharge status: Home / Self Care | Payer: Self-pay | Source: Ambulatory Visit | Attending: Obstetrics and Gynecology | Admitting: Obstetrics and Gynecology

## 2022-07-19 DIAGNOSIS — N6489 Other specified disorders of breast: Secondary | ICD-10-CM

## 2022-07-20 ENCOUNTER — Encounter: Payer: Self-pay | Admitting: Family Medicine

## 2022-07-22 ENCOUNTER — Encounter: Payer: Self-pay | Admitting: Family Medicine

## 2022-07-23 NOTE — Telephone Encounter (Signed)
As long as he feels okay, no need to worry now. If this keeps up for another week though, make an OV

## 2022-08-02 ENCOUNTER — Other Ambulatory Visit (HOSPITAL_COMMUNITY): Payer: Self-pay

## 2022-08-04 ENCOUNTER — Other Ambulatory Visit (HOSPITAL_COMMUNITY): Payer: Self-pay

## 2022-08-04 ENCOUNTER — Other Ambulatory Visit: Payer: Self-pay | Admitting: Physician Assistant

## 2022-08-04 DIAGNOSIS — M0600 Rheumatoid arthritis without rheumatoid factor, unspecified site: Secondary | ICD-10-CM

## 2022-08-04 MED ORDER — HUMIRA (2 PEN) 40 MG/0.4ML ~~LOC~~ AJKT
40.0000 mg | AUTO-INJECTOR | SUBCUTANEOUS | 0 refills | Status: DC
  Filled 2022-08-04: qty 2, 28d supply, fill #0
  Filled 2022-09-06: qty 2, 28d supply, fill #1
  Filled 2022-10-07 (×2): qty 2, 28d supply, fill #2

## 2022-08-04 NOTE — Telephone Encounter (Signed)
Next Visit: 10/05/2022  Last Visit: 04/28/2022  Last Fill: 05/13/2022  [id number] rheumatoid arthritis   Current Dose per office note 04/28/2022: Humira 40 mg sq injections once every 14 days   Labs: 06/04/2022 CBC and CMP WNL   TB Gold: 01/13/2022 WNL   Okay to refill Humira?

## 2022-08-06 ENCOUNTER — Other Ambulatory Visit: Payer: Self-pay | Admitting: *Deleted

## 2022-08-06 ENCOUNTER — Encounter: Payer: Self-pay | Admitting: Rheumatology

## 2022-08-06 DIAGNOSIS — Z79899 Other long term (current) drug therapy: Secondary | ICD-10-CM

## 2022-08-10 ENCOUNTER — Other Ambulatory Visit (HOSPITAL_COMMUNITY): Payer: Self-pay

## 2022-08-11 LAB — CBC WITH DIFFERENTIAL/PLATELET
Absolute Monocytes: 486 cells/uL (ref 200–950)
Basophils Absolute: 32 cells/uL (ref 0–200)
Basophils Relative: 0.5 %
Eosinophils Absolute: 160 cells/uL (ref 15–500)
Eosinophils Relative: 2.5 %
HCT: 42.3 % (ref 35.0–45.0)
Hemoglobin: 14.7 g/dL (ref 11.7–15.5)
Lymphs Abs: 2291 cells/uL (ref 850–3900)
MCH: 30.8 pg (ref 27.0–33.0)
MCHC: 34.8 g/dL (ref 32.0–36.0)
MCV: 88.7 fL (ref 80.0–100.0)
MPV: 11.1 fL (ref 7.5–12.5)
Monocytes Relative: 7.6 %
Neutro Abs: 3430 cells/uL (ref 1500–7800)
Neutrophils Relative %: 53.6 %
Platelets: 295 10*3/uL (ref 140–400)
RBC: 4.77 10*6/uL (ref 3.80–5.10)
RDW: 11.8 % (ref 11.0–15.0)
Total Lymphocyte: 35.8 %
WBC: 6.4 10*3/uL (ref 3.8–10.8)

## 2022-08-11 LAB — COMPLETE METABOLIC PANEL WITH GFR
AG Ratio: 1.6 (calc) (ref 1.0–2.5)
ALT: 23 U/L (ref 6–29)
AST: 25 U/L (ref 10–35)
Albumin: 4.4 g/dL (ref 3.6–5.1)
Alkaline phosphatase (APISO): 75 U/L (ref 37–153)
BUN: 13 mg/dL (ref 7–25)
CO2: 28 mmol/L (ref 20–32)
Calcium: 9.8 mg/dL (ref 8.6–10.4)
Chloride: 101 mmol/L (ref 98–110)
Creat: 1 mg/dL (ref 0.50–1.03)
Globulin: 2.7 g/dL (calc) (ref 1.9–3.7)
Glucose, Bld: 118 mg/dL — ABNORMAL HIGH (ref 65–99)
Potassium: 4.2 mmol/L (ref 3.5–5.3)
Sodium: 137 mmol/L (ref 135–146)
Total Bilirubin: 0.5 mg/dL (ref 0.2–1.2)
Total Protein: 7.1 g/dL (ref 6.1–8.1)
eGFR: 65 mL/min/{1.73_m2} (ref >=60)

## 2022-08-11 NOTE — Progress Notes (Signed)
CBC and CMP are normal.  Glucose is mildly elevated probably not a fasting sample.

## 2022-09-06 ENCOUNTER — Other Ambulatory Visit (HOSPITAL_COMMUNITY): Payer: Self-pay

## 2022-09-14 ENCOUNTER — Other Ambulatory Visit (HOSPITAL_COMMUNITY): Payer: Self-pay

## 2022-09-16 ENCOUNTER — Encounter: Payer: Self-pay | Admitting: Rheumatology

## 2022-09-24 ENCOUNTER — Encounter: Payer: Self-pay | Admitting: Family Medicine

## 2022-09-24 ENCOUNTER — Other Ambulatory Visit: Payer: Self-pay | Admitting: Family Medicine

## 2022-09-24 MED ORDER — DIAZEPAM 10 MG PO TABS: ORAL_TABLET | ORAL | 5 refills | Status: DC

## 2022-09-24 NOTE — Telephone Encounter (Signed)
Done

## 2022-09-24 NOTE — Telephone Encounter (Signed)
Pt LOV was on 10/14/21 Last refill was done on 12/22/21 Please advise

## 2022-10-05 ENCOUNTER — Other Ambulatory Visit (HOSPITAL_COMMUNITY): Payer: Self-pay

## 2022-10-05 ENCOUNTER — Ambulatory Visit: Payer: BLUE CROSS/BLUE SHIELD | Admitting: Rheumatology

## 2022-10-07 ENCOUNTER — Other Ambulatory Visit (HOSPITAL_COMMUNITY): Payer: Self-pay

## 2022-10-07 NOTE — Progress Notes (Signed)
Office Visit Note  Patient: Sarah Ford             Date of Birth: 1961/06/09           MRN: [id number]             PCP: Laurey Morale, MD Referring: Laurey Morale, MD Visit Date: 10/20/2022 Occupation: @GUAROCC @  Subjective:  Medication management  History of Present Illness: Karagan Lehr is a 62 y.o. female history of seronegative rheumatoid arthritis, osteoarthritis and osteoporosis.  She states she fell right after Christmas and landed on her both knees.  She developed bruising on her right knee joint with eventually healed.  She states after that fall her left knee joint which is partially replaced started feeling better.  She had chronic pain in her knee joint which resolved.  She has been taking Humira 40 mg subcu every other week without any interruption.  Her last dose of Humira was today.  She has not had any recurrence of episcleritis.  She continues to have some stiffness in her hands and her feet.  Been experiencing some discomfort over the right deltoid region.  She has not had recurrence of swelling in her left ankle or her left foot.  She has been going to [facility] endocrinology for IV Reclast.  She states she has had 2 courses of IV Reclast.  She is planning to get another IV Reclast this year.  She has been taking calcium and vitamin D.  She recently bought a treadmill and plans to exercise on a regular basis.    Activities of Daily Living:  Patient reports morning stiffness for 0  none .   Patient Denies nocturnal pain.  Difficulty dressing/grooming: Denies Difficulty climbing stairs: Denies Difficulty getting out of chair: Denies Difficulty using hands for taps, buttons, cutlery, and/or writing: Denies  Review of Systems  Constitutional:  Positive for fatigue.  HENT:  Negative for mouth sores and mouth dryness.   Eyes:  Negative for dryness.  Respiratory:  Negative for shortness of breath.   Cardiovascular:  Negative for chest pain and palpitations.   Gastrointestinal:  Positive for constipation. Negative for blood in stool and diarrhea.  Endocrine: Positive for increased urination.  Genitourinary:  Negative for involuntary urination.  Musculoskeletal:  Positive for joint pain and joint pain. Negative for gait problem, joint swelling, myalgias, muscle weakness, morning stiffness, muscle tenderness and myalgias.  Skin:  Negative for color change, rash, hair loss and sensitivity to sunlight.  Allergic/Immunologic: Negative for susceptible to infections.  Neurological:  Negative for dizziness and headaches.  Hematological:  Negative for swollen glands.  Psychiatric/Behavioral:  Negative for depressed mood and sleep disturbance. The patient is not nervous/anxious.     PMFS History:  Patient Active Problem List   Diagnosis Date Noted   Seronegative rheumatoid arthritis ([place]) 06/26/2021   Pulmonary nodules 03/05/2021   Arthralgia 12/10/2020   Peripheral edema 12/10/2020   Chronic right-sided low back pain with right-sided sciatica 01/25/2020   Constipation 10/24/2019   Environmental and seasonal allergies 10/24/2019   Migraine variant 03/09/2010   APHASIA 03/09/2010   INSOMNIA, CHRONIC 05/29/2007   Depression with anxiety 05/29/2007   Osteoporosis 05/29/2007    Past Medical History:  Diagnosis Date   Allergy    Arthritis    Constipation    Depression    IBS (irritable bowel syndrome)    sees Dr. Earlean Shawl    Insomnia    Osteoporosis    gets Reclast per [facility]  Routine gynecological examination    sees Dr. Rana Snare    Seronegative rheumatoid arthritis [facility])    sees Dr. Corliss Skains    Family History  Problem Relation Age of Onset   Cancer Mother    Breast cancer Mother    Thyroid disease Mother    Hypertension Mother    High Cholesterol Mother    Myelodysplastic syndrome Maternal Aunt    Heart failure Father    Kidney failure Father    Diabetes Father    Hypertension Father    High Cholesterol Father     Osteoporosis Sister    Alcohol abuse Sister    Mental illness Sister    Healthy Son    Allergies Daughter    Depression Daughter    Past Surgical History:  Procedure Laterality Date   COLONOSCOPY  08/28/2014   per Dr. Kinnie Scales, clear, repeat in 10 yrs    DILATION AND CURETTAGE OF UTERUS     x2   FOOT SURGERY Right    bone spur   HERNIA REPAIR     umbilical   MEDIAL PARTIAL KNEE REPLACEMENT Left    TEMPOROMANDIBULAR JOINT SURGERY  1993   tummy tuck     dr Shon Hough   Social History   Social History Narrative   Not on file   Immunization History  Administered Date(s) Administered   Influenza Inj Mdck Quad With Preservative 06/12/2019   Influenza Split 07/26/2011, 07/05/2012   Influenza Whole 07/05/2007   Influenza,inj,Quad PF,6+ Mos 07/26/2014, 05/30/2015, 06/21/2016, 07/10/2018, 06/08/2022   Influenza-Unspecified 06/20/2017, 07/04/2020, 06/15/2021   PFIZER(Purple Top)SARS-COV-2 Vaccination 11/30/2019, 12/22/2019, 07/17/2020, 02/09/2021   PNEUMOCOCCAL CONJUGATE-20 02/19/2021   Pfizer Covid-19 Vaccine Bivalent Booster 62yrs & up 07/19/2022   Tdap 12/14/2012   Zoster Recombinat (Shingrix) 04/10/2018     Objective: Vital Signs: BP 133/86 (BP Location: Left Arm, Patient Position: Sitting, Cuff Size: Normal)   Pulse 79   Resp 15   Ht 5' 2.5" (1.588 m)   Wt 153 lb (69.4 kg)   BMI 27.54 kg/m    Physical Exam Vitals and nursing note reviewed.  Constitutional:      Appearance: She is well-developed.  HENT:     Head: Normocephalic and atraumatic.  Eyes:     Conjunctiva/sclera: Conjunctivae normal.  Cardiovascular:     Rate and Rhythm: Normal rate and regular rhythm.     Heart sounds: Normal heart sounds.  Pulmonary:     Effort: Pulmonary effort is normal.     Breath sounds: Normal breath sounds.  Abdominal:     General: Bowel sounds are normal.     Palpations: Abdomen is soft.  Musculoskeletal:     Cervical back: Normal range of motion.  Lymphadenopathy:      Cervical: No cervical adenopathy.  Skin:    General: Skin is warm and dry.     Capillary Refill: Capillary refill takes less than 2 seconds.  Neurological:     Mental Status: She is alert and oriented to person, place, and time.  Psychiatric:        Behavior: Behavior normal.      Musculoskeletal Exam: Cervical spine was in good range of motion.  Shoulder joints, elbow joints, wrist joints, MCPs PIPs and DIPs Juengel range of motion.  She had tenderness over right subacromial region and the insertion of the right deltoid.  Hip joints and knee joints were in good range of motion.  She had left total knee replacement in the past.  There was no tenderness  over ankles or MTPs.  CDAI Exam: CDAI Score: -- Patient Global: 1 mm; Provider Global: 1 mm Swollen: --; Tender: -- Joint Exam 10/20/2022   No joint exam has been documented for this visit   There is currently no information documented on the homunculus. Go to the Rheumatology activity and complete the homunculus joint exam.  Investigation: No additional findings.  Imaging: No results found.  Recent Labs: Lab Results  Component Value Date   WBC 6.4 08/10/2022   HGB 14.7 08/10/2022   PLT 295 08/10/2022   NA 137 08/10/2022   K 4.2 08/10/2022   CL 101 08/10/2022   CO2 28 08/10/2022   GLUCOSE 118 (H) 08/10/2022   BUN 13 08/10/2022   CREATININE 1.00 08/10/2022   BILITOT 0.5 08/10/2022   ALKPHOS 87 12/10/2020   AST 25 08/10/2022   ALT 23 08/10/2022   PROT 7.1 08/10/2022   ALBUMIN 4.6 12/10/2020   CALCIUM 9.8 08/10/2022   GFRAA 79 03/11/2021   QFTBGOLDPLUS NEGATIVE 01/13/2022    Speciality Comments: MTX-evaded creatinine, leflunomide-nausea and abdominal discomfort, Humira - started September 2022  Procedures:  No procedures performed Allergies: Arava [leflunomide], Hydrocodone-acetaminophen, and Tramadol   Assessment / Plan:     Visit Diagnoses: Seronegative rheumatoid arthritis ([place]) - H/o recurrent inflammatory  arthritis since February 2022, Positive synovitis, RF-, anti-CCP negative, 14-3-3 eta negative, h/o of episcleritis: Patient had no synovitis on my examination.  She has been taking Humira 40 mg subcu every other week without any interruption.  She had last dose this morning.  She had no recurrence of episcleritis.  She denies any episodes of joint swelling.  She states recently she has been experiencing some discomfort in her right shoulder.  High risk medication use - Humira 40 mg sq injections once every 14 days -Labs from August 10, 2022 CBC and CMP were normal.  TB Gold was negative on January 13, 2022.  Will obtain labs today.  She was advised to get repeat labs in April along with TB Gold.  Information on immunization was placed in the AVS.  She was advised to hold them or if she develops an infection resume after the infection resolves.  She had a skin examination this year by the dermatologist.  Use of sunscreen was advised.  Plan: CBC with Differential/Platelet, COMPLETE METABOLIC PANEL WITH GFR, QuantiFERON-TB Gold Plus  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  She denies recurrence of episcleritis.  Acute pain of right shoulder-she has been experiencing some discomfort over the subacromial region and also at the deltoid insertion.  She states she cannot use Voltaren gel as she has a dog at home.  A handout on shoulder exercises was given.  I also advised to cortisone injection in the future if her symptoms persist.  Primary osteoarthritis of both hands-she has bilateral PIP and DIP thickening with no synovitis.  Joint protection was discussed.  Dupuytren's contracture of right hand - Mild, 3rd digit: Unchanged.  Status post left partial knee replacement - Dr. Ronnie Derby 11/25/21.  Patient states she was having chronic discomfort in her knee joint.  She had a fall in December after Christmas since then the pain in her left knee joint has resolved.  Primary osteoarthritis of both feet-she  denies any discomfort.  She had no tenderness.  Pain in left ankle and joints of left foot-no tenderness or swelling was noted.  Idiopathic scoliosis of thoracolumbar region-history of intermittent pain.  Nodule of lower lobe of lung  History  of osteoporosis - Treated with IV Reclast (started September 2021) by [facility].  Past treatment Fosamax, Actonel and Evista. DEXA updated on 11/22/2019: AP spine T-score -3.6.  Patient had IV Reclast in 2021, 2022.  She plans to get another Reclast infusion this year at [facility] endocrinology.  Vitamin D deficiency-she takes calcium and vitamin D.  Depression with anxiety  Dyslipidemia-increased risk of heart disease with autoimmune disease was discussed.  Dietary modifications and exercises were emphasized.  A handout on heart healthy diet was given.  Environmental and seasonal allergies  Orders: Orders Placed This Encounter  Procedures   CBC with Differential/Platelet   COMPLETE METABOLIC PANEL WITH GFR   QuantiFERON-TB Gold Plus   No orders of the defined types were placed in this encounter.    Follow-Up Instructions: Return in about 5 months (around 03/20/2023) for Rheumatoid arthritis.   Bo Merino, MD  Note - This record has been created using Editor, commissioning.  Chart creation errors have been sought, but may not always  have been located. Such creation errors do not reflect on  the standard of medical care.

## 2022-10-11 ENCOUNTER — Other Ambulatory Visit (HOSPITAL_COMMUNITY): Payer: Self-pay

## 2022-10-12 ENCOUNTER — Other Ambulatory Visit: Payer: Self-pay

## 2022-10-20 ENCOUNTER — Encounter: Payer: Self-pay | Admitting: Rheumatology

## 2022-10-20 ENCOUNTER — Ambulatory Visit: Payer: BLUE CROSS/BLUE SHIELD | Attending: Rheumatology | Admitting: Rheumatology

## 2022-10-20 VITALS — BP 133/86 | HR 79 | Resp 15 | Ht 62.5 in | Wt 153.0 lb

## 2022-10-20 DIAGNOSIS — M19071 Primary osteoarthritis, right ankle and foot: Secondary | ICD-10-CM

## 2022-10-20 DIAGNOSIS — F418 Other specified anxiety disorders: Secondary | ICD-10-CM

## 2022-10-20 DIAGNOSIS — M19042 Primary osteoarthritis, left hand: Secondary | ICD-10-CM

## 2022-10-20 DIAGNOSIS — Z79899 Other long term (current) drug therapy: Secondary | ICD-10-CM | POA: Diagnosis not present

## 2022-10-20 DIAGNOSIS — H15103 Unspecified episcleritis, bilateral: Secondary | ICD-10-CM

## 2022-10-20 DIAGNOSIS — M0600 Rheumatoid arthritis without rheumatoid factor, unspecified site: Secondary | ICD-10-CM | POA: Diagnosis not present

## 2022-10-20 DIAGNOSIS — M25511 Pain in right shoulder: Secondary | ICD-10-CM

## 2022-10-20 DIAGNOSIS — M19072 Primary osteoarthritis, left ankle and foot: Secondary | ICD-10-CM

## 2022-10-20 DIAGNOSIS — E559 Vitamin D deficiency, unspecified: Secondary | ICD-10-CM

## 2022-10-20 DIAGNOSIS — J3089 Other allergic rhinitis: Secondary | ICD-10-CM

## 2022-10-20 DIAGNOSIS — M19041 Primary osteoarthritis, right hand: Secondary | ICD-10-CM

## 2022-10-20 DIAGNOSIS — R911 Solitary pulmonary nodule: Secondary | ICD-10-CM

## 2022-10-20 DIAGNOSIS — M25572 Pain in left ankle and joints of left foot: Secondary | ICD-10-CM

## 2022-10-20 DIAGNOSIS — M720 Palmar fascial fibromatosis [Dupuytren]: Secondary | ICD-10-CM

## 2022-10-20 DIAGNOSIS — M4125 Other idiopathic scoliosis, thoracolumbar region: Secondary | ICD-10-CM

## 2022-10-20 DIAGNOSIS — E785 Hyperlipidemia, unspecified: Secondary | ICD-10-CM

## 2022-10-20 DIAGNOSIS — Z96652 Presence of left artificial knee joint: Secondary | ICD-10-CM

## 2022-10-20 DIAGNOSIS — Z8739 Personal history of other diseases of the musculoskeletal system and connective tissue: Secondary | ICD-10-CM

## 2022-10-20 NOTE — Patient Instructions (Addendum)
Standing Labs We placed an order today for your standing lab work.   Please have your standing labs drawn in April and every 3 months  TB Gold with next labs  Please have your labs drawn 2 weeks prior to your appointment so that the provider can discuss your lab results at your appointment.  Please note that you may see your imaging and lab results in [place] before we have reviewed them. We will contact you once all results are reviewed. Please allow our office up to 72 hours to thoroughly review all of the results before contacting the office for clarification of your results.  Lab hours are:   Monday through Thursday from 8:00 am -12:30 pm and 1:00 pm-5:00 pm and Friday from 8:00 am-12:00 pm.  Please be advised, all patients with office appointments requiring lab work will take precedent over walk-in lab work.   Labs are drawn by [facility]. Please bring your co-pay at the time of your lab draw.  You may receive a bill from [facility] for your lab work.  Please note if you are on Hydroxychloroquine and and an order has been placed for a Hydroxychloroquine level, you will need to have it drawn 4 hours or more after your last dose.  If you wish to have your labs drawn at another location, please call the office 24 hours in advance so we can fax the orders.  The office is located at [address], [place], [place], [place] [address] No appointment is necessary.    If you have any questions regarding directions or hours of operation,  please call [phone].   As a reminder, please drink plenty of water prior to coming for your lab work. Thanks!   Vaccines You are taking a medication(s) that can suppress your immune system.  The following immunizations are recommended: Flu annually Covid-19  RSV Td/Tdap (tetanus, diphtheria, pertussis) every 10 years Pneumonia (Prevnar 15 then Pneumovax 23 at least 1 year apart.  Alternatively, can take Prevnar 20 without needing additional  dose) Shingrix: 2 doses from 4 weeks to 6 months apart  Please check with your PCP to make sure you are up to date.   If you have signs or symptoms of an infection or start antibiotics: First, call your PCP for workup of your infection. Hold your medication through the infection, until you complete your antibiotics, and until symptoms resolve if you take the following: Injectable medication (Actemra, Benlysta, Cimzia, Cosentyx, Enbrel, Humira, Kevzara, Orencia, Remicade, Simponi, Stelara, Taltz, Tremfya) Methotrexate Leflunomide (Arava) Mycophenolate (Cellcept) Roma Kayser, or Rinvoq   Please get an annual skin exam yearly to screen for skin cancer while you are on Humira  Shoulder Exercises Ask your health care provider which exercises are safe for you. Do exercises exactly as told by your health care provider and adjust them as directed. It is normal to feel mild stretching, pulling, tightness, or discomfort as you do these exercises. Stop right away if you feel sudden pain or your pain gets worse. Do not begin these exercises until told by your health care provider. Stretching exercises External rotation and abduction This exercise is sometimes called corner stretch. The exercise rotates your arm outward (external rotation) and moves your arm out from your body (abduction). Stand in a doorway with one of your feet slightly in front of the other. This is called a staggered stance. If you cannot reach your forearms to the door frame, stand facing a corner of a room. Choose one of  the following positions as told by your health care provider: Place your hands and forearms on the door frame above your head. Place your hands and forearms on the door frame at the height of your head. Place your hands on the door frame at the height of your elbows. Slowly move your weight onto your front foot until you feel a stretch across your chest and in the front of your shoulders. Keep your head and  chest upright and keep your abdominal muscles tight. Hold for __________ seconds. To release the stretch, shift your weight to your back foot. Repeat __________ times. Complete this exercise __________ times a day. Extension, standing  Stand and hold a broomstick, a cane, or a similar object behind your back. Your hands should be a little wider than shoulder-width apart. Your palms should face away from your back. Keeping your elbows straight and your shoulder muscles relaxed, move the stick away from your body until you feel a stretch in your shoulders (extension). Avoid shrugging your shoulders while you move the stick. Keep your shoulder blades tucked down toward the middle of your back. Hold for __________ seconds. Slowly return to the starting position. Repeat __________ times. Complete this exercise __________ times a day. Range-of-motion exercises Pendulum  Stand near a wall or a surface that you can hold onto for balance. Bend at the waist and let your left / right arm hang straight down. Use your other arm to support you. Keep your back straight and do not lock your knees. Relax your left / right arm and shoulder muscles, and move your hips and your trunk so your left / right arm swings freely. Your arm should swing because of the motion of your body, not because you are using your arm or shoulder muscles. Keep moving your hips and trunk so your arm swings in the following directions, as told by your health care provider: Side to side. Forward and backward. In clockwise and counterclockwise circles. Continue each motion for __________ seconds, or for as long as told by your health care provider. Slowly return to the starting position. Repeat __________ times. Complete this exercise __________ times a day. Shoulder flexion, standing  Stand and hold a broomstick, a cane, or a similar object. Place your hands a little more than shoulder-width apart on the object. Your left / right  hand should be palm-up, and your other hand should be palm-down. Keep your elbow straight and your shoulder muscles relaxed. Push the stick up with your healthy arm to raise your left / right arm in front of your body, and then over your head until you feel a stretch in your shoulder (flexion). Avoid shrugging your shoulder while you raise your arm. Keep your shoulder blade tucked down toward the middle of your back. Hold for __________ seconds. Slowly return to the starting position. Repeat __________ times. Complete this exercise __________ times a day. Shoulder abduction, standing  Stand and hold a broomstick, a cane, or a similar object. Place your hands a little more than shoulder-width apart on the object. Your left / right hand should be palm-up, and your other hand should be palm-down. Keep your elbow straight and your shoulder muscles relaxed. Push the object across your body toward your left / right side. Raise your left / right arm to the side of your body (abduction) until you feel a stretch in your shoulder. Do not raise your arm above shoulder height unless your health care provider tells you to do that.  If directed, raise your arm over your head. Avoid shrugging your shoulder while you raise your arm. Keep your shoulder blade tucked down toward the middle of your back. Hold for __________ seconds. Slowly return to the starting position. Repeat __________ times. Complete this exercise __________ times a day. Internal rotation  Place your left / right hand behind your back, palm-up. Use your other hand to dangle an exercise band, a broomstick, or a similar object over your shoulder. Grasp the band with your left / right hand so you are holding on to both ends. Gently pull up on the band until you feel a stretch in the front of your left / right shoulder. The movement of your arm toward the center of your body is called internal rotation. Avoid shrugging your shoulder while you raise  your arm. Keep your shoulder blade tucked down toward the middle of your back. Hold for __________ seconds. Release the stretch by letting go of the band and lowering your hands. Repeat __________ times. Complete this exercise __________ times a day. Strengthening exercises External rotation  Sit in a stable chair without armrests. Secure an exercise band to a stable object at elbow height on your left / right side. Place a soft object, such as a folded towel or a small pillow, between your left / right upper arm and your body to move your elbow about 4 inches (10 cm) away from your side. Hold the end of the exercise band so it is tight and there is no slack. Keeping your elbow pressed against the soft object, slowly move your forearm out, away from your abdomen (external rotation). Keep your body steady so only your forearm moves. Hold for __________ seconds. Slowly return to the starting position. Repeat __________ times. Complete this exercise __________ times a day. Shoulder abduction  Sit in a stable chair without armrests, or stand up. Hold a __________ lb / kg weight in your left / right hand, or hold an exercise band with both hands. Start with your arms straight down and your left / right palm facing in, toward your body. Slowly lift your left / right hand out to your side (abduction). Do not lift your hand above shoulder height unless your health care provider tells you that this is safe. Keep your arms straight. Avoid shrugging your shoulder while you do this movement. Keep your shoulder blade tucked down toward the middle of your back. Hold for __________ seconds. Slowly lower your arm, and return to the starting position. Repeat __________ times. Complete this exercise __________ times a day. Shoulder extension  Sit in a stable chair without armrests, or stand up. Secure an exercise band to a stable object in front of you so it is at shoulder height. Hold one end of the  exercise band in each hand. Straighten your elbows and lift your hands up to shoulder height. Squeeze your shoulder blades together as you pull your hands down to the sides of your thighs (extension). Stop when your hands are straight down by your sides. Do not let your hands go behind your body. Hold for __________ seconds. Slowly return to the starting position. Repeat __________ times. Complete this exercise __________ times a day. Shoulder row  Sit in a stable chair without armrests, or stand up. Secure an exercise band to a stable object in front of you so it is at chest height. Hold one end of the exercise band in each hand. Position your palms so that your thumbs are  facing the ceiling (neutral position). Bend each of your elbows to a 90-degree angle (right angle) and keep your upper arms at your sides. Step back or move the chair back until the band is tight and there is no slack. Slowly pull your elbows back behind you. Hold for __________ seconds. Slowly return to the starting position. Repeat __________ times. Complete this exercise __________ times a day. Shoulder press-ups  Sit in a stable chair that has armrests. Sit upright, with your feet flat on the floor. Put your hands on the armrests so your elbows are bent and your fingers are pointing forward. Your hands should be about even with the sides of your body. Push down on the armrests and use your arms to lift yourself off the chair. Straighten your elbows and lift yourself up as much as you comfortably can. Move your shoulder blades down, and avoid letting your shoulders move up toward your ears. Keep your feet on the ground. As you get stronger, your feet should support less of your body weight as you lift yourself up. Hold for __________ seconds. Slowly lower yourself back into the chair. Repeat __________ times. Complete this exercise __________ times a day. Wall push-ups  Stand so you are facing a stable wall. Your  feet should be about one arm-length away from the wall. Lean forward and place your palms on the wall at shoulder height. Keep your feet flat on the floor as you bend your elbows and lean forward toward the wall. Hold for __________ seconds. Straighten your elbows to push yourself back to the starting position. Repeat __________ times. Complete this exercise __________ times a day. This information is not intended to replace advice given to you by your health care provider. Make sure you discuss any questions you have with your health care provider. Document Revised: 10/27/2021 Document Reviewed: 10/27/2021 Elsevier Patient Education  [employer].  Heart Disease Prevention   Your inflammatory disease increases your risk of heart disease which includes heart attack, stroke, atrial fibrillation (irregular heartbeats), high blood pressure, heart failure and atherosclerosis (plaque in the arteries).  It is important to reduce your risk by:   Keep blood pressure, cholesterol, and blood sugar at healthy levels   Smoking Cessation   Maintain a healthy weight  BMI 20-25   Eat a healthy diet  Plenty of fresh fruit, vegetables, and whole grains  Limit saturated fats, foods high in sodium, and added sugars  DASH and Mediterranean diet   Increase physical activity  Recommend moderate physically activity for 150 minutes per week/ 30 minutes a day for five days a week These can be broken up into three separate ten-minute sessions during the day.   Reduce Stress  Meditation, slow breathing exercises, yoga, coloring books  Dental visits twice a year

## 2022-10-21 LAB — COMPLETE METABOLIC PANEL WITH GFR
AG Ratio: 1.9 (calc) (ref 1.0–2.5)
ALT: 31 U/L — ABNORMAL HIGH (ref 6–29)
AST: 31 U/L (ref 10–35)
Albumin: 4.8 g/dL (ref 3.6–5.1)
Alkaline phosphatase (APISO): 91 U/L (ref 37–153)
BUN: 17 mg/dL (ref 7–25)
CO2: 26 mmol/L (ref 20–32)
Calcium: 9.7 mg/dL (ref 8.6–10.4)
Chloride: 102 mmol/L (ref 98–110)
Creat: 0.92 mg/dL (ref 0.50–1.03)
Globulin: 2.5 g/dL (calc) (ref 1.9–3.7)
Glucose, Bld: 84 mg/dL (ref 65–99)
Potassium: 4.3 mmol/L (ref 3.5–5.3)
Sodium: 138 mmol/L (ref 135–146)
Total Bilirubin: 0.5 mg/dL (ref 0.2–1.2)
Total Protein: 7.3 g/dL (ref 6.1–8.1)
eGFR: 72 mL/min/{1.73_m2} (ref >=60)

## 2022-10-21 LAB — CBC WITH DIFFERENTIAL/PLATELET
Absolute Monocytes: 722 cells/uL (ref 200–950)
Basophils Absolute: 38 cells/uL (ref 0–200)
Basophils Relative: 0.5 %
Eosinophils Absolute: 372 cells/uL (ref 15–500)
Eosinophils Relative: 4.9 %
HCT: 43.4 % (ref 35.0–45.0)
Hemoglobin: 15 g/dL (ref 11.7–15.5)
Lymphs Abs: 2219 cells/uL (ref 850–3900)
MCH: 30.5 pg (ref 27.0–33.0)
MCHC: 34.6 g/dL (ref 32.0–36.0)
MCV: 88.2 fL (ref 80.0–100.0)
MPV: 10.9 fL (ref 7.5–12.5)
Monocytes Relative: 9.5 %
Neutro Abs: 4248 cells/uL (ref 1500–7800)
Neutrophils Relative %: 55.9 %
Platelets: 265 10*3/uL (ref 140–400)
RBC: 4.92 10*6/uL (ref 3.80–5.10)
RDW: 12.2 % (ref 11.0–15.0)
Total Lymphocyte: 29.2 %
WBC: 7.6 10*3/uL (ref 3.8–10.8)

## 2022-10-21 NOTE — Progress Notes (Signed)
CBC and CMP normal except ALT is mildly elevated which could be due to statin use.  Patient should avoid all NSAIDs.  Please forward results to her PCP.

## 2022-11-09 ENCOUNTER — Other Ambulatory Visit (HOSPITAL_COMMUNITY): Payer: Self-pay

## 2022-11-09 ENCOUNTER — Other Ambulatory Visit: Payer: Self-pay | Admitting: Physician Assistant

## 2022-11-09 DIAGNOSIS — M0600 Rheumatoid arthritis without rheumatoid factor, unspecified site: Secondary | ICD-10-CM

## 2022-11-09 MED ORDER — HUMIRA (2 PEN) 40 MG/0.4ML ~~LOC~~ AJKT
40.0000 mg | AUTO-INJECTOR | SUBCUTANEOUS | 0 refills | Status: DC
  Filled 2022-11-09: qty 2, 28d supply, fill #0
  Filled 2022-12-08: qty 2, 28d supply, fill #1
  Filled 2023-01-11: qty 2, 28d supply, fill #2

## 2022-11-09 NOTE — Telephone Encounter (Signed)
Please schedule patient a follow up visit. Patient due June 2024. Thanks!

## 2022-11-09 NOTE — Telephone Encounter (Signed)
Next Visit: Due June 2024. Message sent to the front to schedule.   Last Visit: 10/20/2022  Last Fill: 08/04/2022  [id number] rheumatoid arthritis   Current Dose per office note 10/20/2022: Humira 40 mg sq injections once every 14 days   Labs: 10/20/2022 CBC and CMP normal except ALT is mildly elevated which could be due to statin use.   TB Gold: 01/13/2022 Neg    Okay to refill Humira?

## 2022-11-10 ENCOUNTER — Other Ambulatory Visit (HOSPITAL_COMMUNITY): Payer: Self-pay

## 2022-11-11 ENCOUNTER — Other Ambulatory Visit (HOSPITAL_COMMUNITY): Payer: Self-pay

## 2022-11-12 ENCOUNTER — Other Ambulatory Visit: Payer: Self-pay | Admitting: Family Medicine

## 2022-12-08 ENCOUNTER — Other Ambulatory Visit (HOSPITAL_COMMUNITY): Payer: Self-pay

## 2022-12-11 ENCOUNTER — Other Ambulatory Visit: Payer: Self-pay | Admitting: Family Medicine

## 2022-12-14 ENCOUNTER — Other Ambulatory Visit: Payer: Self-pay

## 2022-12-14 ENCOUNTER — Encounter: Payer: Self-pay | Admitting: Family Medicine

## 2022-12-14 MED ORDER — LORAZEPAM 2 MG PO TABS: 4.0000 mg | ORAL_TABLET | Freq: Every day | ORAL | 5 refills | Status: DC

## 2022-12-14 NOTE — Telephone Encounter (Signed)
Done

## 2022-12-15 ENCOUNTER — Telehealth: Payer: Self-pay | Admitting: *Deleted

## 2022-12-15 NOTE — Telephone Encounter (Signed)
Patient contacted the office and states she has taken a tumble down the stairs. Patient states she would like to know if she can come into the office for an x-ray to make sure everything is okay. Patient advised it is best if she schedules an evaluation with an orthopedist. Patient asked for a referral. Patient advised she should not need a referral as she has had an injury. Patient advised since we do not have documentation of the fall we would not be able to place a referral as we would not have any notes to send. Patient expressed understanding.

## 2022-12-27 ENCOUNTER — Encounter: Payer: BLUE CROSS/BLUE SHIELD | Admitting: Family Medicine

## 2023-01-10 ENCOUNTER — Encounter: Payer: Self-pay | Admitting: Family Medicine

## 2023-01-10 ENCOUNTER — Ambulatory Visit (INDEPENDENT_AMBULATORY_CARE_PROVIDER_SITE_OTHER): Payer: BLUE CROSS/BLUE SHIELD | Admitting: Family Medicine

## 2023-01-10 ENCOUNTER — Encounter: Payer: Self-pay | Admitting: Rheumatology

## 2023-01-10 VITALS — BP 120/76 | HR 64 | Temp 98.3°F | Ht 63.0 in | Wt 154.6 lb

## 2023-01-10 DIAGNOSIS — Z0000 Encounter for general adult medical examination without abnormal findings: Secondary | ICD-10-CM

## 2023-01-10 DIAGNOSIS — Z23 Encounter for immunization: Secondary | ICD-10-CM | POA: Diagnosis not present

## 2023-01-10 LAB — CBC WITH DIFFERENTIAL/PLATELET
Basophils Absolute: 0 10*3/uL (ref 0.0–0.1)
Basophils Relative: 0.7 % (ref 0.0–3.0)
Eosinophils Absolute: 0.2 10*3/uL (ref 0.0–0.7)
Eosinophils Relative: 3.4 % (ref 0.0–5.0)
HCT: 41.4 % (ref 36.0–46.0)
Hemoglobin: 14.3 g/dL (ref 12.0–15.0)
Lymphocytes Relative: 30.7 % (ref 12.0–46.0)
Lymphs Abs: 2.1 10*3/uL (ref 0.7–4.0)
MCHC: 34.4 g/dL (ref 30.0–36.0)
MCV: 89.2 fl (ref 78.0–100.0)
Monocytes Absolute: 0.6 10*3/uL (ref 0.1–1.0)
Monocytes Relative: 9.2 % (ref 3.0–12.0)
Neutro Abs: 3.8 10*3/uL (ref 1.4–7.7)
Neutrophils Relative %: 56 % (ref 43.0–77.0)
Platelets: 257 10*3/uL (ref 150.0–400.0)
RBC: 4.65 Mil/uL (ref 3.87–5.11)
RDW: 13.1 % (ref 11.5–15.5)
WBC: 6.7 10*3/uL (ref 4.0–10.5)

## 2023-01-10 LAB — BASIC METABOLIC PANEL
BUN: 15 mg/dL (ref 6–23)
CO2: 26 mEq/L (ref 19–32)
Calcium: 9.6 mg/dL (ref 8.4–10.5)
Chloride: 104 mEq/L (ref 96–112)
Creatinine, Ser: 1.08 mg/dL (ref 0.40–1.20)
GFR: 56.27 mL/min — ABNORMAL LOW (ref >60.00)
Glucose, Bld: 90 mg/dL (ref 70–99)
Potassium: 3.9 mEq/L (ref 3.5–5.1)
Sodium: 138 mEq/L (ref 135–145)

## 2023-01-10 LAB — HEPATIC FUNCTION PANEL
ALT: 31 U/L (ref 0–35)
AST: 36 U/L (ref 0–37)
Albumin: 4.4 g/dL (ref 3.5–5.2)
Alkaline Phosphatase: 82 U/L (ref 39–117)
Bilirubin, Direct: 0.1 mg/dL (ref 0.0–0.3)
Total Bilirubin: 0.5 mg/dL (ref 0.2–1.2)
Total Protein: 7.3 g/dL (ref 6.0–8.3)

## 2023-01-10 LAB — LIPID PANEL
Cholesterol: 174 mg/dL (ref 0–200)
HDL: 62.5 mg/dL (ref >39.00)
LDL Cholesterol: 89 mg/dL (ref 0–99)
NonHDL: 111.31
Total CHOL/HDL Ratio: 3
Triglycerides: 111 mg/dL (ref 0.0–149.0)
VLDL: 22.2 mg/dL (ref 0.0–40.0)

## 2023-01-10 LAB — TSH: TSH: 4.37 u[IU]/mL (ref 0.35–5.50)

## 2023-01-10 LAB — HEMOGLOBIN A1C: Hgb A1c MFr Bld: 5.8 % (ref 4.6–6.5)

## 2023-01-10 MED ORDER — METHOCARBAMOL 500 MG PO TABS: 500.0000 mg | ORAL_TABLET | Freq: Four times a day (QID) | ORAL | 2 refills | Status: DC | PRN

## 2023-01-10 MED ORDER — SUMATRIPTAN SUCCINATE 100 MG PO TABS: ORAL_TABLET | ORAL | 11 refills | Status: AC

## 2023-01-10 MED ORDER — ESCITALOPRAM OXALATE 20 MG PO TABS: 20.0000 mg | ORAL_TABLET | Freq: Two times a day (BID) | ORAL | 3 refills | Status: DC

## 2023-01-10 MED ORDER — ATORVASTATIN CALCIUM 20 MG PO TABS: 20.0000 mg | ORAL_TABLET | Freq: Every day | ORAL | 3 refills | Status: DC

## 2023-01-10 NOTE — Addendum Note (Signed)
Addended by: Carola Rhine on: 01/10/2023 09:53 AM   Modules accepted: Orders

## 2023-01-10 NOTE — Progress Notes (Signed)
Subjective:    Patient ID: Sarah Ford, female    DOB: 05-01-1961, 62 y.o.   MRN: [id number]  HPI Here for a well exam. She feels well in general. She is still a little sore from a fall down steps she had on 12-15-22. She bruised the center of her upper back. She went to an [facility] urgent care, and Xrays of the spine and ribs were normal. She was given a muscle relaxer, and she asks for a refill. She also mentions that her family frequently tells her that she has a hearing problem, but she disagrees.    Review of Systems  Constitutional: Negative.   HENT:  Positive for hearing loss.   Eyes: Negative.   Respiratory: Negative.    Cardiovascular: Negative.   Gastrointestinal: Negative.   Genitourinary:  Negative for decreased urine volume, difficulty urinating, dyspareunia, dysuria, enuresis, flank pain, frequency, hematuria, pelvic pain and urgency.  Musculoskeletal:  Positive for myalgias.  Skin: Negative.   Neurological: Negative.  Negative for headaches.  Psychiatric/Behavioral: Negative.         Objective:   Physical Exam Constitutional:      General: She is not in acute distress.    Appearance: Normal appearance. She is well-developed.  HENT:     Head: Normocephalic and atraumatic.     Right Ear: Tympanic membrane, ear canal and external ear normal.     Left Ear: Tympanic membrane, ear canal and external ear normal.     Nose: Nose normal.     Mouth/Throat:     Pharynx: No oropharyngeal exudate.  Eyes:     General: No scleral icterus.    Conjunctiva/sclera: Conjunctivae normal.     Pupils: Pupils are equal, round, and reactive to light.  Neck:     Thyroid: No thyromegaly.     Vascular: No JVD.  Cardiovascular:     Rate and Rhythm: Normal rate and regular rhythm.     Pulses: Normal pulses.     Heart sounds: Normal heart sounds. No murmur heard.    No friction rub. No gallop.  Pulmonary:     Effort: Pulmonary effort is normal. No respiratory distress.     Breath  sounds: Normal breath sounds. No wheezing or rales.  Chest:     Chest wall: No tenderness.  Abdominal:     General: Bowel sounds are normal. There is no distension.     Palpations: Abdomen is soft. There is no mass.     Tenderness: There is no abdominal tenderness. There is no guarding or rebound.  Musculoskeletal:        General: No tenderness. Normal range of motion.     Cervical back: Normal range of motion and neck supple.     Comments: The back has no ecchymosis or swelling. She is mildly tender between the scapulae. ROM is full   Lymphadenopathy:     Cervical: No cervical adenopathy.  Skin:    General: Skin is warm and dry.     Findings: No erythema or rash.  Neurological:     Mental Status: She is alert and oriented to person, place, and time.     Cranial Nerves: No cranial nerve deficit.     Motor: No abnormal muscle tone.     Coordination: Coordination normal.     Deep Tendon Reflexes: Reflexes are normal and symmetric. Reflexes normal.  Psychiatric:        Mood and Affect: Mood normal.        Behavior:  Behavior normal.        Thought Content: Thought content normal.        Judgment: Judgment normal.           Assessment & Plan:  Well exam. We discussed diet and exercise. Get fasting labs. Her back bruise is healing, and we wull give her some more Methocarbamol to use as needed. I advised her to see an audiologist for a hearing evaluation.  Gershon Crane, MD

## 2023-01-11 ENCOUNTER — Other Ambulatory Visit: Payer: Self-pay

## 2023-01-11 ENCOUNTER — Other Ambulatory Visit (HOSPITAL_COMMUNITY): Payer: Self-pay

## 2023-01-11 ENCOUNTER — Other Ambulatory Visit: Payer: Self-pay | Admitting: *Deleted

## 2023-01-11 DIAGNOSIS — Z79899 Other long term (current) drug therapy: Secondary | ICD-10-CM

## 2023-01-14 ENCOUNTER — Encounter: Payer: Self-pay | Admitting: Rheumatology

## 2023-01-14 LAB — CBC WITH DIFFERENTIAL/PLATELET
Eosinophils Absolute: 204 cells/uL (ref 15–500)
HCT: 41.4 % (ref 35.0–45.0)
Lymphs Abs: 2414 cells/uL (ref 850–3900)
MCHC: 34.1 g/dL (ref 32.0–36.0)
Neutro Abs: 3509 cells/uL (ref 1500–7800)
Total Lymphocyte: 35.5 %
WBC: 6.8 10*3/uL (ref 3.8–10.8)

## 2023-01-15 LAB — COMPLETE METABOLIC PANEL WITH GFR
ALT: 22 U/L (ref 6–29)
Albumin: 4.4 g/dL (ref 3.6–5.1)
Potassium: 4.1 mmol/L (ref 3.5–5.3)
Sodium: 139 mmol/L (ref 135–146)

## 2023-01-15 LAB — CBC WITH DIFFERENTIAL/PLATELET
Basophils Absolute: 41 cells/uL (ref 0–200)
MCV: 89 fL (ref 80.0–100.0)
Neutrophils Relative %: 51.6 %

## 2023-01-16 LAB — COMPLETE METABOLIC PANEL WITH GFR
AG Ratio: 1.9 (calc) (ref 1.0–2.5)
AST: 23 U/L (ref 10–35)
Alkaline phosphatase (APISO): 84 U/L (ref 37–153)
BUN: 15 mg/dL (ref 7–25)
CO2: 27 mmol/L (ref 20–32)
Calcium: 9.7 mg/dL (ref 8.6–10.4)
Chloride: 105 mmol/L (ref 98–110)
Creat: 1.01 mg/dL (ref 0.50–1.03)
Globulin: 2.3 g/dL (calc) (ref 1.9–3.7)
Glucose, Bld: 115 mg/dL — ABNORMAL HIGH (ref 65–99)
Total Bilirubin: 0.4 mg/dL (ref 0.2–1.2)
Total Protein: 6.7 g/dL (ref 6.1–8.1)
eGFR: 64 mL/min/{1.73_m2} (ref >=60)

## 2023-01-16 LAB — CBC WITH DIFFERENTIAL/PLATELET
Absolute Monocytes: 632 cells/uL (ref 200–950)
Basophils Relative: 0.6 %
Eosinophils Relative: 3 %
Hemoglobin: 14.1 g/dL (ref 11.7–15.5)
MCH: 30.3 pg (ref 27.0–33.0)
MPV: 11 fL (ref 7.5–12.5)
Monocytes Relative: 9.3 %
Platelets: 273 10*3/uL (ref 140–400)
RBC: 4.65 10*6/uL (ref 3.80–5.10)
RDW: 12.1 % (ref 11.0–15.0)

## 2023-01-16 LAB — QUANTIFERON-TB GOLD PLUS
Mitogen-NIL: >10 IU/mL
NIL: 0.03 IU/mL
QuantiFERON-TB Gold Plus: NEGATIVE
TB1-NIL: 0 IU/mL
TB2-NIL: 0 IU/mL

## 2023-01-16 NOTE — Progress Notes (Signed)
CBC is normal.  CMP is normal except mildly elevated glucose, probably not a fasting sample.  TB Gold is negative.

## 2023-01-17 NOTE — Telephone Encounter (Signed)
Called patient with lab results.  

## 2023-01-18 ENCOUNTER — Other Ambulatory Visit (HOSPITAL_COMMUNITY): Payer: Self-pay

## 2023-02-04 ENCOUNTER — Other Ambulatory Visit: Payer: Self-pay

## 2023-02-07 ENCOUNTER — Other Ambulatory Visit: Payer: Self-pay

## 2023-02-07 ENCOUNTER — Other Ambulatory Visit: Payer: Self-pay | Admitting: Physician Assistant

## 2023-02-07 DIAGNOSIS — M0600 Rheumatoid arthritis without rheumatoid factor, unspecified site: Secondary | ICD-10-CM

## 2023-02-07 MED ORDER — HUMIRA (2 PEN) 40 MG/0.4ML ~~LOC~~ AJKT
40.0000 mg | AUTO-INJECTOR | SUBCUTANEOUS | 0 refills | Status: DC
Start: 2023-02-07 — End: 2023-04-20
  Filled 2023-02-07: qty 2, 28d supply, fill #0
  Filled 2023-03-16: qty 2, 28d supply, fill #1
  Filled 2023-04-14: qty 2, 28d supply, fill #2

## 2023-02-07 NOTE — Telephone Encounter (Signed)
Last Fill: 11/09/2022  Labs: 01/14/2023 CBC is normal.  CMP is normal except mildly elevated glucose   TB Gold: 01/14/2023 Neg    Next Visit: 03/14/2023  Last Visit: 10/20/2022  DX: Seronegative rheumatoid arthritis   Current Dose per office note 10/20/2022: Humira 40 mg sq injections once every 14 days   Okay to refill Humira?

## 2023-02-11 ENCOUNTER — Other Ambulatory Visit (HOSPITAL_COMMUNITY): Payer: Self-pay

## 2023-02-28 NOTE — Progress Notes (Deleted)
Office Visit Note  Patient: Sarah Ford             Date of Birth: 09-Jun-1961           MRN: [id number]             PCP: Nelwyn Salisbury, MD Referring: Nelwyn Salisbury, MD Visit Date: 03/14/2023 Occupation: @GUAROCC @  Subjective:    History of Present Illness: Sarah Ford is a 62 y.o. female with history of seronegative rheumatoid arthritis and episcleritis.  Patient remains on Humira 40 mg sq injections once every 14 days   CBC and CMP updated on 01/14/23. Her next lab work will be due at the end of July and every 3 months.  TB gold negative on 01/14/23.  Discussed the importance of holding humira if she develops signs or symptoms of an infection and to resume once the infection has completely cleared.    Activities of Daily Living:  Patient reports morning stiffness for *** {minute/hour:19697}.   Patient {ACTIONS;DENIES/REPORTS:21021675::"Denies"} nocturnal pain.  Difficulty dressing/grooming: {ACTIONS;DENIES/REPORTS:21021675::"Denies"} Difficulty climbing stairs: {ACTIONS;DENIES/REPORTS:21021675::"Denies"} Difficulty getting out of chair: {ACTIONS;DENIES/REPORTS:21021675::"Denies"} Difficulty using hands for taps, buttons, cutlery, and/or writing: {ACTIONS;DENIES/REPORTS:21021675::"Denies"}  No Rheumatology ROS completed.   PMFS History:  Patient Active Problem List   Diagnosis Date Noted   Seronegative rheumatoid arthritis (HCC) 06/26/2021   Pulmonary nodules 03/05/2021   Arthralgia 12/10/2020   Peripheral edema 12/10/2020   Chronic right-sided low back pain with right-sided sciatica 01/25/2020   Constipation 10/24/2019   Environmental and seasonal allergies 10/24/2019   Migraine variant 03/09/2010   APHASIA 03/09/2010   INSOMNIA, CHRONIC 05/29/2007   Depression with anxiety 05/29/2007   Osteoporosis 05/29/2007    Past Medical History:  Diagnosis Date   Allergy    Arthritis    Constipation    Depression    IBS (irritable bowel syndrome)    sees Dr. Kinnie Scales     Insomnia    Osteoporosis    gets Reclast per [facility] Endocrinology    Routine gynecological examination    sees Dr. Rana Snare    Seronegative rheumatoid arthritis [facility])    sees Dr. Corliss Skains    Family History  Problem Relation Age of Onset   Cancer Mother    Breast cancer Mother    Thyroid disease Mother    Hypertension Mother    High Cholesterol Mother    Myelodysplastic syndrome Maternal Aunt    Heart failure Father    Kidney failure Father    Diabetes Father    Hypertension Father    High Cholesterol Father    Osteoporosis Sister    Alcohol abuse Sister    Mental illness Sister    Healthy Son    Allergies Daughter    Depression Daughter    Past Surgical History:  Procedure Laterality Date   COLONOSCOPY  08/28/2014   per Dr. Kinnie Scales, clear, repeat in 10 yrs    DILATION AND CURETTAGE OF UTERUS     x2   FOOT SURGERY Right    bone spur   HERNIA REPAIR     umbilical   MEDIAL PARTIAL KNEE REPLACEMENT Left    TEMPOROMANDIBULAR JOINT SURGERY  1993   tummy tuck     dr Shon Hough   Social History   Social History Narrative   Not on file   Immunization History  Administered Date(s) Administered   Influenza Inj Mdck Quad With Preservative 06/12/2019   Influenza Split 07/26/2011, 07/05/2012   Influenza Whole 07/05/2007   Influenza,inj,Quad PF,6+ Mos 07/26/2014,  05/30/2015, 06/21/2016, 07/10/2018, 06/08/2022   Influenza-Unspecified 06/20/2017, 07/04/2020, 06/15/2021   PFIZER(Purple Top)SARS-COV-2 Vaccination 11/30/2019, 12/22/2019, 07/17/2020, 02/09/2021   PNEUMOCOCCAL CONJUGATE-20 02/19/2021   Pfizer Covid-19 Vaccine Bivalent Booster 82yrs & up 07/19/2022   Tdap 12/14/2012, 01/10/2023   Zoster Recombinat (Shingrix) 04/10/2018, 10/20/2018     Objective: Vital Signs: There were no vitals taken for this visit.   Physical Exam Vitals and nursing note reviewed.  Constitutional:      Appearance: She is well-developed.  HENT:     Head: Normocephalic and atraumatic.  Eyes:      Conjunctiva/sclera: Conjunctivae normal.  Cardiovascular:     Rate and Rhythm: Normal rate and regular rhythm.     Heart sounds: Normal heart sounds.  Pulmonary:     Effort: Pulmonary effort is normal.     Breath sounds: Normal breath sounds.  Abdominal:     General: Bowel sounds are normal.     Palpations: Abdomen is soft.  Musculoskeletal:     Cervical back: Normal range of motion.  Lymphadenopathy:     Cervical: No cervical adenopathy.  Skin:    General: Skin is warm and dry.     Capillary Refill: Capillary refill takes less than 2 seconds.  Neurological:     Mental Status: She is alert and oriented to person, place, and time.  Psychiatric:        Behavior: Behavior normal.      Musculoskeletal Exam: ***  CDAI Exam: CDAI Score: -- Patient Global: --; Provider Global: -- Swollen: --; Tender: -- Joint Exam 03/14/2023   No joint exam has been documented for this visit   There is currently no information documented on the homunculus. Go to the Rheumatology activity and complete the homunculus joint exam.  Investigation: No additional findings.  Imaging: No results found.  Recent Labs: Lab Results  Component Value Date   WBC 6.8 01/14/2023   HGB 14.1 01/14/2023   PLT 273 01/14/2023   NA 139 01/14/2023   K 4.1 01/14/2023   CL 105 01/14/2023   CO2 27 01/14/2023   GLUCOSE 115 (H) 01/14/2023   BUN 15 01/14/2023   CREATININE 1.01 01/14/2023   BILITOT 0.4 01/14/2023   ALKPHOS 82 01/10/2023   AST 23 01/14/2023   ALT 22 01/14/2023   PROT 6.7 01/14/2023   ALBUMIN 4.4 01/10/2023   CALCIUM 9.7 01/14/2023   GFRAA 79 03/11/2021   QFTBGOLDPLUS NEGATIVE 01/14/2023    Speciality Comments: MTX-evaded creatinine, leflunomide-nausea and abdominal discomfort, Humira - started September 2022  Procedures:  No procedures performed Allergies: Arava [leflunomide], Hydrocodone-acetaminophen, and Tramadol   Assessment / Plan:     Visit Diagnoses: No diagnosis  found.  Orders: No orders of the defined types were placed in this encounter.  No orders of the defined types were placed in this encounter.   Face-to-face time spent with patient was *** minutes. Greater than 50% of time was spent in counseling and coordination of care.  Follow-Up Instructions: No follow-ups on file.   Ellen Henri, CMA  Note - This record has been created using Animal nutritionist.  Chart creation errors have been sought, but may not always  have been located. Such creation errors do not reflect on  the standard of medical care.

## 2023-03-01 ENCOUNTER — Encounter: Payer: Self-pay | Admitting: Rheumatology

## 2023-03-14 ENCOUNTER — Ambulatory Visit: Payer: BLUE CROSS/BLUE SHIELD | Admitting: Physician Assistant

## 2023-03-14 DIAGNOSIS — Z96652 Presence of left artificial knee joint: Secondary | ICD-10-CM

## 2023-03-14 DIAGNOSIS — M0600 Rheumatoid arthritis without rheumatoid factor, unspecified site: Secondary | ICD-10-CM

## 2023-03-14 DIAGNOSIS — J3089 Other allergic rhinitis: Secondary | ICD-10-CM

## 2023-03-14 DIAGNOSIS — F418 Other specified anxiety disorders: Secondary | ICD-10-CM

## 2023-03-14 DIAGNOSIS — M25511 Pain in right shoulder: Secondary | ICD-10-CM

## 2023-03-14 DIAGNOSIS — E785 Hyperlipidemia, unspecified: Secondary | ICD-10-CM

## 2023-03-14 DIAGNOSIS — Z79899 Other long term (current) drug therapy: Secondary | ICD-10-CM

## 2023-03-14 DIAGNOSIS — M25572 Pain in left ankle and joints of left foot: Secondary | ICD-10-CM

## 2023-03-14 DIAGNOSIS — M4125 Other idiopathic scoliosis, thoracolumbar region: Secondary | ICD-10-CM

## 2023-03-14 DIAGNOSIS — R911 Solitary pulmonary nodule: Secondary | ICD-10-CM

## 2023-03-14 DIAGNOSIS — E559 Vitamin D deficiency, unspecified: Secondary | ICD-10-CM

## 2023-03-14 DIAGNOSIS — Z8739 Personal history of other diseases of the musculoskeletal system and connective tissue: Secondary | ICD-10-CM

## 2023-03-14 DIAGNOSIS — M19072 Primary osteoarthritis, left ankle and foot: Secondary | ICD-10-CM

## 2023-03-14 DIAGNOSIS — M720 Palmar fascial fibromatosis [Dupuytren]: Secondary | ICD-10-CM

## 2023-03-14 DIAGNOSIS — M19041 Primary osteoarthritis, right hand: Secondary | ICD-10-CM

## 2023-03-14 DIAGNOSIS — H15103 Unspecified episcleritis, bilateral: Secondary | ICD-10-CM

## 2023-03-16 ENCOUNTER — Other Ambulatory Visit: Payer: Self-pay

## 2023-03-16 ENCOUNTER — Other Ambulatory Visit (HOSPITAL_COMMUNITY): Payer: Self-pay

## 2023-03-23 NOTE — Progress Notes (Signed)
Office Visit Note  Patient: Sarah Ford             Date of Birth: 1961-02-21           MRN: [id number]             PCP: Nelwyn Salisbury, MD Referring: Nelwyn Salisbury, MD Visit Date: 04/04/2023 Occupation: @GUAROCC @  Subjective:  Medication monitoring   History of Present Illness: Sarah Ford is a 62 y.o. female with history of seronegative rheumatoid arthritis and episcleritis.  Patient remains on Humira 40 mg sq injections once every 14 days.  She is tolerating Humira without any side effects or injection site reactions.  She has not missed any doses recently.  Patient denies any signs or symptoms of a rheumatoid arthritis flare.  Patient states she has been having intermittent discomfort in both shoulders especially her right shoulder.  She states the pain extends down into the muscle of her right upper arm.  She states her symptoms are typically worse at night.  She denies any range of motion restriction.  She denies any injury or fall prior to the onset of symptoms.  She denies any discomfort currently.  She has not noticed any joint swelling.  She denies any other joint pain or inflammation at this time.  She denies any episcleritis flares.  She denies any recent or recurrent infections.  She denies any new medical conditions.    Activities of Daily Living:  Patient reports morning stiffness for 0 minutes.   Patient Reports nocturnal pain.  Difficulty dressing/grooming: Denies Difficulty climbing stairs: Denies Difficulty getting out of chair: Denies Difficulty using hands for taps, buttons, cutlery, and/or writing: Denies  Review of Systems  Constitutional:  Positive for fatigue.  HENT:  Positive for mouth dryness. Negative for mouth sores.   Eyes:  Negative for pain, redness, itching, visual disturbance and dryness.  Respiratory:  Negative for shortness of breath.   Cardiovascular:  Negative for chest pain and palpitations.  Gastrointestinal:  Positive for constipation.  Negative for blood in stool and diarrhea.  Endocrine: Negative for increased urination.  Genitourinary:  Negative for involuntary urination.  Musculoskeletal:  Positive for joint pain, joint pain, myalgias and myalgias. Negative for gait problem, joint swelling, muscle weakness, morning stiffness and muscle tenderness.  Skin:  Negative for color change, rash, hair loss and sensitivity to sunlight.  Allergic/Immunologic: Negative for susceptible to infections.  Neurological:  Positive for dizziness. Negative for numbness and headaches.  Hematological:  Negative for swollen glands.  Psychiatric/Behavioral:  Positive for sleep disturbance. Negative for depressed mood. The patient is nervous/anxious.     PMFS History:  Patient Active Problem List   Diagnosis Date Noted   Seronegative rheumatoid arthritis (HCC) 06/26/2021   Pulmonary nodules 03/05/2021   Arthralgia 12/10/2020   Peripheral edema 12/10/2020   Chronic right-sided low back pain with right-sided sciatica 01/25/2020   Constipation 10/24/2019   Environmental and seasonal allergies 10/24/2019   Migraine variant 03/09/2010   APHASIA 03/09/2010   INSOMNIA, CHRONIC 05/29/2007   Depression with anxiety 05/29/2007   Osteoporosis 05/29/2007    Past Medical History:  Diagnosis Date   Allergy    Arthritis    Constipation    Depression    IBS (irritable bowel syndrome)    sees Dr. Kinnie Scales    Insomnia    Osteoporosis    gets Reclast per [facility] Endocrinology    Routine gynecological examination    sees Dr. Rana Snare    Seronegative rheumatoid  arthritis (HCC)    sees Dr. Corliss Skains    Family History  Problem Relation Age of Onset   Cancer Mother    Breast cancer Mother    Thyroid disease Mother    Hypertension Mother    High Cholesterol Mother    Myelodysplastic syndrome Maternal Aunt    Heart failure Father    Kidney failure Father    Diabetes Father    Hypertension Father    High Cholesterol Father    Osteoporosis Sister     Alcohol abuse Sister    Mental illness Sister    Healthy Son    Allergies Daughter    Depression Daughter    Past Surgical History:  Procedure Laterality Date   COLONOSCOPY  08/28/2014   per Dr. Kinnie Scales, clear, repeat in 10 yrs    DILATION AND CURETTAGE OF UTERUS     x2   FOOT SURGERY Right    bone spur   HERNIA REPAIR     umbilical   MEDIAL PARTIAL KNEE REPLACEMENT Left 11/2021   TEMPOROMANDIBULAR JOINT SURGERY  1993   tummy tuck     dr Shon Hough   Social History   Social History Narrative   Not on file   Immunization History  Administered Date(s) Administered   Influenza Inj Mdck Quad With Preservative 06/12/2019   Influenza Split 07/26/2011, 07/05/2012   Influenza Whole 07/05/2007   Influenza,inj,Quad PF,6+ Mos 07/26/2014, 05/30/2015, 06/21/2016, 07/10/2018, 06/08/2022   Influenza-Unspecified 06/20/2017, 07/04/2020, 06/15/2021   PFIZER(Purple Top)SARS-COV-2 Vaccination 11/30/2019, 12/22/2019, 07/17/2020, 02/09/2021   PNEUMOCOCCAL CONJUGATE-20 02/19/2021   Pfizer Covid-19 Vaccine Bivalent Booster 66yrs & up 07/19/2022   Tdap 12/14/2012, 01/10/2023   Zoster Recombinant(Shingrix) 04/10/2018, 10/20/2018     Objective: Vital Signs: BP 103/71 (BP Location: Left Arm, Patient Position: Sitting, Cuff Size: Normal)   Pulse 63   Resp 16   Ht 5' 2.5" (1.588 m)   Wt 155 lb 12.8 oz (70.7 kg)   BMI 28.04 kg/m    Physical Exam Vitals and nursing note reviewed.  Constitutional:      Appearance: She is well-developed.  HENT:     Head: Normocephalic and atraumatic.  Eyes:     Conjunctiva/sclera: Conjunctivae normal.  Cardiovascular:     Rate and Rhythm: Normal rate and regular rhythm.     Heart sounds: Normal heart sounds.  Pulmonary:     Effort: Pulmonary effort is normal.     Breath sounds: Normal breath sounds.  Abdominal:     General: Bowel sounds are normal.     Palpations: Abdomen is soft.  Musculoskeletal:     Cervical back: Normal range of motion.   Lymphadenopathy:     Cervical: No cervical adenopathy.  Skin:    General: Skin is warm and dry.     Capillary Refill: Capillary refill takes less than 2 seconds.  Neurological:     Mental Status: She is alert and oriented to person, place, and time.  Psychiatric:        Behavior: Behavior normal.      Musculoskeletal Exam: C-spine has good range of motion.  Some tenderness over the subacromial bursa of both shoulders.  Some discomfort with crossover of the left shoulder.  Elbow joints, wrist joints, MCPs, PIPs, DIPs have good range of motion with no synovitis.  Complete fist formation bilaterally.  PIP and DIP thickening consistent with osteoarthritis of both hands.  Hip joints have good range of motion with no groin pain.  Knee joints have good range of  motion with no warmth or effusion.  Ankle joints have good range of motion with no tenderness or joint swelling.  No tenderness or synovitis over MTP joints.  CDAI Exam: CDAI Score: -- Patient Global: 20 / 100; Provider Global: 20 / 100 Swollen: --; Tender: -- Joint Exam 04/04/2023   No joint exam has been documented for this visit   There is currently no information documented on the homunculus. Go to the Rheumatology activity and complete the homunculus joint exam.  Investigation: No additional findings.  Imaging: No results found.  Recent Labs: Lab Results  Component Value Date   WBC 6.8 01/14/2023   HGB 14.1 01/14/2023   PLT 273 01/14/2023   NA 139 01/14/2023   K 4.1 01/14/2023   CL 105 01/14/2023   CO2 27 01/14/2023   GLUCOSE 115 (H) 01/14/2023   BUN 15 01/14/2023   CREATININE 1.01 01/14/2023   BILITOT 0.4 01/14/2023   ALKPHOS 82 01/10/2023   AST 23 01/14/2023   ALT 22 01/14/2023   PROT 6.7 01/14/2023   ALBUMIN 4.4 01/10/2023   CALCIUM 9.7 01/14/2023   GFRAA 79 03/11/2021   QFTBGOLDPLUS NEGATIVE 01/14/2023    Speciality Comments: MTX-evaded creatinine, leflunomide-nausea and abdominal discomfort, Humira -  started September 2022  Procedures:  No procedures performed Allergies: Arava [leflunomide], Hydrocodone-acetaminophen, and Tramadol   Assessment / Plan:     Visit Diagnoses: Seronegative rheumatoid arthritis (HCC) - H/o recurrent inflammatory arthritis since February 2022, Positive synovitis, RF-, anti-CCP negative, 14-3-3 eta negative, h/o of episcleritis: She has no synovitis on examination today.  She has not had any signs or symptoms of a rheumatoid arthritis flare.  She has clinically been doing well on Humira 40 mg sq injections once every 14 days.  She is tolerating Humira without any side effects or injection site reactions.  She has not missed any doses of Humira recently.  No recent or recurrent infections.  Her rheumatoid arthritis remains well-controlled on monotherapy.  She was advised to notify us if she develops signs or symptoms of a flare.  She will follow-up in the office in 5 months or sooner if needed.  High risk medication use - Humira 40 mg sq injections once every 14 days. CBC and CMP updated on 01/14/23. Orders for CBC and CMP released today.   TB gold negative on 01/14/23.  No recent or recurrent infections.  Discussed the importance of holding humira if she develops signs or symptoms of an infection and to resume once the infection has completely cleared.  Patient went for a skin cancer screening with her dermatologist in January 2024. No new medical conditions.  - Plan: CBC with Differential/Platelet, COMPLETE METABOLIC PANEL WITH GFR, COMPLETE METABOLIC PANEL WITH GFR, CBC with Differential/Platelet  Episcleritis of both eyes: Followed by Dr. Cathey Endow. She has not had any signs or symptoms of an episcleritis flare. No recurrence since initiating Humira.   Acute pain of both shoulders: Patient has some tenderness over the subacromial bursa of both shoulders extending down the deltoid insertion site bilaterally.  She has good range of motion of both shoulder joints with no  limitation.  Some discomfort with crossover of the left shoulder noted.  No recent injury or fall.  Patient was given a handout of shoulder exercises to perform.  She was advised to notify us if her symptoms persist or worsen.  Primary osteoarthritis of both hands: She has PIP and DIP thickening consistent with osteoarthritis of both hands.  No tenderness or synovitis noted.  Complete fist formation bilaterally.  Dupuytren's contracture of right hand: Unchanged.  Status post left partial knee replacement - Dr. Sherlean Foot 11/25/21-doing well.  Good range of motion with no warmth or effusion.  Primary osteoarthritis of both feet: She is not experiencing any discomfort in her feet at this time.  No tenderness or synovitis over MTP joints.  Idiopathic scoliosis of thoracolumbar region  Other medical conditions are listed as follows:  Nodule of lower lobe of lung  History of osteoporosis - tx IV Reclast (started September 2021) by [facility].  Past tx Fosamax, Actonel and Evista. DEXA updated on 11/22/2019: AP spine T-score -3.6. IV Reclast in 2021, 2022.  Vitamin D deficiency  Depression with anxiety  Dyslipidemia  Environmental and seasonal allergies  Orders: Orders Placed This Encounter  Procedures   CBC with Differential/Platelet   COMPLETE METABOLIC PANEL WITH GFR   COMPLETE METABOLIC PANEL WITH GFR   CBC with Differential/Platelet   No orders of the defined types were placed in this encounter.   Follow-Up Instructions: Return in about 5 months (around 09/04/2023) for Rheumatoid arthritis.   Gearldine Bienenstock, PA-C  Note - This record has been created using Dragon software.  Chart creation errors have been sought, but may not always  have been located. Such creation errors do not reflect on  the standard of medical care.

## 2023-04-04 ENCOUNTER — Ambulatory Visit: Payer: BLUE CROSS/BLUE SHIELD | Attending: Physician Assistant | Admitting: Physician Assistant

## 2023-04-04 ENCOUNTER — Encounter: Payer: Self-pay | Admitting: Physician Assistant

## 2023-04-04 VITALS — BP 103/71 | HR 63 | Resp 16 | Ht 62.5 in | Wt 155.8 lb

## 2023-04-04 DIAGNOSIS — M25512 Pain in left shoulder: Secondary | ICD-10-CM

## 2023-04-04 DIAGNOSIS — Z79899 Other long term (current) drug therapy: Secondary | ICD-10-CM | POA: Diagnosis not present

## 2023-04-04 DIAGNOSIS — J3089 Other allergic rhinitis: Secondary | ICD-10-CM

## 2023-04-04 DIAGNOSIS — M25511 Pain in right shoulder: Secondary | ICD-10-CM

## 2023-04-04 DIAGNOSIS — H15103 Unspecified episcleritis, bilateral: Secondary | ICD-10-CM | POA: Diagnosis not present

## 2023-04-04 DIAGNOSIS — M19072 Primary osteoarthritis, left ankle and foot: Secondary | ICD-10-CM

## 2023-04-04 DIAGNOSIS — R911 Solitary pulmonary nodule: Secondary | ICD-10-CM

## 2023-04-04 DIAGNOSIS — M19042 Primary osteoarthritis, left hand: Secondary | ICD-10-CM

## 2023-04-04 DIAGNOSIS — E785 Hyperlipidemia, unspecified: Secondary | ICD-10-CM

## 2023-04-04 DIAGNOSIS — E559 Vitamin D deficiency, unspecified: Secondary | ICD-10-CM

## 2023-04-04 DIAGNOSIS — F418 Other specified anxiety disorders: Secondary | ICD-10-CM

## 2023-04-04 DIAGNOSIS — M0600 Rheumatoid arthritis without rheumatoid factor, unspecified site: Secondary | ICD-10-CM | POA: Diagnosis not present

## 2023-04-04 DIAGNOSIS — Z96652 Presence of left artificial knee joint: Secondary | ICD-10-CM

## 2023-04-04 DIAGNOSIS — M19071 Primary osteoarthritis, right ankle and foot: Secondary | ICD-10-CM

## 2023-04-04 DIAGNOSIS — M720 Palmar fascial fibromatosis [Dupuytren]: Secondary | ICD-10-CM

## 2023-04-04 DIAGNOSIS — M19041 Primary osteoarthritis, right hand: Secondary | ICD-10-CM

## 2023-04-04 DIAGNOSIS — M4125 Other idiopathic scoliosis, thoracolumbar region: Secondary | ICD-10-CM

## 2023-04-04 DIAGNOSIS — Z8739 Personal history of other diseases of the musculoskeletal system and connective tissue: Secondary | ICD-10-CM

## 2023-04-04 LAB — CBC WITH DIFFERENTIAL/PLATELET
Eosinophils Relative: 3.3 %
HCT: 42.4 % (ref 35.0–45.0)
Hemoglobin: 14.1 g/dL (ref 11.7–15.5)
Lymphs Abs: 2265 cells/uL (ref 850–3900)
MCH: 29.9 pg (ref 27.0–33.0)
MCHC: 33.3 g/dL (ref 32.0–36.0)
MCV: 90 fL (ref 80.0–100.0)
MPV: 11 fL (ref 7.5–12.5)
Monocytes Relative: 9.9 %
Neutro Abs: 3491 cells/uL (ref 1500–7800)
Neutrophils Relative %: 52.1 %
Platelets: 270 10*3/uL (ref 140–400)
RDW: 12.2 % (ref 11.0–15.0)
Total Lymphocyte: 33.8 %

## 2023-04-04 NOTE — Patient Instructions (Addendum)
Standing Labs We placed an order today for your standing lab work.   Please have your standing labs drawn in October and every 3 months  Please have your labs drawn 2 weeks prior to your appointment so that the provider can discuss your lab results at your appointment, if possible.  Please note that you may see your imaging and lab results in MyChart before we have reviewed them. We will contact you once all results are reviewed. Please allow our office up to 72 hours to thoroughly review all of the results before contacting the office for clarification of your results.  WALK-IN LAB HOURS  Monday through Thursday from 8:00 am -12:30 pm and 1:00 pm-5:00 pm and Friday from 8:00 am-12:00 pm.  Patients with office visits requiring labs will be seen before walk-in labs.  You may encounter longer than normal wait times. Please allow additional time. Wait times may be shorter on  Monday and Thursday afternoons.  We do not book appointments for walk-in labs. We appreciate your patience and understanding with our staff.   Labs are drawn by [facility]. Please bring your co-pay at the time of your lab draw.  You may receive a bill from [facility] for your lab work.  Please note if you are on Hydroxychloroquine and and an order has been placed for a Hydroxychloroquine level,  you will need to have it drawn 4 hours or more after your last dose.  If you wish to have your labs drawn at another location, please call the office 24 hours in advance so we can fax the orders.  The office is located at [address], [address], [place], [place] [address]   If you have any questions regarding directions or hours of operation,  please call [phone].   As a reminder, please drink plenty of water prior to coming for your lab work. Thanks!   Shoulder Exercises Ask your health care provider which exercises are safe for you. Do exercises exactly as told by your health care provider and adjust them as directed. It is  normal to feel mild stretching, pulling, tightness, or discomfort as you do these exercises. Stop right away if you feel sudden pain or your pain gets worse. Do not begin these exercises until told by your health care provider. Stretching exercises External rotation and abduction This exercise is sometimes called corner stretch. The exercise rotates your arm outward (external rotation) and moves your arm out from your body (abduction). Stand in a doorway with one of your feet slightly in front of the other. This is called a staggered stance. If you cannot reach your forearms to the door frame, stand facing a corner of a room. Choose one of the following positions as told by your health care provider: Place your hands and forearms on the door frame above your head. Place your hands and forearms on the door frame at the height of your head. Place your hands on the door frame at the height of your elbows. Slowly move your weight onto your front foot until you feel a stretch across your chest and in the front of your shoulders. Keep your head and chest upright and keep your abdominal muscles tight. Hold for __________ seconds. To release the stretch, shift your weight to your back foot. Repeat __________ times. Complete this exercise __________ times a day. Extension, standing  Stand and hold a broomstick, a cane, or a similar object behind your back. Your hands should be a little wider than  shoulder-width apart. Your palms should face away from your back. Keeping your elbows straight and your shoulder muscles relaxed, move the stick away from your body until you feel a stretch in your shoulders (extension). Avoid shrugging your shoulders while you move the stick. Keep your shoulder blades tucked down toward the middle of your back. Hold for __________ seconds. Slowly return to the starting position. Repeat __________ times. Complete this exercise __________ times a day. Range-of-motion  exercises Pendulum  Stand near a wall or a surface that you can hold onto for balance. Bend at the waist and let your left / right arm hang straight down. Use your other arm to support you. Keep your back straight and do not lock your knees. Relax your left / right arm and shoulder muscles, and move your hips and your trunk so your left / right arm swings freely. Your arm should swing because of the motion of your body, not because you are using your arm or shoulder muscles. Keep moving your hips and trunk so your arm swings in the following directions, as told by your health care provider: Side to side. Forward and backward. In clockwise and counterclockwise circles. Continue each motion for __________ seconds, or for as long as told by your health care provider. Slowly return to the starting position. Repeat __________ times. Complete this exercise __________ times a day. Shoulder flexion, standing  Stand and hold a broomstick, a cane, or a similar object. Place your hands a little more than shoulder-width apart on the object. Your left / right hand should be palm-up, and your other hand should be palm-down. Keep your elbow straight and your shoulder muscles relaxed. Push the stick up with your healthy arm to raise your left / right arm in front of your body, and then over your head until you feel a stretch in your shoulder (flexion). Avoid shrugging your shoulder while you raise your arm. Keep your shoulder blade tucked down toward the middle of your back. Hold for __________ seconds. Slowly return to the starting position. Repeat __________ times. Complete this exercise __________ times a day. Shoulder abduction, standing  Stand and hold a broomstick, a cane, or a similar object. Place your hands a little more than shoulder-width apart on the object. Your left / right hand should be palm-up, and your other hand should be palm-down. Keep your elbow straight and your shoulder muscles  relaxed. Push the object across your body toward your left / right side. Raise your left / right arm to the side of your body (abduction) until you feel a stretch in your shoulder. Do not raise your arm above shoulder height unless your health care provider tells you to do that. If directed, raise your arm over your head. Avoid shrugging your shoulder while you raise your arm. Keep your shoulder blade tucked down toward the middle of your back. Hold for __________ seconds. Slowly return to the starting position. Repeat __________ times. Complete this exercise __________ times a day. Internal rotation  Place your left / right hand behind your back, palm-up. Use your other hand to dangle an exercise band, a broomstick, or a similar object over your shoulder. Grasp the band with your left / right hand so you are holding on to both ends. Gently pull up on the band until you feel a stretch in the front of your left / right shoulder. The movement of your arm toward the center of your body is called internal rotation. Avoid shrugging your  shoulder while you raise your arm. Keep your shoulder blade tucked down toward the middle of your back. Hold for __________ seconds. Release the stretch by letting go of the band and lowering your hands. Repeat __________ times. Complete this exercise __________ times a day. Strengthening exercises External rotation  Sit in a stable chair without armrests. Secure an exercise band to a stable object at elbow height on your left / right side. Place a soft object, such as a folded towel or a small pillow, between your left / right upper arm and your body to move your elbow about 4 inches (10 cm) away from your side. Hold the end of the exercise band so it is tight and there is no slack. Keeping your elbow pressed against the soft object, slowly move your forearm out, away from your abdomen (external rotation). Keep your body steady so only your forearm moves. Hold for  __________ seconds. Slowly return to the starting position. Repeat __________ times. Complete this exercise __________ times a day. Shoulder abduction  Sit in a stable chair without armrests, or stand up. Hold a __________ lb / kg weight in your left / right hand, or hold an exercise band with both hands. Start with your arms straight down and your left / right palm facing in, toward your body. Slowly lift your left / right hand out to your side (abduction). Do not lift your hand above shoulder height unless your health care provider tells you that this is safe. Keep your arms straight. Avoid shrugging your shoulder while you do this movement. Keep your shoulder blade tucked down toward the middle of your back. Hold for __________ seconds. Slowly lower your arm, and return to the starting position. Repeat __________ times. Complete this exercise __________ times a day. Shoulder extension  Sit in a stable chair without armrests, or stand up. Secure an exercise band to a stable object in front of you so it is at shoulder height. Hold one end of the exercise band in each hand. Straighten your elbows and lift your hands up to shoulder height. Squeeze your shoulder blades together as you pull your hands down to the sides of your thighs (extension). Stop when your hands are straight down by your sides. Do not let your hands go behind your body. Hold for __________ seconds. Slowly return to the starting position. Repeat __________ times. Complete this exercise __________ times a day. Shoulder row  Sit in a stable chair without armrests, or stand up. Secure an exercise band to a stable object in front of you so it is at chest height. Hold one end of the exercise band in each hand. Position your palms so that your thumbs are facing the ceiling (neutral position). Bend each of your elbows to a 90-degree angle (right angle) and keep your upper arms at your sides. Step back or move the chair back  until the band is tight and there is no slack. Slowly pull your elbows back behind you. Hold for __________ seconds. Slowly return to the starting position. Repeat __________ times. Complete this exercise __________ times a day. Shoulder press-ups  Sit in a stable chair that has armrests. Sit upright, with your feet flat on the floor. Put your hands on the armrests so your elbows are bent and your fingers are pointing forward. Your hands should be about even with the sides of your body. Push down on the armrests and use your arms to lift yourself off the chair. Straighten your elbows  and lift yourself up as much as you comfortably can. Move your shoulder blades down, and avoid letting your shoulders move up toward your ears. Keep your feet on the ground. As you get stronger, your feet should support less of your body weight as you lift yourself up. Hold for __________ seconds. Slowly lower yourself back into the chair. Repeat __________ times. Complete this exercise __________ times a day. Wall push-ups  Stand so you are facing a stable wall. Your feet should be about one arm-length away from the wall. Lean forward and place your palms on the wall at shoulder height. Keep your feet flat on the floor as you bend your elbows and lean forward toward the wall. Hold for __________ seconds. Straighten your elbows to push yourself back to the starting position. Repeat __________ times. Complete this exercise __________ times a day. This information is not intended to replace advice given to you by your health care provider. Make sure you discuss any questions you have with your health care provider. Document Revised: 10/27/2021 Document Reviewed: 10/27/2021 Elsevier Patient Education  2024 [employer].

## 2023-04-05 LAB — CBC WITH DIFFERENTIAL/PLATELET
Absolute Monocytes: 663 cells/uL (ref 200–950)
Basophils Absolute: 60 cells/uL (ref 0–200)
Basophils Relative: 0.9 %
Eosinophils Absolute: 221 cells/uL (ref 15–500)
RBC: 4.71 10*6/uL (ref 3.80–5.10)
WBC: 6.7 10*3/uL (ref 3.8–10.8)

## 2023-04-05 LAB — COMPLETE METABOLIC PANEL WITH GFR
AG Ratio: 1.6 (calc) (ref 1.0–2.5)
ALT: 21 U/L (ref 6–29)
AST: 22 U/L (ref 10–35)
Albumin: 4.4 g/dL (ref 3.6–5.1)
Alkaline phosphatase (APISO): 78 U/L (ref 37–153)
BUN: 11 mg/dL (ref 7–25)
CO2: 27 mmol/L (ref 20–32)
Calcium: 9.5 mg/dL (ref 8.6–10.4)
Chloride: 103 mmol/L (ref 98–110)
Creat: 1.02 mg/dL (ref 0.50–1.03)
Globulin: 2.7 g/dL (calc) (ref 1.9–3.7)
Glucose, Bld: 105 mg/dL — ABNORMAL HIGH (ref 65–99)
Potassium: 4.3 mmol/L (ref 3.5–5.3)
Sodium: 138 mmol/L (ref 135–146)
Total Bilirubin: 0.4 mg/dL (ref 0.2–1.2)
Total Protein: 7.1 g/dL (ref 6.1–8.1)
eGFR: 63 mL/min/{1.73_m2} (ref >=60)

## 2023-04-05 NOTE — Progress Notes (Signed)
Glucose is 105. Rest of CMP WNL.  CBC WNL.

## 2023-04-06 ENCOUNTER — Encounter: Payer: Self-pay | Admitting: Rheumatology

## 2023-04-06 NOTE — Telephone Encounter (Signed)
I called patient.  She states she does not recall any trauma.  She appears to have a small capillary or small vessel bleed based on the picture.  Her recent labs from July were within normal limits which were reviewed.  She is not taking any anticoagulants.  I advised her to contact us or her PCP if she develops any new symptoms.  Patient voiced understanding.

## 2023-04-14 ENCOUNTER — Other Ambulatory Visit (HOSPITAL_COMMUNITY): Payer: Self-pay

## 2023-04-19 ENCOUNTER — Other Ambulatory Visit: Payer: Self-pay

## 2023-04-20 ENCOUNTER — Other Ambulatory Visit (HOSPITAL_COMMUNITY): Payer: Self-pay

## 2023-04-20 ENCOUNTER — Other Ambulatory Visit: Payer: Self-pay | Admitting: Physician Assistant

## 2023-04-20 ENCOUNTER — Other Ambulatory Visit: Payer: Self-pay

## 2023-04-20 DIAGNOSIS — M0600 Rheumatoid arthritis without rheumatoid factor, unspecified site: Secondary | ICD-10-CM

## 2023-04-20 MED ORDER — HUMIRA (2 PEN) 40 MG/0.4ML ~~LOC~~ AJKT
40.0000 mg | AUTO-INJECTOR | SUBCUTANEOUS | 0 refills | Status: DC
  Filled 2023-04-20: qty 6, 84d supply, fill #0
  Filled 2023-05-17: qty 2, 28d supply, fill #0
  Filled 2023-06-22: qty 2, 28d supply, fill #1
  Filled 2023-07-14: qty 2, 28d supply, fill #2

## 2023-04-20 NOTE — Telephone Encounter (Signed)
Last Fill: 02/07/2023  Labs: 04/04/2023 Glucose is 105. Rest of CMP WNL. CBC WNL.   TB Gold: 01/14/2023 Neg    Next Visit: 09/05/2023  Last Visit: 04/04/2023  [id number] rheumatoid arthritis   Current Dose per office note 04/04/2023: Humira 40 mg sq injections once every 14 days.   Okay to refill Humira?

## 2023-04-28 ENCOUNTER — Ambulatory Visit: Payer: BLUE CROSS/BLUE SHIELD | Admitting: Internal Medicine

## 2023-05-11 ENCOUNTER — Encounter: Payer: Self-pay | Admitting: Family Medicine

## 2023-05-13 ENCOUNTER — Other Ambulatory Visit: Payer: Self-pay | Admitting: Adult Health

## 2023-05-13 MED ORDER — LORAZEPAM 2 MG PO TABS: 4.0000 mg | ORAL_TABLET | Freq: Every day | ORAL | 0 refills | Status: DC

## 2023-05-13 NOTE — Telephone Encounter (Signed)
Pt LOV was on 01/10/23 Last refill done on 12/14/22 Pt is leaving town tomorrow and requesting refill on her Lorazepam. Pt PCP is not available.  Please advise

## 2023-05-17 ENCOUNTER — Other Ambulatory Visit (HOSPITAL_COMMUNITY): Payer: Self-pay

## 2023-05-17 ENCOUNTER — Other Ambulatory Visit: Payer: Self-pay

## 2023-05-27 ENCOUNTER — Encounter: Payer: Self-pay | Admitting: Rheumatology

## 2023-05-27 IMAGING — MR MR BREAST WO/W CM  BILAT
5 series · 30 of 48 positions shown · IV contrast (6ml GADAVIST)
Comparison: Previous exam(s).

CLINICAL DATA: Abbreviated Breast MRI for breast cancer screening.
Patient's mother had breast cancer at the age of 70.

LABS:  None obtained at the time of imaging.
EXAM:
BILATERAL ABBREVIATED BREAST MRI WITH AND WITHOUT CONTRAST
TECHNIQUE: Multiplanar, multisequence MR images of both breasts were obtained
prior to and following the intravenous administration of 6 ml of
Gadavist

[Series 2: t2_tirm_tra ipat (a-p) · axial · 3.0mm · 0.70mm/px · z∈[-99,+63]mm · 5 of 55 slices shown]
[im 1/55]
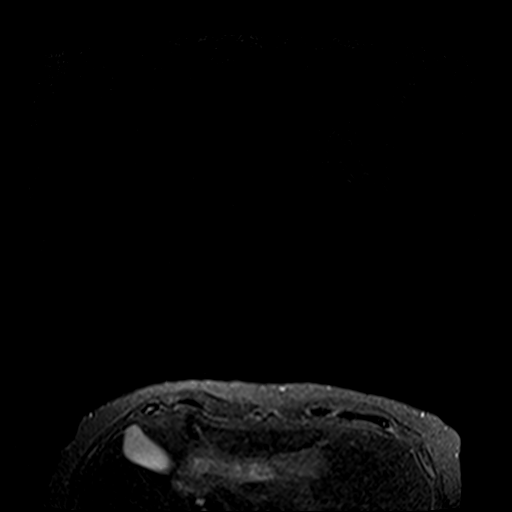
[im 14/55]
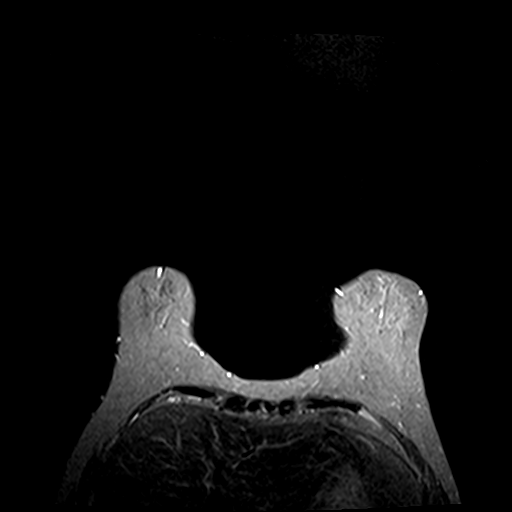
[im 28/55]
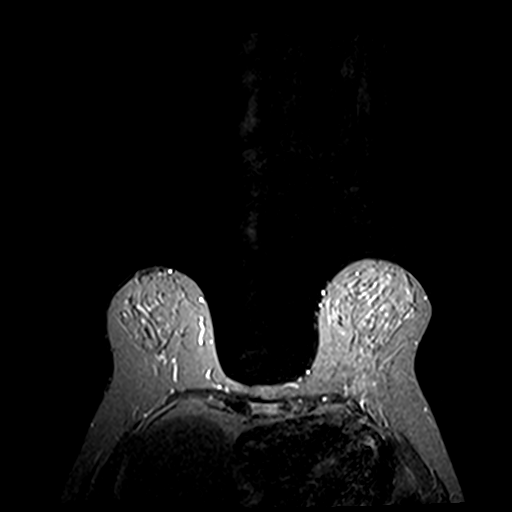
[im 41/55]
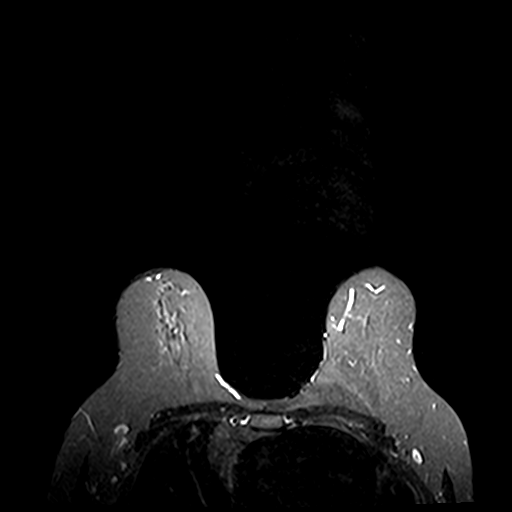
[im 55/55]
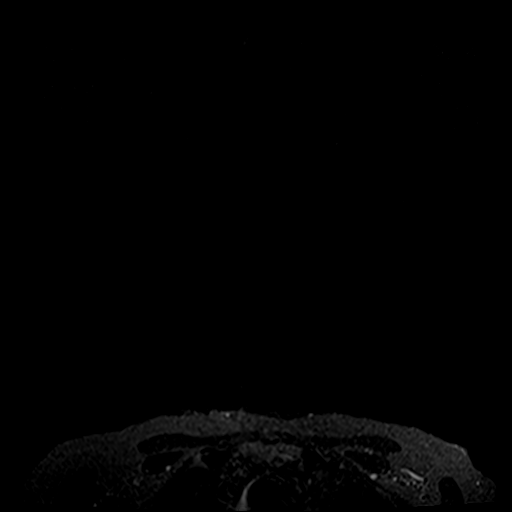

[Series 3: fl3d pre-cm · axial · non-contrast · 1.2mm · 0.94mm/px · z∈[-99,+73]mm · 8 of 144 slices shown]
[im 1/144]
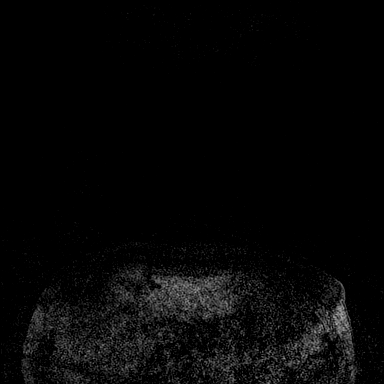
[im 23/144]
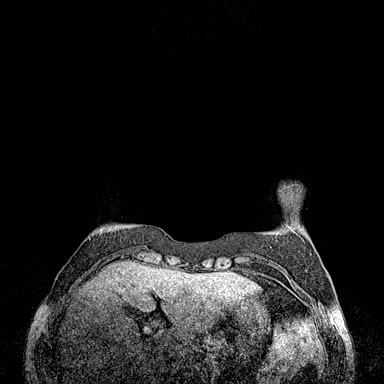
[im 45/144]
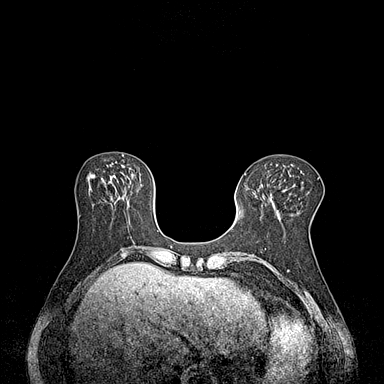
[im 67/144]
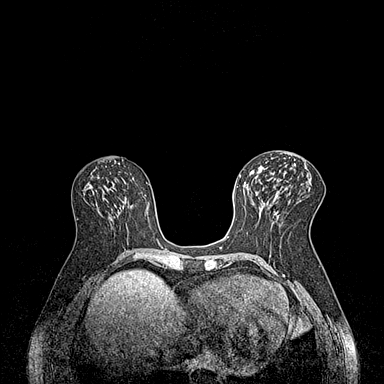
[im 78/144]
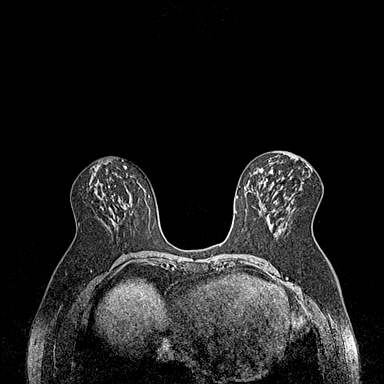
[im 100/144]
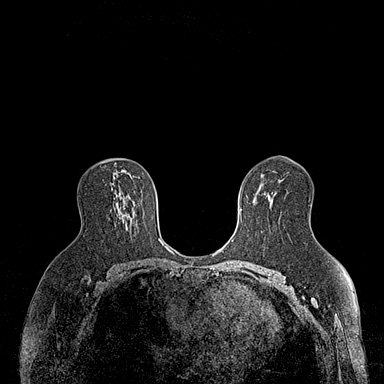
[im 122/144]
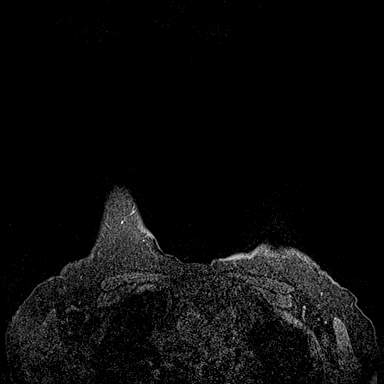
[im 144/144]
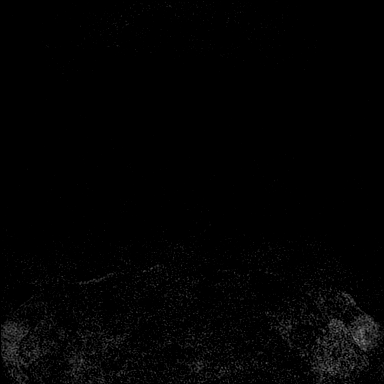

[Series 4: fl3d post-cm 20 · axial · 1.2mm · 0.94mm/px · z∈[-99,+73]mm · 8 of 144 slices shown (1 of 3)]
[im 1/144]
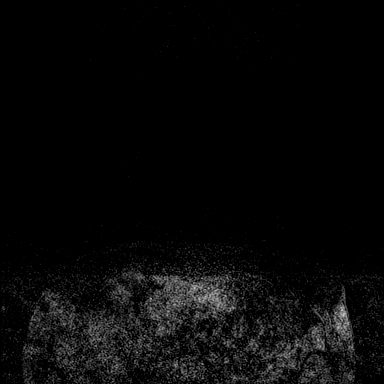
[im 23/144]
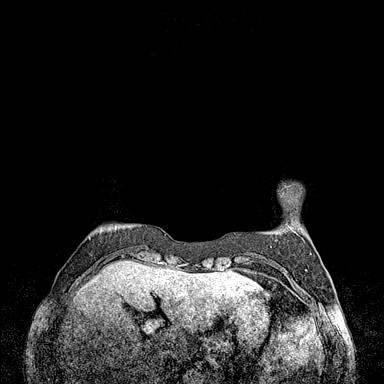
[im 45/144]
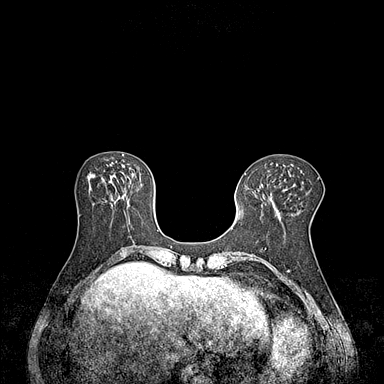
[im 67/144]
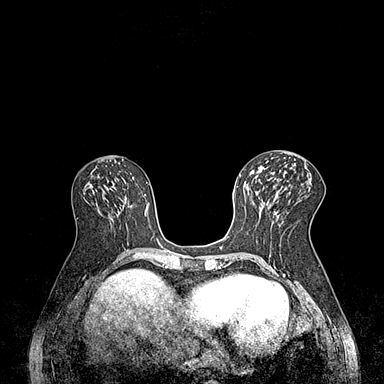
[im 78/144]
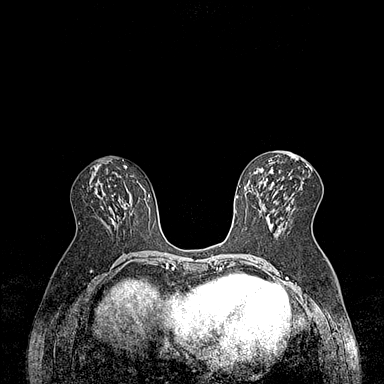
[im 100/144]
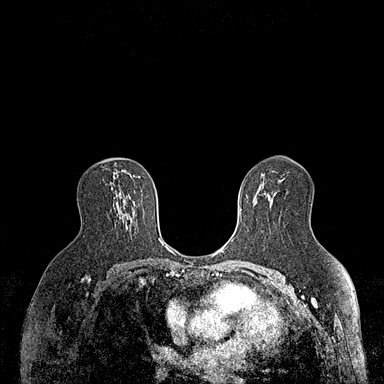
[im 122/144]
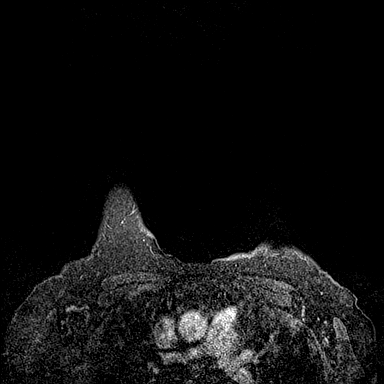
[im 144/144]
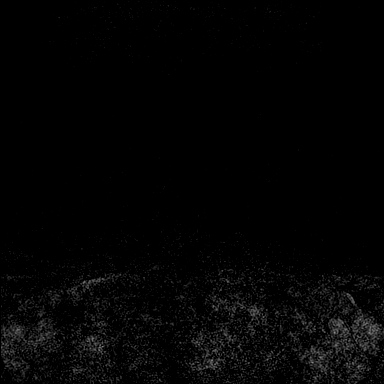

[Series 5: fl3d post-cm 20 · axial · 1.2mm · 0.94mm/px · z∈[-99,+73]mm · 8 of 144 slices shown (2 of 3)]
[im 1/144]
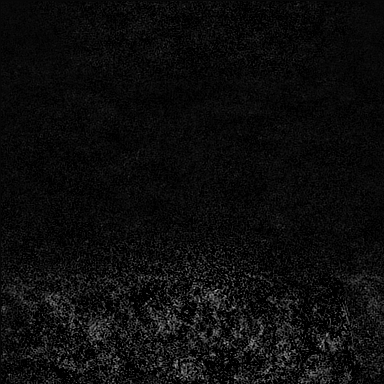
[im 23/144]
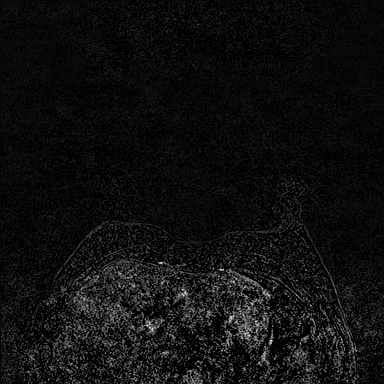
[im 45/144]
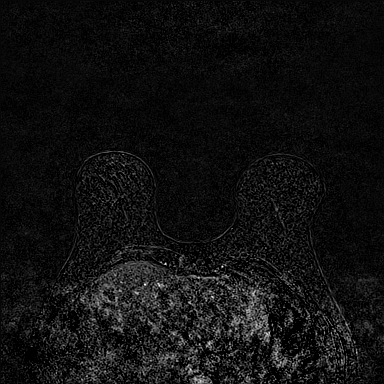
[im 67/144]
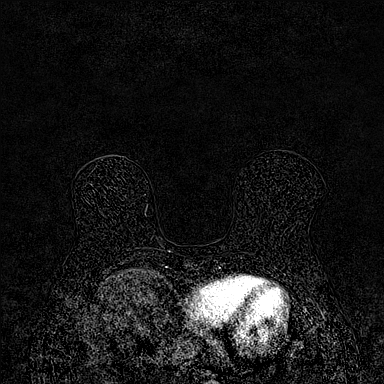
[im 78/144]
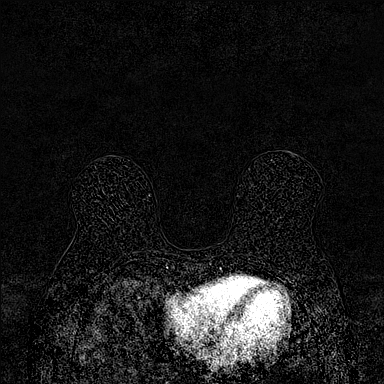
[im 100/144]
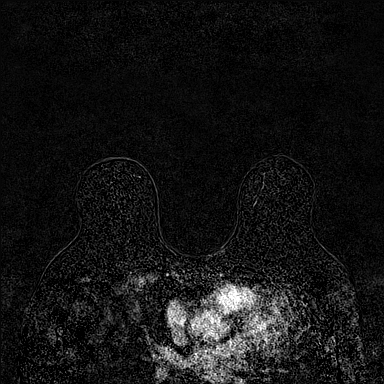
[im 122/144]
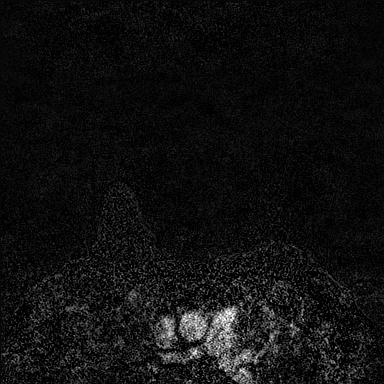
[im 144/144]
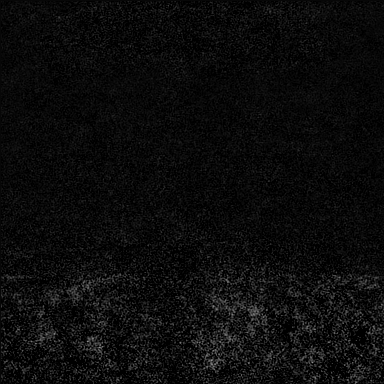

[Series 6: fl3d post-cm 20 · axial · 172.8mm · 0.94mm/px · 1 of 1 slices shown (3 of 3)]
[im 1/1]
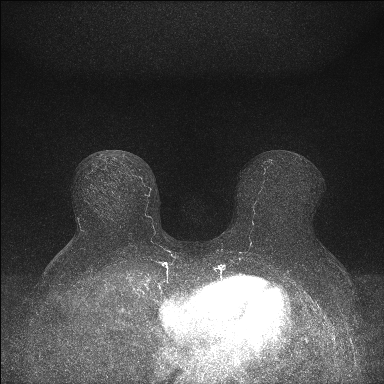

[30 of 48 positions shown; findings below may reference images not displayed]

Three-dimensional MR images were rendered by post-processing of the
original MR data on an independent workstation. The
three-dimensional MR images were interpreted, and findings are
reported in the following complete MRI report for this study. Three
dimensional images were evaluated at the independent [employer]
workstation
FINDINGS: Breast composition: c. Heterogeneous fibroglandular tissue.

Background parenchymal enhancement: Moderate.

Right breast: No mass or abnormal enhancement.

Left breast: No mass or abnormal enhancement.

Lymph nodes: No abnormal appearing lymph nodes.

Ancillary findings:  None.
IMPRESSION: No abnormal enhancement seen in either breast.

RECOMMENDATION:
Recommend annual screening mammography. The patient is also eligible
for annual Abbreviated Breast MRI if she wishes.

BI-RADS CATEGORY  1: Negative.

## 2023-06-01 ENCOUNTER — Other Ambulatory Visit: Payer: Self-pay | Admitting: Family Medicine

## 2023-06-01 ENCOUNTER — Encounter: Payer: Self-pay | Admitting: Family Medicine

## 2023-06-02 MED ORDER — DIAZEPAM 10 MG PO TABS: ORAL_TABLET | ORAL | 5 refills | Status: DC

## 2023-06-02 NOTE — Telephone Encounter (Signed)
Done

## 2023-06-08 ENCOUNTER — Telehealth: Payer: Self-pay

## 2023-06-08 NOTE — Telephone Encounter (Signed)
Per Rema Jasmine, current Prior Authorization is expiring.  Submitted a Prior Authorization request to [facility] for HUMIRA via CoverMyMeds. Will update once we receive a response.   Key: [id number]

## 2023-06-10 NOTE — Telephone Encounter (Signed)
Received notification from [facility] regarding a prior authorization for HUMIRA. Authorization has been APPROVED from 06/10/2023 to 06/08/24.  Authorization # [id number]  [employer] updated  Chesley Mires, PharmD, MPH, BCPS, CPP Clinical Pharmacist (Rheumatology and Pulmonology)

## 2023-06-11 ENCOUNTER — Other Ambulatory Visit: Payer: Self-pay | Admitting: Adult Health

## 2023-06-13 NOTE — Telephone Encounter (Signed)
Pt LOV was on 01/10/23 Last refill was done on 05/13/23 Please advise

## 2023-06-14 MED ORDER — LORAZEPAM 2 MG PO TABS: 4.0000 mg | ORAL_TABLET | Freq: Every day | ORAL | 5 refills | Status: DC

## 2023-06-22 ENCOUNTER — Other Ambulatory Visit: Payer: Self-pay

## 2023-06-22 NOTE — Progress Notes (Signed)
Clinical Intervention Notes: Patient states that she received the flu vaccine on the same day as a Humira dose and felt poorly afterward. She asked how she should proceed in receiving COVID vaccine. Advised that she should attempt to obtain this vaccine in between Humira doses if possible and at least wait 48-72 hours between them to avoid further reactions.   Clinical Intervention Outcomes: Prevention of an adverse drug event

## 2023-06-22 NOTE — Progress Notes (Signed)
Specialty Pharmacy Refill Coordination Note  Sarah Ford is a 62 y.o. female contacted today regarding refills of specialty medication(s) Adalimumab   Patient requested Delivery   Delivery date: 06/28/23   Verified address: [address], [place] [address]   Medication will be filled on 10/7.

## 2023-06-22 NOTE — Progress Notes (Signed)
Specialty Pharmacy Ongoing Clinical Assessment Note  Sarah Ford is a 62 y.o. female who is being followed by the specialty pharmacy service for RxSp Rheumatoid Arthritis   Patient's specialty medication(s) reviewed today: Adalimumab   Missed doses in the last 4 weeks: 0   Patient did not have any additional questions or concerns.   Therapeutic benefit summary: Patient is achieving benefit   Adverse events/side effects summary: No adverse events/side effects   Patient's therapy is appropriate to: Continue    Goals Addressed             This Visit's Progress    Reduce signs and symptoms       Patient is on track. Patient will maintain adherence         Follow up:  6 months

## 2023-06-27 ENCOUNTER — Other Ambulatory Visit (HOSPITAL_COMMUNITY): Payer: Self-pay

## 2023-06-28 ENCOUNTER — Encounter: Payer: Self-pay | Admitting: Rheumatology

## 2023-06-29 ENCOUNTER — Other Ambulatory Visit: Payer: Self-pay | Admitting: *Deleted

## 2023-06-29 DIAGNOSIS — Z79899 Other long term (current) drug therapy: Secondary | ICD-10-CM

## 2023-07-05 LAB — COMPLETE METABOLIC PANEL WITH GFR
AG Ratio: 1.5 (calc) (ref 1.0–2.5)
ALT: 27 U/L (ref 6–29)
AST: 26 U/L (ref 10–35)
Albumin: 4.4 g/dL (ref 3.6–5.1)
Alkaline phosphatase (APISO): 84 U/L (ref 37–153)
BUN/Creatinine Ratio: 12 (calc) (ref 6–22)
BUN: 14 mg/dL (ref 7–25)
CO2: 25 mmol/L (ref 20–32)
Calcium: 9.8 mg/dL (ref 8.6–10.4)
Chloride: 101 mmol/L (ref 98–110)
Creat: 1.15 mg/dL — ABNORMAL HIGH (ref 0.50–1.03)
Globulin: 2.9 g/dL (ref 1.9–3.7)
Glucose, Bld: 115 mg/dL (ref 65–139)
Potassium: 4 mmol/L (ref 3.5–5.3)
Sodium: 138 mmol/L (ref 135–146)
Total Bilirubin: 0.5 mg/dL (ref 0.2–1.2)
Total Protein: 7.3 g/dL (ref 6.1–8.1)
eGFR: 55 mL/min/{1.73_m2} — ABNORMAL LOW (ref >=60)

## 2023-07-05 LAB — CBC WITH DIFFERENTIAL/PLATELET
Absolute Monocytes: 549 {cells}/uL (ref 200–950)
Basophils Absolute: 40 {cells}/uL (ref 0–200)
Basophils Relative: 0.6 %
Eosinophils Absolute: 181 {cells}/uL (ref 15–500)
Eosinophils Relative: 2.7 %
HCT: 45.2 % — ABNORMAL HIGH (ref 35.0–45.0)
Hemoglobin: 15.1 g/dL (ref 11.7–15.5)
Lymphs Abs: 2660 {cells}/uL (ref 850–3900)
MCH: 30.2 pg (ref 27.0–33.0)
MCHC: 33.4 g/dL (ref 32.0–36.0)
MCV: 90.4 fL (ref 80.0–100.0)
MPV: 10.8 fL (ref 7.5–12.5)
Monocytes Relative: 8.2 %
Neutro Abs: 3270 {cells}/uL (ref 1500–7800)
Neutrophils Relative %: 48.8 %
Platelets: 260 10*3/uL (ref 140–400)
RBC: 5 10*6/uL (ref 3.80–5.10)
RDW: 12.1 % (ref 11.0–15.0)
Total Lymphocyte: 39.7 %
WBC: 6.7 10*3/uL (ref 3.8–10.8)

## 2023-07-05 NOTE — Progress Notes (Signed)
Avoid the use of NSAIDs and remain hydrated.

## 2023-07-05 NOTE — Progress Notes (Signed)
CBC WNL  Creatinine is elevated-1.15 and GFR is low-55. Please clarify if she has had any change in medication? Any recent NSAID use?

## 2023-07-08 ENCOUNTER — Encounter: Payer: Self-pay | Admitting: Family Medicine

## 2023-07-08 ENCOUNTER — Ambulatory Visit: Payer: Self-pay | Admitting: Family Medicine

## 2023-07-08 ENCOUNTER — Ambulatory Visit (INDEPENDENT_AMBULATORY_CARE_PROVIDER_SITE_OTHER): Payer: BLUE CROSS/BLUE SHIELD | Admitting: Family Medicine

## 2023-07-08 VITALS — BP 118/80 | HR 99 | Temp 98.3°F | Wt 152.6 lb

## 2023-07-08 DIAGNOSIS — R7989 Other specified abnormal findings of blood chemistry: Secondary | ICD-10-CM | POA: Diagnosis not present

## 2023-07-08 DIAGNOSIS — R5383 Other fatigue: Secondary | ICD-10-CM

## 2023-07-08 LAB — TSH: TSH: 6.13 u[IU]/mL — ABNORMAL HIGH (ref 0.35–5.50)

## 2023-07-08 LAB — T4, FREE: Free T4: 0.67 ng/dL (ref 0.60–1.60)

## 2023-07-08 LAB — T3, FREE: T3, Free: 3 pg/mL (ref 2.3–4.2)

## 2023-07-08 NOTE — Progress Notes (Signed)
Subjective:    Patient ID: Sarah Ford, female    DOB: 03-19-1961, 62 y.o.   MRN: [id number]  HPI Here for several weeks of fatigue. The only thing different that she can think of is she has not been sleeping well. No recent medication changes. She had labs per Dr. Corliss Skains on 07-04-23 including a CBC and a CMET, and these were unremarkable.    Review of Systems  Constitutional:  Positive for fatigue.  Respiratory: Negative.    Cardiovascular: Negative.   Gastrointestinal: Negative.   Genitourinary: Negative.   Neurological: Negative.        Objective:   Physical Exam Constitutional:      Appearance: Normal appearance.  Cardiovascular:     Rate and Rhythm: Normal rate and regular rhythm.     Pulses: Normal pulses.     Heart sounds: Normal heart sounds.  Pulmonary:     Effort: Pulmonary effort is normal.     Breath sounds: Normal breath sounds.  Neurological:     General: No focal deficit present.     Mental Status: She is alert and oriented to person, place, and time.  Psychiatric:        Mood and Affect: Mood normal.           Assessment & Plan:  Fatigue. Her poor sleep recently may be the cause, but we will screen with a thyroid panel today.  Gershon Crane, MD

## 2023-07-10 ENCOUNTER — Encounter: Payer: Self-pay | Admitting: Family Medicine

## 2023-07-12 ENCOUNTER — Encounter: Payer: Self-pay | Admitting: Family Medicine

## 2023-07-13 MED ORDER — LEVOTHYROXINE SODIUM 75 MCG PO TABS: 75.0000 ug | ORAL_TABLET | Freq: Every day | ORAL | 3 refills | Status: DC

## 2023-07-13 NOTE — Addendum Note (Signed)
Addended by: Johnella Moloney on: 07/13/2023 03:18 PM   Modules accepted: Orders

## 2023-07-14 ENCOUNTER — Encounter: Payer: Self-pay | Admitting: Family Medicine

## 2023-07-14 ENCOUNTER — Other Ambulatory Visit: Payer: Self-pay

## 2023-07-14 NOTE — Progress Notes (Signed)
Specialty Pharmacy Refill Coordination Note  Sarah Ford is a 62 y.o. female contacted today regarding refills of specialty medication(s) Adalimumab   Patient requested Delivery   Delivery date: 07/21/23   Verified address: [address] [place] [address]   Medication will be filled on 07/20/23.

## 2023-07-14 NOTE — Telephone Encounter (Signed)
Lab results reviewed with pt New Rx for thyroid was sent to pt pharmacy verbalized understanding

## 2023-07-15 NOTE — Telephone Encounter (Signed)
I gave her a 6 month supply on 06-14-23

## 2023-07-20 ENCOUNTER — Telehealth: Payer: Self-pay

## 2023-07-20 ENCOUNTER — Other Ambulatory Visit (HOSPITAL_COMMUNITY): Payer: Self-pay

## 2023-07-20 ENCOUNTER — Other Ambulatory Visit: Payer: Self-pay

## 2023-07-20 NOTE — Telephone Encounter (Signed)
Encounter opened under incorrect context, please disregard. °

## 2023-07-20 NOTE — Telephone Encounter (Signed)
Received notification from [facility] that pt's medication is requiring a new PA despite having an approval already on file from 06/10/23-06/09/23. This is a known issue with [employer] plans and a new PA will need to be submitted to "remind" the insurance plan that there is already an [place] on file.  Submitted an URGENT Prior Authorization request to [facility] for HUMIRA via CoverMyMeds. Will update once we receive a response.  Key: [id number]

## 2023-07-22 NOTE — Telephone Encounter (Signed)
Per response on CMM: Authorization On File.Authorization On File.Please note previous Berkley Harvey updated is effective through 06/07/2024 , test claim pays . If you would like another copy of the approval letter, please call [phone] option 3, 2.  Chesley Mires, PharmD, MPH, BCPS, CPP Clinical Pharmacist (Rheumatology and Pulmonology)

## 2023-08-02 ENCOUNTER — Encounter: Payer: Self-pay | Admitting: Family Medicine

## 2023-08-11 ENCOUNTER — Other Ambulatory Visit (HOSPITAL_COMMUNITY): Payer: Self-pay

## 2023-08-11 ENCOUNTER — Other Ambulatory Visit: Payer: Self-pay | Admitting: Physician Assistant

## 2023-08-11 DIAGNOSIS — M0600 Rheumatoid arthritis without rheumatoid factor, unspecified site: Secondary | ICD-10-CM

## 2023-08-11 MED ORDER — HUMIRA (2 PEN) 40 MG/0.4ML ~~LOC~~ AJKT
40.0000 mg | AUTO-INJECTOR | SUBCUTANEOUS | 0 refills | Status: DC
  Filled 2023-08-11: qty 2, 28d supply, fill #0
  Filled 2023-08-22 – 2023-09-21 (×2): qty 2, 28d supply, fill #1
  Filled 2023-09-26 – 2023-10-13 (×2): qty 2, 28d supply, fill #2

## 2023-08-11 NOTE — Telephone Encounter (Signed)
Last Fill: 04/20/2023  Labs: 07/04/2023 CBC WNL Creatinine is elevated-1.15 and GFR is low-55.  TB Gold: 01/14/2023 Neg    Next Visit: 09/05/2023  Last Visit: 04/04/2023  [id number] rheumatoid arthritis   Current Dose per office note 04/04/2023: Humira 40 mg sq injections once every 14 days.   Okay to refill Humira?

## 2023-08-11 NOTE — Progress Notes (Signed)
Specialty Pharmacy Refill Coordination Note  Sarah Ford is a 62 y.o. female contacted today regarding refills of specialty medication(s) Adalimumab   Patient requested Delivery   Delivery date: 08/23/23   Verified address: [address] [place] [address]   Medication will be filled on 08/22/23. *Pending refill request*

## 2023-08-22 ENCOUNTER — Other Ambulatory Visit: Payer: Self-pay

## 2023-08-22 ENCOUNTER — Encounter: Payer: Self-pay | Admitting: Rheumatology

## 2023-08-22 ENCOUNTER — Other Ambulatory Visit (HOSPITAL_COMMUNITY): Payer: Self-pay

## 2023-08-22 NOTE — Progress Notes (Signed)
Office Visit Note  Patient: Sarah Ford             Date of Birth: Jul 12, 1961           MRN: [id number]             PCP: Nelwyn Salisbury, MD Referring: Nelwyn Salisbury, MD Visit Date: 09/05/2023 Occupation: @GUAROCC @  Subjective:  Pain in both shoulders   History of Present Illness: Sarah Ford is a 62 y.o. female with history of seronegative rheumatoid arthritis.  Patient remains on  Humira 40 mg sq injections once every 14 days.  She is tolerating Humira without any side effects or injection site reactions.  She has not had any recent gaps in therapy.  She denies any recent or recurrent infections.  She denies any signs or symptoms of an episcleritis flare.  Patient has been experiencing discomfort in both shoulders at night for the past 1 year.  She describes the pain as an aching throbbing sensation involving both shoulders, right greater than left.  At times the pain will be a 7 out of 10 at night.  She has been trying to put off a cortisone injection. Patient states that she has been diagnosed with hypothyroidism by Dr. Clent Ridges.  She has been started on Synthroid and has noticed a significant improvement in her energy level. She is scheduled to see her neurologist on Wednesday and is scheduled to receive the IV Reclast infusion on Friday.  Activities of Daily Living:  Patient reports morning stiffness for 0 minutes  Patient Reports nocturnal pain.  Difficulty dressing/grooming: Denies Difficulty climbing stairs: Denies Difficulty getting out of chair: Denies Difficulty using hands for taps, buttons, cutlery, and/or writing: Denies  Review of Systems  Constitutional:  Negative for fatigue.  HENT:  Positive for mouth dryness. Negative for mouth sores and nose dryness.   Eyes:  Negative for pain, visual disturbance and dryness.  Respiratory:  Negative for cough, hemoptysis, shortness of breath and difficulty breathing.   Cardiovascular:  Negative for chest pain, palpitations,  hypertension and swelling in legs/feet.  Gastrointestinal:  Positive for constipation. Negative for blood in stool and diarrhea.  Endocrine: Negative for increased urination.  Genitourinary:  Negative for painful urination.  Musculoskeletal:  Negative for joint pain, joint pain, joint swelling, myalgias, muscle weakness, morning stiffness, muscle tenderness and myalgias.  Skin:  Negative for color change, pallor, rash, hair loss, nodules/bumps, skin tightness, ulcers and sensitivity to sunlight.  Allergic/Immunologic: Negative for susceptible to infections.  Neurological:  Negative for dizziness, numbness, headaches and weakness.  Hematological:  Negative for swollen glands.  Psychiatric/Behavioral:  Negative for depressed mood and sleep disturbance. The patient is nervous/anxious.     PMFS History:  Patient Active Problem List   Diagnosis Date Noted   Seronegative rheumatoid arthritis (HCC) 06/26/2021   Pulmonary nodules 03/05/2021   Arthralgia 12/10/2020   Peripheral edema 12/10/2020   Chronic right-sided low back pain with right-sided sciatica 01/25/2020   Constipation 10/24/2019   Environmental and seasonal allergies 10/24/2019   Migraine variant 03/09/2010   APHASIA 03/09/2010   INSOMNIA, CHRONIC 05/29/2007   Depression with anxiety 05/29/2007   Osteoporosis 05/29/2007    Past Medical History:  Diagnosis Date   Allergy    Arthritis    Constipation    Depression    IBS (irritable bowel syndrome)    sees Dr. Kinnie Scales    Insomnia    Osteoporosis    gets Reclast per [facility]    Routine  gynecological examination    sees Dr. Rana Snare    Seronegative rheumatoid arthritis [facility])    sees Dr. Corliss Skains    Family History  Problem Relation Age of Onset   Cancer Mother    Breast cancer Mother    Thyroid disease Mother    Hypertension Mother    High Cholesterol Mother    Myelodysplastic syndrome Maternal Aunt    Heart failure Father    Kidney failure Father    Diabetes  Father    Hypertension Father    High Cholesterol Father    Osteoporosis Sister    Alcohol abuse Sister    Mental illness Sister    Healthy Son    Allergies Daughter    Depression Daughter    Past Surgical History:  Procedure Laterality Date   COLONOSCOPY  08/28/2014   per Dr. Kinnie Scales, clear, repeat in 10 yrs    DILATION AND CURETTAGE OF UTERUS     x2   FOOT SURGERY Right    bone spur   HERNIA REPAIR     umbilical   MEDIAL PARTIAL KNEE REPLACEMENT Left 11/2021   TEMPOROMANDIBULAR JOINT SURGERY  1993   tummy tuck     dr Shon Hough   Social History   Social History Narrative   Not on file   Immunization History  Administered Date(s) Administered   Influenza Inj Mdck Quad With Preservative 06/12/2019   Influenza Split 07/26/2011, 07/05/2012   Influenza Whole 07/05/2007   Influenza,inj,Quad PF,6+ Mos 07/26/2014, 05/30/2015, 06/21/2016, 07/10/2018, 06/08/2022   Influenza-Unspecified 06/20/2017, 07/04/2020, 06/15/2021   PFIZER(Purple Top)SARS-COV-2 Vaccination 11/30/2019, 12/22/2019, 07/17/2020, 02/09/2021   PNEUMOCOCCAL CONJUGATE-20 02/19/2021   Pfizer Covid-19 Vaccine Bivalent Booster 74yrs & up 07/19/2022   Tdap 12/14/2012, 01/10/2023   Zoster Recombinant(Shingrix) 04/10/2018, 10/20/2018     Objective: Vital Signs: BP 122/83 (BP Location: Left Arm, Patient Position: Sitting, Cuff Size: Normal)   Pulse 70   Resp 15   Ht 5' 2.5" (1.588 m)   Wt 150 lb (68 kg)   BMI 27.00 kg/m    Physical Exam Vitals and nursing note reviewed.  Constitutional:      Appearance: She is well-developed.  HENT:     Head: Normocephalic and atraumatic.  Eyes:     Conjunctiva/sclera: Conjunctivae normal.  Cardiovascular:     Rate and Rhythm: Normal rate and regular rhythm.     Heart sounds: Normal heart sounds.  Pulmonary:     Effort: Pulmonary effort is normal.     Breath sounds: Normal breath sounds.  Abdominal:     General: Bowel sounds are normal.     Palpations: Abdomen is  soft.  Musculoskeletal:     Cervical back: Normal range of motion.  Lymphadenopathy:     Cervical: No cervical adenopathy.  Skin:    General: Skin is warm and dry.     Capillary Refill: Capillary refill takes less than 2 seconds.  Neurological:     Mental Status: She is alert and oriented to person, place, and time.  Psychiatric:        Behavior: Behavior normal.      Musculoskeletal Exam: C-spine, thoracic spine, lumbar spine have good range of motion.  Shoulder joints have full range of motion with discomfort bilaterally, right greater than left.  Elbow joints, wrist joints, MCPs, PIPs, DIPs have good range of motion with no synovitis.  Complete fist formation bilaterally.  Hip joints have good range of motion with no groin pain.  Left knee replacement has good range  of motion.  Right knee joint has good range of motion no warmth or effusion.  Ankle joints have good range of motion with no tenderness or joint swelling.  Overcrowding of toes noted.  No tenderness or synovitis over MTP joints.  CDAI Exam: CDAI Score: -- Patient Global: --; Provider Global: -- Swollen: 0 ; Tender: 3  Joint Exam 09/05/2023      Right  Left  Glenohumeral   Tender   Tender  MTP 1   Tender        Investigation: No additional findings.  Imaging: No results found.  Recent Labs: Lab Results  Component Value Date   WBC 6.7 07/04/2023   HGB 15.1 07/04/2023   PLT 260 07/04/2023   NA 138 07/04/2023   K 4.0 07/04/2023   CL 101 07/04/2023   CO2 25 07/04/2023   GLUCOSE 115 07/04/2023   BUN 14 07/04/2023   CREATININE 1.15 (H) 07/04/2023   BILITOT 0.5 07/04/2023   ALKPHOS 82 01/10/2023   AST 26 07/04/2023   ALT 27 07/04/2023   PROT 7.3 07/04/2023   ALBUMIN 4.4 01/10/2023   CALCIUM 9.8 07/04/2023   GFRAA 79 03/11/2021   QFTBGOLDPLUS NEGATIVE 01/14/2023    Speciality Comments: MTX-evaded creatinine, leflunomide-nausea and abdominal discomfort, Humira - started September 2022  Procedures:   No procedures performed Allergies: Arava [leflunomide], Hydrocodone-acetaminophen, and Tramadol   Assessment / Plan:     Visit Diagnoses: Seronegative rheumatoid arthritis (HCC) - H/o recurrent inflammatory arthritis since February 2022, Positive synovitis, RF-, anti-CCP negative, 14-3-3 eta negative, h/o of episcleritis: She has no synovitis on examination today.  She presents today with ongoing pain involving both shoulders, right greater than left.  She has good range of motion of both shoulder joints on examination today but has been experiencing nocturnal pain involving both shoulders.  Different treatment options were discussed but she is hesitant to proceed with a cortisone injection at this time.  Patient plans on continuing home exercises and will notify us if she would like a referral to physical therapy.  X-rays of both shoulders were obtained today for further evaluation.  Overall her rheumatoid arthritis remains well-controlled on Humira 40 mg sq injections every 14 days.  No medication changes will be made at this time.  She was advised to notify us if she develops signs or symptoms of a flare.  High risk medication use - Humira 40 mg sq injections once every 14 days. CBC and CMP updated on 07/04/23.  Her next lab work will be due in January and every 3 months.  Standing orders for CBC and CMP remain in place. TB gold negative on 01/14/23.  No recent or recurrent infections.  Discussed the importance of holding humira if she develops signs or symptoms of an infection and to resume once the infection has completely cleared.   She received annual flu shot and is planning to receive the COVID-vaccine in January 2025.  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  No recurrence of symptoms since initiating Humira.  Chronic pain of both shoulders -Patient presents today with ongoing pain involving both shoulders.  Her symptoms started about 1 year ago and have been most severe at night.   At times the pain in the right shoulder can be a 7/10 at night. X-rays of both shoulders were updated today for further evaluation.   Different treatment options were discussed in detail.  She would like to put off a cortisone injection at this time.  Offered referral to physical therapy but she would like to first try home exercises.  She will notify us if her symptoms persist or worsen.  Plan: XR Shoulder Left, XR Shoulder Right  Primary osteoarthritis of both hands: PIP and DIP thickening.  No inflammation noted.  Dupuytren's contracture of right hand - Unchanged  Status post left partial knee replacement - Dr. Sherlean Foot 11/25/21.  Chronic pain.  No effusion noted today.  Primary osteoarthritis of both feet: Overcrowding of toes noted.  No synovitis of MTP joints noted.  Both ankle joints have good range of motion with no tenderness or inflammation.  Discussed the importance of wearing proper fitting shoes with a wide toebox.  Idiopathic scoliosis of thoracolumbar region: No midline spinal tenderness.   History of osteoporosis - Tx IV Reclast (started September 2021) by [facility].  Past tx Fosamax, Actonel and Evista. DEXA updated on 11/22/2019: AP spine T-score -3.6. IV Reclast in 2021, 2022. Scheduled to receive IV reclast on Friday.    Vitamin D deficiency   Other medical conditions are listed as follows:   Depression with anxiety  Dyslipidemia  Environmental and seasonal allergies  Nodule of lower lobe of lung  Orders: Orders Placed This Encounter  Procedures   XR Shoulder Left   XR Shoulder Right   No orders of the defined types were placed in this encounter.     Follow-Up Instructions: Return in about 5 months (around 02/03/2024) for Rheumatoid arthritis.   Gearldine Bienenstock, PA-C  Note - This record has been created using Dragon software.  Chart creation errors have been sought, but may not always  have been located. Such creation errors do not reflect on  the standard of  medical care.

## 2023-08-29 ENCOUNTER — Other Ambulatory Visit: Payer: Self-pay

## 2023-09-05 ENCOUNTER — Ambulatory Visit (INDEPENDENT_AMBULATORY_CARE_PROVIDER_SITE_OTHER): Payer: BLUE CROSS/BLUE SHIELD

## 2023-09-05 ENCOUNTER — Ambulatory Visit: Payer: BLUE CROSS/BLUE SHIELD

## 2023-09-05 ENCOUNTER — Ambulatory Visit: Payer: BLUE CROSS/BLUE SHIELD | Attending: Physician Assistant | Admitting: Physician Assistant

## 2023-09-05 ENCOUNTER — Encounter: Payer: Self-pay | Admitting: Physician Assistant

## 2023-09-05 ENCOUNTER — Encounter: Payer: Self-pay | Admitting: Family Medicine

## 2023-09-05 VITALS — BP 122/83 | HR 70 | Resp 15 | Ht 62.5 in | Wt 150.0 lb

## 2023-09-05 DIAGNOSIS — M25512 Pain in left shoulder: Secondary | ICD-10-CM

## 2023-09-05 DIAGNOSIS — E785 Hyperlipidemia, unspecified: Secondary | ICD-10-CM

## 2023-09-05 DIAGNOSIS — M0600 Rheumatoid arthritis without rheumatoid factor, unspecified site: Secondary | ICD-10-CM

## 2023-09-05 DIAGNOSIS — G8929 Other chronic pain: Secondary | ICD-10-CM | POA: Diagnosis not present

## 2023-09-05 DIAGNOSIS — M19041 Primary osteoarthritis, right hand: Secondary | ICD-10-CM

## 2023-09-05 DIAGNOSIS — M25511 Pain in right shoulder: Secondary | ICD-10-CM | POA: Diagnosis not present

## 2023-09-05 DIAGNOSIS — H15103 Unspecified episcleritis, bilateral: Secondary | ICD-10-CM

## 2023-09-05 DIAGNOSIS — E559 Vitamin D deficiency, unspecified: Secondary | ICD-10-CM

## 2023-09-05 DIAGNOSIS — R911 Solitary pulmonary nodule: Secondary | ICD-10-CM

## 2023-09-05 DIAGNOSIS — M19071 Primary osteoarthritis, right ankle and foot: Secondary | ICD-10-CM

## 2023-09-05 DIAGNOSIS — Z79899 Other long term (current) drug therapy: Secondary | ICD-10-CM

## 2023-09-05 DIAGNOSIS — Z8739 Personal history of other diseases of the musculoskeletal system and connective tissue: Secondary | ICD-10-CM

## 2023-09-05 DIAGNOSIS — F418 Other specified anxiety disorders: Secondary | ICD-10-CM

## 2023-09-05 DIAGNOSIS — M720 Palmar fascial fibromatosis [Dupuytren]: Secondary | ICD-10-CM

## 2023-09-05 DIAGNOSIS — J3089 Other allergic rhinitis: Secondary | ICD-10-CM

## 2023-09-05 DIAGNOSIS — M4125 Other idiopathic scoliosis, thoracolumbar region: Secondary | ICD-10-CM

## 2023-09-05 DIAGNOSIS — M19072 Primary osteoarthritis, left ankle and foot: Secondary | ICD-10-CM

## 2023-09-05 DIAGNOSIS — Z96652 Presence of left artificial knee joint: Secondary | ICD-10-CM

## 2023-09-05 DIAGNOSIS — M19042 Primary osteoarthritis, left hand: Secondary | ICD-10-CM

## 2023-09-05 MED ORDER — TRAZODONE HCL 50 MG PO TABS: 50.0000 mg | ORAL_TABLET | Freq: Every day | ORAL | 0 refills | Status: DC

## 2023-09-05 NOTE — Telephone Encounter (Signed)
She is already at the maximum dose for Lorazepam. Let's add Trazodone 50 mg at bedtime to it. Call in #[phone] and report back to [place]

## 2023-09-05 NOTE — Patient Instructions (Addendum)
Biotene products for dry mouth    Standing Labs We placed an order today for your standing lab work.   Please have your standing labs drawn in January and every 3 months   Please have your labs drawn 2 weeks prior to your appointment so that the provider can discuss your lab results at your appointment, if possible.  Please note that you may see your imaging and lab results in MyChart before we have reviewed them. We will contact you once all results are reviewed. Please allow our office up to 72 hours to thoroughly review all of the results before contacting the office for clarification of your results.  WALK-IN LAB HOURS  Monday through Thursday from 8:00 am -12:30 pm and 1:00 pm-5:00 pm and Friday from 8:00 am-12:00 pm.  Patients with office visits requiring labs will be seen before walk-in labs.  You may encounter longer than normal wait times. Please allow additional time. Wait times may be shorter on  Monday and Thursday afternoons.  We do not book appointments for walk-in labs. We appreciate your patience and understanding with our staff.   Labs are drawn by [facility]. Please bring your co-pay at the time of your lab draw.  You may receive a bill from [facility] for your lab work.  Please note if you are on Hydroxychloroquine and and an order has been placed for a Hydroxychloroquine level,  you will need to have it drawn 4 hours or more after your last dose.  If you wish to have your labs drawn at another location, please call the office 24 hours in advance so we can fax the orders.  The office is located at [address], [address], [place], [place] [address]   If you have any questions regarding directions or hours of operation,  please call [phone].   As a reminder, please drink plenty of water prior to coming for your lab work. Thanks!

## 2023-09-06 NOTE — Progress Notes (Signed)
X-rays of both shoulders are unremarkable. Please notify the patient.  Please advise the patient to notify us if she would like a referral to PT

## 2023-09-07 ENCOUNTER — Encounter: Payer: Self-pay | Admitting: Rheumatology

## 2023-09-07 ENCOUNTER — Encounter: Payer: Self-pay | Admitting: Family Medicine

## 2023-09-07 DIAGNOSIS — Z8739 Personal history of other diseases of the musculoskeletal system and connective tissue: Secondary | ICD-10-CM

## 2023-09-07 DIAGNOSIS — E559 Vitamin D deficiency, unspecified: Secondary | ICD-10-CM

## 2023-09-08 ENCOUNTER — Other Ambulatory Visit: Payer: Self-pay

## 2023-09-08 NOTE — Telephone Encounter (Signed)
Vitamin D has not been checked recently.  Would she like to have vitamin D level updated to determine an adequate maintenance dose of vitamin D?

## 2023-09-08 NOTE — Progress Notes (Signed)
Specialty Pharmacy Refill Coordination Note  Sarah Ford is a 62 y.o. female contacted today regarding refills of specialty medication(s) Adalimumab (Humira (2 Pen))   Patient requested Delivery   Delivery date: 09/16/23   Verified address: [address]   [place] [address]   Medication will be filled on 09/15/23.

## 2023-09-08 NOTE — Telephone Encounter (Signed)
Noted  

## 2023-09-22 ENCOUNTER — Other Ambulatory Visit: Payer: Self-pay

## 2023-09-22 ENCOUNTER — Other Ambulatory Visit (HOSPITAL_COMMUNITY): Payer: Self-pay

## 2023-09-23 ENCOUNTER — Other Ambulatory Visit: Payer: Self-pay

## 2023-09-26 ENCOUNTER — Other Ambulatory Visit: Payer: Self-pay

## 2023-10-03 ENCOUNTER — Other Ambulatory Visit: Payer: Self-pay | Admitting: *Deleted

## 2023-10-04 ENCOUNTER — Other Ambulatory Visit: Payer: Self-pay | Admitting: Family Medicine

## 2023-10-07 ENCOUNTER — Other Ambulatory Visit: Payer: Self-pay

## 2023-10-07 MED ORDER — TRAZODONE HCL 50 MG PO TABS: 50.0000 mg | ORAL_TABLET | Freq: Every day | ORAL | 1 refills | Status: DC

## 2023-10-11 ENCOUNTER — Other Ambulatory Visit: Payer: Self-pay | Admitting: *Deleted

## 2023-10-11 DIAGNOSIS — Z79899 Other long term (current) drug therapy: Secondary | ICD-10-CM

## 2023-10-11 DIAGNOSIS — Z8739 Personal history of other diseases of the musculoskeletal system and connective tissue: Secondary | ICD-10-CM

## 2023-10-11 DIAGNOSIS — E559 Vitamin D deficiency, unspecified: Secondary | ICD-10-CM

## 2023-10-13 ENCOUNTER — Other Ambulatory Visit: Payer: Self-pay

## 2023-10-13 ENCOUNTER — Encounter: Payer: Self-pay | Admitting: Family Medicine

## 2023-10-13 ENCOUNTER — Ambulatory Visit: Payer: BLUE CROSS/BLUE SHIELD | Admitting: Family Medicine

## 2023-10-13 VITALS — BP 110/70 | HR 71 | Temp 99.1°F | Wt 152.6 lb

## 2023-10-13 DIAGNOSIS — E039 Hypothyroidism, unspecified: Secondary | ICD-10-CM

## 2023-10-13 LAB — COMPLETE METABOLIC PANEL WITH GFR
AG Ratio: 1.5 (calc) (ref 1.0–2.5)
ALT: 26 U/L (ref 6–29)
AST: 25 U/L (ref 10–35)
Albumin: 4.1 g/dL (ref 3.6–5.1)
Alkaline phosphatase (APISO): 81 U/L (ref 37–153)
BUN: 11 mg/dL (ref 7–25)
CO2: 26 mmol/L (ref 20–32)
Calcium: 8.7 mg/dL (ref 8.6–10.4)
Chloride: 100 mmol/L (ref 98–110)
Creat: 0.99 mg/dL (ref 0.50–1.05)
Globulin: 2.8 g/dL (ref 1.9–3.7)
Glucose, Bld: 78 mg/dL (ref 65–139)
Potassium: 4.2 mmol/L (ref 3.5–5.3)
Sodium: 134 mmol/L — ABNORMAL LOW (ref 135–146)
Total Bilirubin: 0.4 mg/dL (ref 0.2–1.2)
Total Protein: 6.9 g/dL (ref 6.1–8.1)
eGFR: 65 mL/min/{1.73_m2} (ref >=60)

## 2023-10-13 LAB — VITAMIN D 25 HYDROXY (VIT D DEFICIENCY, FRACTURES): Vit D, 25-Hydroxy: 40 ng/mL (ref 30–100)

## 2023-10-13 LAB — CBC WITH DIFFERENTIAL/PLATELET
Absolute Lymphocytes: 1824 {cells}/uL (ref 850–3900)
Absolute Monocytes: 546 {cells}/uL (ref 200–950)
Basophils Absolute: 42 {cells}/uL (ref 0–200)
Basophils Relative: 0.7 %
Eosinophils Absolute: 174 {cells}/uL (ref 15–500)
Eosinophils Relative: 2.9 %
HCT: 41.6 % (ref 35.0–45.0)
Hemoglobin: 14 g/dL (ref 11.7–15.5)
MCH: 29.8 pg (ref 27.0–33.0)
MCHC: 33.7 g/dL (ref 32.0–36.0)
MCV: 88.5 fL (ref 80.0–100.0)
MPV: 10.8 fL (ref 7.5–12.5)
Monocytes Relative: 9.1 %
Neutro Abs: 3414 {cells}/uL (ref 1500–7800)
Neutrophils Relative %: 56.9 %
Platelets: 237 10*3/uL (ref 140–400)
RBC: 4.7 10*6/uL (ref 3.80–5.10)
RDW: 11.8 % (ref 11.0–15.0)
Total Lymphocyte: 30.4 %
WBC: 6 10*3/uL (ref 3.8–10.8)

## 2023-10-13 LAB — T3, FREE: T3, Free: 2.6 pg/mL (ref 2.3–4.2)

## 2023-10-13 LAB — TSH: TSH: 0.6 u[IU]/mL (ref 0.35–5.50)

## 2023-10-13 LAB — T4, FREE: Free T4: 0.97 ng/dL (ref 0.60–1.60)

## 2023-10-13 NOTE — Progress Notes (Signed)
   Subjective:    Patient ID: Sarah Ford, female    DOB: 08/24/1961, 63 y.o.   MRN: [id number]  HPI Here to follow up on hypothyroidism. This was discovered last October, and she was started on Levothyroxine. Since then she feels much better with a lot more energy. Her weight and BP remain stable.    Review of Systems  Constitutional: Negative.   Respiratory: Negative.    Cardiovascular: Negative.        Objective:   Physical Exam Constitutional:      Appearance: Normal appearance.  Cardiovascular:     Rate and Rhythm: Normal rate and regular rhythm.     Pulses: Normal pulses.     Heart sounds: Normal heart sounds.  Pulmonary:     Effort: Pulmonary effort is normal.     Breath sounds: Normal breath sounds.  Neurological:     Mental Status: She is alert.           Assessment & Plan:  Hypothyroidism. We will check a thyroid panel today.  Gershon Crane, MD

## 2023-10-13 NOTE — Progress Notes (Signed)
Specialty Pharmacy Refill Coordination Note  Sarah Ford is a 63 y.o. female contacted today regarding refills of specialty medication(s) Adalimumab (Humira (2 Pen))   Patient requested Delivery   Delivery date: 10/27/23   Verified address: [address]   [place] [address]   Medication will be filled on 10/26/23.

## 2023-10-13 NOTE — Progress Notes (Signed)
Renal function has improved--GFR improved from 55 to 65. Sodium is borderline low--rest of CMP WNL.  We will continue to monitor.   CBC WNL Vitamin D WNL

## 2023-10-17 ENCOUNTER — Other Ambulatory Visit (HOSPITAL_COMMUNITY): Payer: Self-pay

## 2023-10-26 ENCOUNTER — Other Ambulatory Visit (HOSPITAL_COMMUNITY): Payer: Self-pay

## 2023-10-26 ENCOUNTER — Other Ambulatory Visit: Payer: Self-pay | Admitting: Rheumatology

## 2023-10-26 ENCOUNTER — Other Ambulatory Visit: Payer: Self-pay

## 2023-10-26 DIAGNOSIS — M0600 Rheumatoid arthritis without rheumatoid factor, unspecified site: Secondary | ICD-10-CM

## 2023-10-26 MED ORDER — HUMIRA (2 PEN) 40 MG/0.4ML ~~LOC~~ AJKT
40.0000 mg | AUTO-INJECTOR | SUBCUTANEOUS | 0 refills | Status: DC
  Filled 2023-10-26: qty 6, 84d supply, fill #0
  Filled 2023-11-18: qty 2, 28d supply, fill #0
  Filled 2023-12-13: qty 2, 28d supply, fill #1
  Filled 2023-12-22 – 2024-01-11 (×3): qty 2, 28d supply, fill #2

## 2023-10-26 NOTE — Telephone Encounter (Signed)
 Last Fill: 08/11/2023  Labs: 10/12/2023 Renal function has improved--GFR improved from 55 to 65. Sodium is borderline low--rest of CMP WNL. CBC WNL   TB Gold: 01/14/2023 Neg    Next Visit: 03/06/2024  Last Visit: 09/05/2023  IK:[id number] rheumatoid arthritis   Current Dose per office note 09/05/2023: Humira  40 mg sq injections once every 14 days.   Okay to refill Humira ?

## 2023-11-14 ENCOUNTER — Encounter: Payer: Self-pay | Admitting: Family Medicine

## 2023-11-14 NOTE — Telephone Encounter (Signed)
 Tell her to take 2 tablets of Trazodone (100 mg) at bedtime. If this does not help, we can keep increasing the dose

## 2023-11-15 MED ORDER — METHOCARBAMOL 500 MG PO TABS: 500.0000 mg | ORAL_TABLET | Freq: Four times a day (QID) | ORAL | 2 refills | Status: DC | PRN

## 2023-11-15 NOTE — Telephone Encounter (Signed)
 Done

## 2023-11-18 ENCOUNTER — Other Ambulatory Visit: Payer: Self-pay

## 2023-11-18 ENCOUNTER — Other Ambulatory Visit (HOSPITAL_COMMUNITY): Payer: Self-pay

## 2023-11-18 NOTE — Progress Notes (Signed)
 Specialty Pharmacy Refill Coordination Note  Sarah Ford is a 63 y.o. female contacted today regarding refills of specialty medication(s) Adalimumab (Humira (2 Pen))   Patient requested Delivery   Delivery date: 11/23/23   Verified address: [address]   [place] [address]   Medication will be filled on 11/22/23.

## 2023-11-22 ENCOUNTER — Other Ambulatory Visit: Payer: Self-pay

## 2023-11-29 ENCOUNTER — Other Ambulatory Visit: Payer: Self-pay | Admitting: Family Medicine

## 2023-11-29 ENCOUNTER — Encounter: Payer: Self-pay | Admitting: Family Medicine

## 2023-12-13 ENCOUNTER — Other Ambulatory Visit (HOSPITAL_COMMUNITY): Payer: Self-pay

## 2023-12-13 NOTE — Progress Notes (Signed)
 Specialty Pharmacy Ongoing Clinical Assessment Note  Sarah Ford is a 63 y.o. female who is being followed by the specialty pharmacy service for RxSp Rheumatoid Arthritis   Patient's specialty medication(s) reviewed today: Adalimumab (Humira (2 Pen))   Missed doses in the last 4 weeks: 0   Patient/Caregiver did not have any additional questions or concerns.   Therapeutic benefit summary: Patient is achieving benefit   Adverse events/side effects summary: No adverse events/side effects   Patient's therapy is appropriate to: Continue    Goals Addressed             This Visit's Progress    Reduce signs and symptoms   On track    Patient is on track. Patient will maintain adherence         Follow up:  6 months  Servando Snare Specialty Pharmacist

## 2023-12-13 NOTE — Progress Notes (Signed)
 Specialty Pharmacy Refill Coordination Note  Linsy Ehresman is a 63 y.o. female contacted today regarding refills of specialty medication(s) Adalimumab (Humira (2 Pen))   Patient requested (Patient-Rptd) Delivery   Delivery date: (Patient-Rptd) 12/24/23   Verified address: (Patient-Rptd) [address] [address]   Medication will be filled on 12/22/23. Will be delivered on 12/23/23; pt was informed of updated delivery date.

## 2023-12-22 ENCOUNTER — Other Ambulatory Visit: Payer: Self-pay

## 2023-12-22 ENCOUNTER — Other Ambulatory Visit (HOSPITAL_COMMUNITY): Payer: Self-pay

## 2024-01-01 ENCOUNTER — Other Ambulatory Visit: Payer: Self-pay | Admitting: Family Medicine

## 2024-01-04 ENCOUNTER — Other Ambulatory Visit: Payer: Self-pay | Admitting: Family Medicine

## 2024-01-04 ENCOUNTER — Encounter: Payer: Self-pay | Admitting: Family Medicine

## 2024-01-05 ENCOUNTER — Other Ambulatory Visit: Payer: Self-pay | Admitting: Family Medicine

## 2024-01-05 NOTE — Telephone Encounter (Deleted)
 Copied from CRM [id number]. Topic: Clinical - Medication Refill >> Jan 05, 2024 10:36 AM Adonis Hoot wrote: Most Recent Primary Care Visit:  Provider: Corita Diego A  Department: [facility]  Visit Type: OFFICE VISIT  Date: 10/13/2023  Medication: LORazepam (ATIVAN) 2 MG tablet  Has the patient contacted their pharmacy? No (Agent: If no, request that the patient contact the pharmacy for the refill. If patient does not wish to contact the pharmacy document the reason why and proceed with request.) (Agent: If yes, when and what did the pharmacy advise?)  Is this the correct pharmacy for this prescription? Yes If no, delete pharmacy and type the correct one.  This is the patient's preferred pharmacy:  [facility] [id number] - [place], [place] - [address] [place] [address] Phone: [phone] Fax: [phone]    Has the prescription been filled recently? Yes  Is the patient out of the medication? Yes  Has the patient been seen for an appointment in the last year OR does the patient have an upcoming appointment? Yes  Can we respond through MyChart? Yes  Agent: Please be advised that Rx refills may take up to 3 business days. We ask that you follow-up with your pharmacy.

## 2024-01-06 ENCOUNTER — Other Ambulatory Visit: Payer: Self-pay | Admitting: Family Medicine

## 2024-01-06 NOTE — Telephone Encounter (Signed)
 Copied from CRM [id number]. Topic: Clinical - Medication Refill >> Jan 06, 2024  9:59 AM Keitha Pata L wrote: Most Recent Primary Care Visit:  Provider: Corita Diego A  Department: [facility]  Visit Type: OFFICE VISIT  Date: 10/13/2023  Medication:LORazepam  (ATIVAN ) 2 MG tablet  Has the patient contacted their pharmacy? Yes (Agent: If no, request that the patient contact the pharmacy for the refill. If patient does not wish to contact the pharmacy document the reason why and proceed with request.) (Agent: If yes, when and what did the pharmacy advise?)  Is this the correct pharmacy for this prescription? Yes If no, delete pharmacy and type the correct one.  This is the patient's preferred pharmacy:  [facility] [id number] - [place], [place] - [address] [place] [address] Phone: [phone] Fax: [phone]  [facility] [address] [place], [place] - [address] [place] [address] Phone: [phone] Fax: [phone]  [facility] Sp[facility] [address] [place] [address] Phone: [phone] Fax: [phone]   Has the prescription been filled recently? Yes  Is the patient out of the medication? Yes  Has the patient been seen for an appointment in the last year OR does the patient have an upcoming appointment? Yes  Can we respond through MyChart? Yes  Agent: Please be advised that Rx refills may take up to 3 business days. We ask that you follow-up with your pharmacy.

## 2024-01-09 ENCOUNTER — Encounter: Payer: Self-pay | Admitting: Family Medicine

## 2024-01-10 ENCOUNTER — Other Ambulatory Visit: Payer: Self-pay | Admitting: Family Medicine

## 2024-01-10 NOTE — Telephone Encounter (Signed)
 Pt LOV was on 10/13/23 Last refill done on 06/14/23 Please advise

## 2024-01-10 NOTE — Telephone Encounter (Signed)
Message sent to PCP for approval 

## 2024-01-11 ENCOUNTER — Other Ambulatory Visit: Payer: Self-pay

## 2024-01-11 ENCOUNTER — Other Ambulatory Visit (HOSPITAL_COMMUNITY): Payer: Self-pay

## 2024-01-11 NOTE — Progress Notes (Signed)
 Specialty Pharmacy Refill Coordination Note  Sarah Ford is a 63 y.o. female contacted today regarding refills of specialty medication(s) Humira .  Patient requested (Patient-Rptd) Delivery   Delivery date: (Patient-Rptd) 01/18/24   Verified address: (Patient-Rptd) [address], [place] [address]   Medication will be filled on 01/12/24. New delivery date is 01/13/24. Patient has been notified.

## 2024-01-12 ENCOUNTER — Other Ambulatory Visit: Payer: Self-pay

## 2024-01-13 NOTE — Telephone Encounter (Signed)
 This was filled on 01/10/24

## 2024-01-24 ENCOUNTER — Other Ambulatory Visit: Payer: Self-pay | Admitting: Family Medicine

## 2024-02-02 ENCOUNTER — Other Ambulatory Visit: Payer: Self-pay | Admitting: Physician Assistant

## 2024-02-02 ENCOUNTER — Other Ambulatory Visit: Payer: Self-pay

## 2024-02-02 DIAGNOSIS — M0600 Rheumatoid arthritis without rheumatoid factor, unspecified site: Secondary | ICD-10-CM

## 2024-02-02 DIAGNOSIS — Z79899 Other long term (current) drug therapy: Secondary | ICD-10-CM

## 2024-02-02 DIAGNOSIS — Z9225 Personal history of immunosupression therapy: Secondary | ICD-10-CM

## 2024-02-02 DIAGNOSIS — Z111 Encounter for screening for respiratory tuberculosis: Secondary | ICD-10-CM

## 2024-02-02 MED ORDER — HUMIRA (2 PEN) 40 MG/0.4ML ~~LOC~~ AJKT
40.0000 mg | AUTO-INJECTOR | SUBCUTANEOUS | 0 refills | Status: DC
  Filled 2024-02-02: qty 2, 28d supply, fill #0

## 2024-02-02 NOTE — Telephone Encounter (Signed)
 Last Fill: 10/26/2023  Labs: 10/12/2023 Renal function has improved--GFR improved from 55 to 65. Sodium is borderline low--rest of CMP WNL. CBC WNL   TB Gold: 01/14/2023   Next Visit: 03/06/2024  Last Visit: 09/05/2023   [id number] rheumatoid arthritis   Current Dose per office note 09/05/2023: Humira  40 mg sq injections once every 14 days   Left message to advise patient she is due to update labs.   Okay to refill Humira ?

## 2024-02-02 NOTE — Progress Notes (Signed)
 Specialty Pharmacy Refill Coordination Note  Sarah Ford is a 63 y.o. female contacted today regarding refills of specialty medication(s) Adalimumab  (Humira  (2 Pen))   Patient requested (Patient-Rptd) Delivery   Delivery date: (Patient-Rptd) 02/15/24   Verified address: (Patient-Rptd) [address] [place], [place]. [address]   Medication will be filled on 05.27.25.   Refill request pending - notify patient if delayed.

## 2024-02-03 ENCOUNTER — Other Ambulatory Visit: Payer: Self-pay

## 2024-02-03 ENCOUNTER — Other Ambulatory Visit: Payer: Self-pay | Admitting: *Deleted

## 2024-02-03 ENCOUNTER — Encounter: Payer: Self-pay | Admitting: Rheumatology

## 2024-02-03 DIAGNOSIS — Z79899 Other long term (current) drug therapy: Secondary | ICD-10-CM

## 2024-02-03 DIAGNOSIS — Z111 Encounter for screening for respiratory tuberculosis: Secondary | ICD-10-CM

## 2024-02-03 DIAGNOSIS — Z9225 Personal history of immunosupression therapy: Secondary | ICD-10-CM

## 2024-02-07 ENCOUNTER — Ambulatory Visit: Payer: Self-pay | Admitting: Physician Assistant

## 2024-02-07 NOTE — Progress Notes (Signed)
 ALT is borderline elevated-please clarify if she has been taking any tylenol or alcohol use.  Rest of CMP WNL.  CBC WNL.

## 2024-02-08 LAB — COMPREHENSIVE METABOLIC PANEL WITH GFR
AG Ratio: 1.8 (calc) (ref 1.0–2.5)
ALT: 33 U/L — ABNORMAL HIGH (ref 6–29)
AST: 23 U/L (ref 10–35)
Albumin: 4.3 g/dL (ref 3.6–5.1)
Alkaline phosphatase (APISO): 76 U/L (ref 37–153)
BUN: 15 mg/dL (ref 7–25)
CO2: 27 mmol/L (ref 20–32)
Calcium: 9.1 mg/dL (ref 8.6–10.4)
Chloride: 102 mmol/L (ref 98–110)
Creat: 1.05 mg/dL (ref 0.50–1.05)
Globulin: 2.4 g/dL (ref 1.9–3.7)
Glucose, Bld: 79 mg/dL (ref 65–139)
Potassium: 4.6 mmol/L (ref 3.5–5.3)
Sodium: 137 mmol/L (ref 135–146)
Total Bilirubin: 0.4 mg/dL (ref 0.2–1.2)
Total Protein: 6.7 g/dL (ref 6.1–8.1)
eGFR: 61 mL/min/{1.73_m2} (ref >=60)

## 2024-02-08 LAB — CBC WITH DIFFERENTIAL/PLATELET
Absolute Lymphocytes: 2698 {cells}/uL (ref 850–3900)
Absolute Monocytes: 631 {cells}/uL (ref 200–950)
Basophils Absolute: 42 {cells}/uL (ref 0–200)
Basophils Relative: 0.5 %
Eosinophils Absolute: 66 {cells}/uL (ref 15–500)
Eosinophils Relative: 0.8 %
HCT: 43.4 % (ref 35.0–45.0)
Hemoglobin: 14.2 g/dL (ref 11.7–15.5)
MCH: 30.1 pg (ref 27.0–33.0)
MCHC: 32.7 g/dL (ref 32.0–36.0)
MCV: 91.9 fL (ref 80.0–100.0)
MPV: 10.1 fL (ref 7.5–12.5)
Monocytes Relative: 7.6 %
Neutro Abs: 4864 {cells}/uL (ref 1500–7800)
Neutrophils Relative %: 58.6 %
Platelets: 288 10*3/uL (ref 140–400)
RBC: 4.72 10*6/uL (ref 3.80–5.10)
RDW: 12.7 % (ref 11.0–15.0)
Total Lymphocyte: 32.5 %
WBC: 8.3 10*3/uL (ref 3.8–10.8)

## 2024-02-08 LAB — QUANTIFERON-TB GOLD PLUS
Mitogen-NIL: >10 [IU]/mL
NIL: 0.01 [IU]/mL
QuantiFERON-TB Gold Plus: NEGATIVE
TB1-NIL: 0 [IU]/mL
TB2-NIL: 0 [IU]/mL

## 2024-02-08 NOTE — Progress Notes (Signed)
 TB gold negative

## 2024-02-14 ENCOUNTER — Other Ambulatory Visit: Payer: Self-pay

## 2024-02-14 ENCOUNTER — Other Ambulatory Visit: Payer: Self-pay | Admitting: Physician Assistant

## 2024-02-14 DIAGNOSIS — M0600 Rheumatoid arthritis without rheumatoid factor, unspecified site: Secondary | ICD-10-CM

## 2024-02-19 HISTORY — PX: PRE-MALIGNANT / BENIGN SKIN LESION EXCISION: SHX160

## 2024-02-22 NOTE — Progress Notes (Deleted)
 Office Visit Note  Patient: Sarah Ford             Date of Birth: Apr 15, 1961           MRN: [id number]             PCP: Donley Furth, MD Referring: Donley Furth, MD Visit Date: 03/06/2024 Occupation: @GUAROCC @  Subjective:  No chief complaint on file.   History of Present Illness: Sarah Ford is a 63 y.o. female ***     Activities of Daily Living:  Patient reports morning stiffness for *** {minute/hour:19697}.   Patient {ACTIONS;DENIES/REPORTS:21021675::"Denies"} nocturnal pain.  Difficulty dressing/grooming: {ACTIONS;DENIES/REPORTS:21021675::"Denies"} Difficulty climbing stairs: {ACTIONS;DENIES/REPORTS:21021675::"Denies"} Difficulty getting out of chair: {ACTIONS;DENIES/REPORTS:21021675::"Denies"} Difficulty using hands for taps, buttons, cutlery, and/or writing: {ACTIONS;DENIES/REPORTS:21021675::"Denies"}  No Rheumatology ROS completed.   PMFS History:  Patient Active Problem List   Diagnosis Date Noted  . Hypothyroidism 10/13/2023  . Seronegative rheumatoid arthritis (HCC) 06/26/2021  . Pulmonary nodules 03/05/2021  . Arthralgia 12/10/2020  . Peripheral edema 12/10/2020  . Chronic right-sided low back pain with right-sided sciatica 01/25/2020  . Constipation 10/24/2019  . Environmental and seasonal allergies 10/24/2019  . Migraine variant 03/09/2010  . APHASIA 03/09/2010  . INSOMNIA, CHRONIC 05/29/2007  . Depression with anxiety 05/29/2007  . Osteoporosis 05/29/2007    Past Medical History:  Diagnosis Date  . Allergy   . Arthritis   . Constipation   . Depression   . IBS (irritable bowel syndrome)    sees Dr. Andriette Keeling   . Insomnia   . Osteoporosis    gets Reclast  per [facility]   . Routine gynecological examination    sees Dr. Asencion Blacksmith   . Seronegative rheumatoid arthritis (HCC)    sees Dr. Alvira Josephs    Family History  Problem Relation Age of Onset  . Cancer Mother   . Breast cancer Mother   . Thyroid  disease Mother   . Hypertension Mother    . High Cholesterol Mother   . Myelodysplastic syndrome Maternal Aunt   . Heart failure Father   . Kidney failure Father   . Diabetes Father   . Hypertension Father   . High Cholesterol Father   . Osteoporosis Sister   . Alcohol abuse Sister   . Mental illness Sister   . Healthy Son   . Allergies Daughter   . Depression Daughter    Past Surgical History:  Procedure Laterality Date  . COLONOSCOPY  08/28/2014   per Dr. Andriette Keeling, clear, repeat in 10 yrs   . DILATION AND CURETTAGE OF UTERUS     x2  . FOOT SURGERY Right    bone spur  . HERNIA REPAIR     umbilical  . MEDIAL PARTIAL KNEE REPLACEMENT Left 11/2021  . TEMPOROMANDIBULAR JOINT SURGERY  1993  . tummy tuck     dr Lindle Rhea   Social History   Social History Narrative  . Not on file   Immunization History  Administered Date(s) Administered  . Influenza Inj Mdck Quad With Preservative 06/12/2019  . Influenza Split 07/26/2011, 07/05/2012  . Influenza Whole 07/05/2007  . Influenza,inj,Quad PF,6+ Mos 07/26/2014, 05/30/2015, 06/21/2016, 07/10/2018, 06/08/2022  . Influenza-Unspecified 06/20/2017, 07/04/2020, 06/15/2021  . PFIZER(Purple Top)SARS-COV-2 Vaccination 11/30/2019, 12/22/2019, 07/17/2020, 02/09/2021  . PNEUMOCOCCAL CONJUGATE-20 02/19/2021  . Pfizer Covid-19 Vaccine Bivalent Booster 32yrs & up 07/19/2022  . Tdap 12/14/2012, 01/10/2023  . Zoster Recombinant(Shingrix) 04/10/2018, 10/20/2018     Objective: Vital Signs: There were no vitals taken for this visit.   Physical Exam  Musculoskeletal Exam: ***  CDAI Exam: CDAI Score: -- Patient Global: --; Provider Global: -- Swollen: --; Tender: -- Joint Exam 03/06/2024   No joint exam has been documented for this visit   There is currently no information documented on the homunculus. Go to the Rheumatology activity and complete the homunculus joint exam.  Investigation: No additional findings.  Imaging: No results found.  Recent Labs: Lab Results   Component Value Date   WBC 8.3 02/06/2024   HGB 14.2 02/06/2024   PLT 288 02/06/2024   NA 137 02/06/2024   K 4.6 02/06/2024   CL 102 02/06/2024   CO2 27 02/06/2024   GLUCOSE 79 02/06/2024   BUN 15 02/06/2024   CREATININE 1.05 02/06/2024   BILITOT 0.4 02/06/2024   ALKPHOS 82 01/10/2023   AST 23 02/06/2024   ALT 33 (H) 02/06/2024   PROT 6.7 02/06/2024   ALBUMIN 4.4 01/10/2023   CALCIUM  9.1 02/06/2024   GFRAA 79 03/11/2021   QFTBGOLDPLUS NEGATIVE 02/06/2024    Speciality Comments: MTX-evaded creatinine, leflunomide -nausea and abdominal discomfort, Humira  - started September 2022  Procedures:  No procedures performed Allergies: Arava  [leflunomide ], Hydrocodone-acetaminophen, and Tramadol   Assessment / Plan:     Visit Diagnoses: No diagnosis found.  Orders: No orders of the defined types were placed in this encounter.  No orders of the defined types were placed in this encounter.   Face-to-face time spent with patient was *** minutes. Greater than 50% of time was spent in counseling and coordination of care.  Follow-Up Instructions: No follow-ups on file.   Dee Farber, CMA  Note - This record has been created using Animal nutrition[employer].  Chart creation errors have been sought, but may not always  have been located. Such creation errors do not reflect on  the standard of medical care.

## 2024-03-05 ENCOUNTER — Encounter: Payer: Self-pay | Admitting: Family Medicine

## 2024-03-05 DIAGNOSIS — E039 Hypothyroidism, unspecified: Secondary | ICD-10-CM

## 2024-03-06 ENCOUNTER — Ambulatory Visit: Payer: BLUE CROSS/BLUE SHIELD | Admitting: Rheumatology

## 2024-03-06 DIAGNOSIS — Z8739 Personal history of other diseases of the musculoskeletal system and connective tissue: Secondary | ICD-10-CM

## 2024-03-06 DIAGNOSIS — Z96652 Presence of left artificial knee joint: Secondary | ICD-10-CM

## 2024-03-06 DIAGNOSIS — R911 Solitary pulmonary nodule: Secondary | ICD-10-CM

## 2024-03-06 DIAGNOSIS — E785 Hyperlipidemia, unspecified: Secondary | ICD-10-CM

## 2024-03-06 DIAGNOSIS — J3089 Other allergic rhinitis: Secondary | ICD-10-CM

## 2024-03-06 DIAGNOSIS — M720 Palmar fascial fibromatosis [Dupuytren]: Secondary | ICD-10-CM

## 2024-03-06 DIAGNOSIS — M0600 Rheumatoid arthritis without rheumatoid factor, unspecified site: Secondary | ICD-10-CM

## 2024-03-06 DIAGNOSIS — M19041 Primary osteoarthritis, right hand: Secondary | ICD-10-CM

## 2024-03-06 DIAGNOSIS — Z5181 Encounter for therapeutic drug level monitoring: Secondary | ICD-10-CM

## 2024-03-06 DIAGNOSIS — Z79899 Other long term (current) drug therapy: Secondary | ICD-10-CM

## 2024-03-06 DIAGNOSIS — F418 Other specified anxiety disorders: Secondary | ICD-10-CM

## 2024-03-06 DIAGNOSIS — H15103 Unspecified episcleritis, bilateral: Secondary | ICD-10-CM

## 2024-03-06 DIAGNOSIS — M4125 Other idiopathic scoliosis, thoracolumbar region: Secondary | ICD-10-CM

## 2024-03-06 DIAGNOSIS — G8929 Other chronic pain: Secondary | ICD-10-CM

## 2024-03-06 DIAGNOSIS — M19071 Primary osteoarthritis, right ankle and foot: Secondary | ICD-10-CM

## 2024-03-06 DIAGNOSIS — E559 Vitamin D deficiency, unspecified: Secondary | ICD-10-CM

## 2024-03-06 NOTE — Telephone Encounter (Signed)
 I put in the orders

## 2024-03-08 ENCOUNTER — Other Ambulatory Visit (INDEPENDENT_AMBULATORY_CARE_PROVIDER_SITE_OTHER)

## 2024-03-08 ENCOUNTER — Other Ambulatory Visit: Payer: Self-pay | Admitting: Physician Assistant

## 2024-03-08 DIAGNOSIS — M0600 Rheumatoid arthritis without rheumatoid factor, unspecified site: Secondary | ICD-10-CM

## 2024-03-08 DIAGNOSIS — E039 Hypothyroidism, unspecified: Secondary | ICD-10-CM | POA: Diagnosis not present

## 2024-03-08 MED ORDER — HUMIRA (2 PEN) 40 MG/0.4ML ~~LOC~~ AJKT
40.0000 mg | AUTO-INJECTOR | SUBCUTANEOUS | 2 refills | Status: DC
  Filled 2024-03-09 – 2024-03-19 (×2): qty 2, 28d supply, fill #0
  Filled 2024-04-20: qty 2, 28d supply, fill #1
  Filled 2024-05-15: qty 2, 28d supply, fill #2

## 2024-03-08 NOTE — Telephone Encounter (Signed)
 Last Fill: 02/02/2024 (30 day supply)   Labs: 02/06/2024 ALT is borderline elevated Rest of CMP WNL.  CBC WNL.    TB Gold: 01/14/2023    Next Visit: 03/06/2024   Last Visit: 09/05/2023    [id number] rheumatoid arthritis    Current Dose per office note 09/05/2023: Humira  40 mg sq injections once every 14 days    Okay to refill Humira ?

## 2024-03-08 NOTE — Telephone Encounter (Signed)
 Last Fill: 02/02/2024  Labs: 02/06/2024  ALT is borderline elevated-please clarify if she has been taking any tylenol or alcohol use.  Rest of CMP WNL.  CBC WNL.   TB Gold: 02/06/2024   Next Visit: 03/20/2024  Last Visit: 09/05/2023  [id number] rheumatoid arthritis   Current Dose per office note 09/05/2023: Humira  40 mg sq injections once every 14 days   Okay to refill Humira ?

## 2024-03-09 ENCOUNTER — Ambulatory Visit: Payer: Self-pay | Admitting: Family Medicine

## 2024-03-09 ENCOUNTER — Other Ambulatory Visit: Payer: Self-pay

## 2024-03-09 ENCOUNTER — Other Ambulatory Visit (HOSPITAL_COMMUNITY): Payer: Self-pay

## 2024-03-09 LAB — TSH: TSH: 0.64 u[IU]/mL (ref 0.35–5.50)

## 2024-03-09 LAB — T4, FREE: Free T4: 1.08 ng/dL (ref 0.60–1.60)

## 2024-03-09 LAB — T3, FREE: T3, Free: 2.5 pg/mL (ref 2.3–4.2)

## 2024-03-09 NOTE — Progress Notes (Deleted)
 Office Visit Note  Patient: Sarah Ford             Date of Birth: November 04, 1960           MRN: [id number]             PCP: Johnny Garnette LABOR, MD Referring: Johnny Garnette LABOR, MD Visit Date: 03/20/2024 Occupation: @GUAROCC @  Subjective:    History of Present Illness: Sarah Ford is a 63 y.o. female with history of seronegative rheumatoid arthritis and episcleritis.  Patient is currently on Humira  40 mg subcutaneous injections every 14 days.  CBC and CMP were drawn on 02/06/2024.  Her next lab work will be due in August and every 3 months to monitor for drug toxicity. TB Gold negative on 02/06/2024. Discussed the importance of holding Humira  if she develops signs or symptoms of an infection and to resume once the infection has completely cleared.  Activities of Daily Living:  Patient reports morning stiffness for *** {minute/hour:19697}.   Patient {ACTIONS;DENIES/REPORTS:21021675::Denies} nocturnal pain.  Difficulty dressing/grooming: {ACTIONS;DENIES/REPORTS:21021675::Denies} Difficulty climbing stairs: {ACTIONS;DENIES/REPORTS:21021675::Denies} Difficulty getting out of chair: {ACTIONS;DENIES/REPORTS:21021675::Denies} Difficulty using hands for taps, buttons, cutlery, and/or writing: {ACTIONS;DENIES/REPORTS:21021675::Denies}  No Rheumatology ROS completed.   PMFS History:  Patient Active Problem List   Diagnosis Date Noted   Hypothyroidism 10/13/2023   Seronegative rheumatoid arthritis (HCC) 06/26/2021   Pulmonary nodules 03/05/2021   Arthralgia 12/10/2020   Peripheral edema 12/10/2020   Chronic right-sided low back pain with right-sided sciatica 01/25/2020   Constipation 10/24/2019   Environmental and seasonal allergies 10/24/2019   Migraine variant 03/09/2010   APHASIA 03/09/2010   INSOMNIA, CHRONIC 05/29/2007   Depression with anxiety 05/29/2007   Osteoporosis 05/29/2007    Past Medical History:  Diagnosis Date   Allergy    Arthritis    Constipation     Depression    IBS (irritable bowel syndrome)    sees Dr. Luis    Insomnia    Osteoporosis    gets Reclast  per [facility]    Routine gynecological examination    sees Dr. Marget    Seronegative rheumatoid arthritis [facility])    sees Dr. Dolphus    Family History  Problem Relation Age of Onset   Cancer Mother    Breast cancer Mother    Thyroid  disease Mother    Hypertension Mother    High Cholesterol Mother    Myelodysplastic syndrome Maternal Aunt    Heart failure Father    Kidney failure Father    Diabetes Father    Hypertension Father    High Cholesterol Father    Osteoporosis Sister    Alcohol abuse Sister    Mental illness Sister    Healthy Son    Allergies Daughter    Depression Daughter    Past Surgical History:  Procedure Laterality Date   COLONOSCOPY  08/28/2014   per Dr. Luis, clear, repeat in 10 yrs    DILATION AND CURETTAGE OF UTERUS     x2   FOOT SURGERY Right    bone spur   HERNIA REPAIR     umbilical   MEDIAL PARTIAL KNEE REPLACEMENT Left 11/2021   TEMPOROMANDIBULAR JOINT SURGERY  1993   tummy tuck     dr marcus   Social History   Social History Narrative   Not on file   Immunization History  Administered Date(s) Administered   Influenza Inj Mdck Quad With Preservative 06/12/2019   Influenza Split 07/26/2011, 07/05/2012   Influenza Whole 07/05/2007   Influenza,inj,Quad  PF,6+ Mos 07/26/2014, 05/30/2015, 06/21/2016, 07/10/2018, 06/08/2022   Influenza-Unspecified 06/20/2017, 07/04/2020, 06/15/2021   PFIZER(Purple Top)SARS-COV-2 Vaccination 11/30/2019, 12/22/2019, 07/17/2020, 02/09/2021   PNEUMOCOCCAL CONJUGATE-20 02/19/2021   Pfizer Covid-19 Vaccine Bivalent Booster 32yrs & up 07/19/2022   Tdap 12/14/2012, 01/10/2023   Zoster Recombinant(Shingrix) 04/10/2018, 10/20/2018     Objective: Vital Signs: There were no vitals taken for this visit.   Physical Exam Vitals and nursing note reviewed.  Constitutional:      Appearance: She  is well-developed.  HENT:     Head: Normocephalic and atraumatic.   Eyes:     Conjunctiva/sclera: Conjunctivae normal.    Cardiovascular:     Rate and Rhythm: Normal rate and regular rhythm.     Heart sounds: Normal heart sounds.  Pulmonary:     Effort: Pulmonary effort is normal.     Breath sounds: Normal breath sounds.  Abdominal:     General: Bowel sounds are normal.     Palpations: Abdomen is soft.   Musculoskeletal:     Cervical back: Normal range of motion.  Lymphadenopathy:     Cervical: No cervical adenopathy.   Skin:    General: Skin is warm and dry.     Capillary Refill: Capillary refill takes less than 2 seconds.   Neurological:     Mental Status: She is alert and oriented to person, place, and time.   Psychiatric:        Behavior: Behavior normal.      Musculoskeletal Exam: ***  CDAI Exam: CDAI Score: -- Patient Global: --; Provider Global: -- Swollen: --; Tender: -- Joint Exam 03/20/2024   No joint exam has been documented for this visit   There is currently no information documented on the homunculus. Go to the Rheumatology activity and complete the homunculus joint exam.  Investigation: No additional findings.  Imaging: No results found.  Recent Labs: Lab Results  Component Value Date   WBC 8.3 02/06/2024   HGB 14.2 02/06/2024   PLT 288 02/06/2024   NA 137 02/06/2024   K 4.6 02/06/2024   CL 102 02/06/2024   CO2 27 02/06/2024   GLUCOSE 79 02/06/2024   BUN 15 02/06/2024   CREATININE 1.05 02/06/2024   BILITOT 0.4 02/06/2024   ALKPHOS 82 01/10/2023   AST 23 02/06/2024   ALT 33 (H) 02/06/2024   PROT 6.7 02/06/2024   ALBUMIN 4.4 01/10/2023   CALCIUM  9.1 02/06/2024   GFRAA 79 03/11/2021   QFTBGOLDPLUS NEGATIVE 02/06/2024    Speciality Comments: MTX-evaded creatinine, leflunomide -nausea and abdominal discomfort, Humira  - started September 2022  Procedures:  No procedures performed Allergies: Arava  [leflunomide ],  Hydrocodone-acetaminophen, and Tramadol   Assessment / Plan:     Visit Diagnoses: Seronegative rheumatoid arthritis (HCC)  High risk medication use  Vitamin D  deficiency  History of osteoporosis  Episcleritis of both eyes  Acute pain of both shoulders  Primary osteoarthritis of both hands  Dupuytren's contracture of right hand  Status post left partial knee replacement  Primary osteoarthritis of both feet  Idiopathic scoliosis of thoracolumbar region  Nodule of lower lobe of lung  Depression with anxiety  Dyslipidemia  Environmental and seasonal allergies  Orders: No orders of the defined types were placed in this encounter.  No orders of the defined types were placed in this encounter.   Face-to-face time spent with patient was *** minutes. Greater than 50% of time was spent in counseling and coordination of care.  Follow-Up Instructions: No follow-ups on file.   Waddell HERO  Cheryl, PA-C  Note - This record has been created using [employer].  Chart creation errors have been sought, but may not always  have been located. Such creation errors do not reflect on  the standard of medical care.

## 2024-03-19 ENCOUNTER — Other Ambulatory Visit: Payer: Self-pay | Admitting: Pharmacy Technician

## 2024-03-19 ENCOUNTER — Other Ambulatory Visit: Payer: Self-pay

## 2024-03-19 NOTE — Progress Notes (Signed)
.  Specialty Pharmacy Refill Coordination Note  Sarah Ford is a 63 y.o. female contacted today regarding refills of specialty medication(s) Adalimumab  (Humira  (2 Pen))   Patient requested Delivery   Delivery date: 03/27/24   Verified address: [address]   [place]  [address]   Medication will be filled on 03/26/24.

## 2024-03-20 ENCOUNTER — Ambulatory Visit: Admitting: Physician Assistant

## 2024-03-20 DIAGNOSIS — M4125 Other idiopathic scoliosis, thoracolumbar region: Secondary | ICD-10-CM

## 2024-03-20 DIAGNOSIS — M25511 Pain in right shoulder: Secondary | ICD-10-CM

## 2024-03-20 DIAGNOSIS — M0600 Rheumatoid arthritis without rheumatoid factor, unspecified site: Secondary | ICD-10-CM

## 2024-03-20 DIAGNOSIS — Z79899 Other long term (current) drug therapy: Secondary | ICD-10-CM

## 2024-03-20 DIAGNOSIS — M19071 Primary osteoarthritis, right ankle and foot: Secondary | ICD-10-CM

## 2024-03-20 DIAGNOSIS — E785 Hyperlipidemia, unspecified: Secondary | ICD-10-CM

## 2024-03-20 DIAGNOSIS — J3089 Other allergic rhinitis: Secondary | ICD-10-CM

## 2024-03-20 DIAGNOSIS — M19041 Primary osteoarthritis, right hand: Secondary | ICD-10-CM

## 2024-03-20 DIAGNOSIS — E559 Vitamin D deficiency, unspecified: Secondary | ICD-10-CM

## 2024-03-20 DIAGNOSIS — Z8739 Personal history of other diseases of the musculoskeletal system and connective tissue: Secondary | ICD-10-CM

## 2024-03-20 DIAGNOSIS — F418 Other specified anxiety disorders: Secondary | ICD-10-CM

## 2024-03-20 DIAGNOSIS — H15103 Unspecified episcleritis, bilateral: Secondary | ICD-10-CM

## 2024-03-20 DIAGNOSIS — R911 Solitary pulmonary nodule: Secondary | ICD-10-CM

## 2024-03-20 DIAGNOSIS — M720 Palmar fascial fibromatosis [Dupuytren]: Secondary | ICD-10-CM

## 2024-03-20 DIAGNOSIS — Z96652 Presence of left artificial knee joint: Secondary | ICD-10-CM

## 2024-03-20 NOTE — Progress Notes (Unsigned)
 Office Visit Note  Patient: Sarah Ford             Date of Birth: Aug 14, 1961           MRN: [id number]             PCP: Johnny Garnette LABOR, MD Referring: Johnny Garnette LABOR, MD Visit Date: 03/21/2024 Occupation: @GUAROCC @  Subjective:  No chief complaint on file.   History of Present Illness: Anielle Headrick is a 63 y.o. female with history of seronegative rheumatoid arthritis.  Patient remains on Humira  40 mg sq injections every 14 days.    CBC and CMP updated on 02/06/24. Her next lab work will be due in August and every 3 months.  TB gold negative on 02/06/24.   Discussed the importance of holding humira  if she develops signs or symptoms of an infection and to resume once the infection has completely cleared.      Activities of Daily Living:  Patient reports morning stiffness for *** {minute/hour:19697}.   Patient {ACTIONS;DENIES/REPORTS:21021675::Denies} nocturnal pain.  Difficulty dressing/grooming: {ACTIONS;DENIES/REPORTS:21021675::Denies} Difficulty climbing stairs: {ACTIONS;DENIES/REPORTS:21021675::Denies} Difficulty getting out of chair: {ACTIONS;DENIES/REPORTS:21021675::Denies} Difficulty using hands for taps, buttons, cutlery, and/or writing: {ACTIONS;DENIES/REPORTS:21021675::Denies}  No Rheumatology ROS completed.   PMFS History:  Patient Active Problem List   Diagnosis Date Noted   Hypothyroidism 10/13/2023   Seronegative rheumatoid arthritis (HCC) 06/26/2021   Pulmonary nodules 03/05/2021   Arthralgia 12/10/2020   Peripheral edema 12/10/2020   Chronic right-sided low back pain with right-sided sciatica 01/25/2020   Constipation 10/24/2019   Environmental and seasonal allergies 10/24/2019   Migraine variant 03/09/2010   APHASIA 03/09/2010   INSOMNIA, CHRONIC 05/29/2007   Depression with anxiety 05/29/2007   Osteoporosis 05/29/2007    Past Medical History:  Diagnosis Date   Allergy    Arthritis    Constipation    Depression    IBS (irritable bowel  syndrome)    sees Dr. Luis    Insomnia    Osteoporosis    gets Reclast  per [facility]    Routine gynecological examination    sees Dr. Marget    Seronegative rheumatoid arthritis [facility])    sees Dr. Dolphus    Family History  Problem Relation Age of Onset   Cancer Mother    Breast cancer Mother    Thyroid  disease Mother    Hypertension Mother    High Cholesterol Mother    Myelodysplastic syndrome Maternal Aunt    Heart failure Father    Kidney failure Father    Diabetes Father    Hypertension Father    High Cholesterol Father    Osteoporosis Sister    Alcohol abuse Sister    Mental illness Sister    Healthy Son    Allergies Daughter    Depression Daughter    Past Surgical History:  Procedure Laterality Date   COLONOSCOPY  08/28/2014   per Dr. Luis, clear, repeat in 10 yrs    DILATION AND CURETTAGE OF UTERUS     x2   FOOT SURGERY Right    bone spur   HERNIA REPAIR     umbilical   MEDIAL PARTIAL KNEE REPLACEMENT Left 11/2021   TEMPOROMANDIBULAR JOINT SURGERY  1993   tummy tuck     dr marcus   Social History   Social History Narrative   Not on file   Immunization History  Administered Date(s) Administered   Influenza Inj Mdck Quad With Preservative 06/12/2019   Influenza Split 07/26/2011, 07/05/2012   Influenza Whole  07/05/2007   Influenza,inj,Quad PF,6+ Mos 07/26/2014, 05/30/2015, 06/21/2016, 07/10/2018, 06/08/2022   Influenza-Unspecified 06/20/2017, 07/04/2020, 06/15/2021   PFIZER(Purple Top)SARS-COV-2 Vaccination 11/30/2019, 12/22/2019, 07/17/2020, 02/09/2021   PNEUMOCOCCAL CONJUGATE-20 02/19/2021   Pfizer Covid-19 Vaccine Bivalent Booster 53yrs & up 07/19/2022   Tdap 12/14/2012, 01/10/2023   Zoster Recombinant(Shingrix) 04/10/2018, 10/20/2018     Objective: Vital Signs: There were no vitals taken for this visit.   Physical Exam Vitals and nursing note reviewed.  Constitutional:      Appearance: She is well-developed.  HENT:      Head: Normocephalic and atraumatic.   Eyes:     Conjunctiva/sclera: Conjunctivae normal.    Cardiovascular:     Rate and Rhythm: Normal rate and regular rhythm.     Heart sounds: Normal heart sounds.  Pulmonary:     Effort: Pulmonary effort is normal.     Breath sounds: Normal breath sounds.  Abdominal:     General: Bowel sounds are normal.     Palpations: Abdomen is soft.   Musculoskeletal:     Cervical back: Normal range of motion.  Lymphadenopathy:     Cervical: No cervical adenopathy.   Skin:    General: Skin is warm and dry.     Capillary Refill: Capillary refill takes less than 2 seconds.   Neurological:     Mental Status: She is alert and oriented to person, place, and time.   Psychiatric:        Behavior: Behavior normal.      Musculoskeletal Exam: ***  CDAI Exam: CDAI Score: -- Patient Global: --; Provider Global: -- Swollen: --; Tender: -- Joint Exam 03/21/2024   No joint exam has been documented for this visit   There is currently no information documented on the homunculus. Go to the Rheumatology activity and complete the homunculus joint exam.  Investigation: No additional findings.  Imaging: No results found.  Recent Labs: Lab Results  Component Value Date   WBC 8.3 02/06/2024   HGB 14.2 02/06/2024   PLT 288 02/06/2024   NA 137 02/06/2024   K 4.6 02/06/2024   CL 102 02/06/2024   CO2 27 02/06/2024   GLUCOSE 79 02/06/2024   BUN 15 02/06/2024   CREATININE 1.05 02/06/2024   BILITOT 0.4 02/06/2024   ALKPHOS 82 01/10/2023   AST 23 02/06/2024   ALT 33 (H) 02/06/2024   PROT 6.7 02/06/2024   ALBUMIN 4.4 01/10/2023   CALCIUM  9.1 02/06/2024   GFRAA 79 03/11/2021   QFTBGOLDPLUS NEGATIVE 02/06/2024    Speciality Comments: MTX-evaded creatinine, leflunomide -nausea and abdominal discomfort, Humira  - started September 2022  Procedures:  No procedures performed Allergies: Arava  [leflunomide ], Hydrocodone-acetaminophen, and Tramadol    Assessment / Plan:     Visit Diagnoses: Seronegative rheumatoid arthritis (HCC)  High risk medication use  Episcleritis of both eyes  Acute pain of both shoulders  Dupuytren's contracture of right hand  Primary osteoarthritis of both hands  Status post left partial knee replacement  History of osteoporosis  Vitamin D  deficiency  Orders: No orders of the defined types were placed in this encounter.  No orders of the defined types were placed in this encounter.   Face-to-face time spent with patient was *** minutes. Greater than 50% of time was spent in counseling and coordination of care.  Follow-Up Instructions: No follow-ups on file.   Waddell CHRISTELLA Craze, PA-C  Note - This record has been created using Dragon software.  Chart creation errors have been sought, but may not always  have  been located. Such creation errors do not reflect on  the standard of medical care.

## 2024-03-21 ENCOUNTER — Encounter: Payer: Self-pay | Admitting: Physician Assistant

## 2024-03-21 ENCOUNTER — Ambulatory Visit: Attending: Physician Assistant | Admitting: Physician Assistant

## 2024-03-21 ENCOUNTER — Other Ambulatory Visit: Payer: Self-pay | Admitting: Family Medicine

## 2024-03-21 VITALS — BP 118/80 | HR 69 | Resp 16 | Ht 62.5 in | Wt 155.6 lb

## 2024-03-21 DIAGNOSIS — Z96652 Presence of left artificial knee joint: Secondary | ICD-10-CM

## 2024-03-21 DIAGNOSIS — H15103 Unspecified episcleritis, bilateral: Secondary | ICD-10-CM | POA: Diagnosis not present

## 2024-03-21 DIAGNOSIS — Z8739 Personal history of other diseases of the musculoskeletal system and connective tissue: Secondary | ICD-10-CM

## 2024-03-21 DIAGNOSIS — Z79899 Other long term (current) drug therapy: Secondary | ICD-10-CM | POA: Diagnosis not present

## 2024-03-21 DIAGNOSIS — M25512 Pain in left shoulder: Secondary | ICD-10-CM

## 2024-03-21 DIAGNOSIS — M0600 Rheumatoid arthritis without rheumatoid factor, unspecified site: Secondary | ICD-10-CM

## 2024-03-21 DIAGNOSIS — M19041 Primary osteoarthritis, right hand: Secondary | ICD-10-CM

## 2024-03-21 DIAGNOSIS — M720 Palmar fascial fibromatosis [Dupuytren]: Secondary | ICD-10-CM

## 2024-03-21 DIAGNOSIS — E559 Vitamin D deficiency, unspecified: Secondary | ICD-10-CM

## 2024-03-21 DIAGNOSIS — M19042 Primary osteoarthritis, left hand: Secondary | ICD-10-CM

## 2024-03-21 DIAGNOSIS — M25511 Pain in right shoulder: Secondary | ICD-10-CM | POA: Diagnosis not present

## 2024-03-21 MED ORDER — METHOCARBAMOL 500 MG PO TABS: 500.0000 mg | ORAL_TABLET | Freq: Every day | ORAL | 0 refills | Status: DC | PRN

## 2024-03-21 NOTE — Patient Instructions (Addendum)
 Integrative therapies     Standing Labs We placed an order today for your standing lab work.   Please have your standing labs drawn in mid-August and every 3 months   Please have your labs drawn 2 weeks prior to your appointment so that the provider can discuss your lab results at your appointment, if possible.  Please note that you may see your imaging and lab results in MyChart before we have reviewed them. We will contact you once all results are reviewed. Please allow our office up to 72 hours to thoroughly review all of the results before contacting the office for clarification of your results.  WALK-IN LAB HOURS  Monday through Thursday from 8:00 am -12:30 pm and 1:00 pm-4:30 pm and Friday from 8:00 am-12:00 pm.  Patients with office visits requiring labs will be seen before walk-in labs.  You may encounter longer than normal wait times. Please allow additional time. Wait times may be shorter on  Monday and Thursday afternoons.  We do not book appointments for walk-in labs. We appreciate your patience and understanding with our staff.   Labs are drawn by [facility]. Please bring your co-pay at the time of your lab draw.  You may receive a bill from [facility] for your lab work.  Please note if you are on Hydroxychloroquine and and an order has been placed for a Hydroxychloroquine level,  you will need to have it drawn 4 hours or more after your last dose.  If you wish to have your labs drawn at another location, please call the office 24 hours in advance so we can fax the orders.  The office is located at [address], [address], [place], [place] [address]   If you have any questions regarding directions or hours of operation,  please call [phone].   As a reminder, please drink plenty of water prior to coming for your lab work. Thanks!

## 2024-04-05 NOTE — Telephone Encounter (Signed)
 [facility]neaded energy or we can place a referral to integrative therapies

## 2024-04-20 ENCOUNTER — Encounter (INDEPENDENT_AMBULATORY_CARE_PROVIDER_SITE_OTHER): Payer: Self-pay

## 2024-04-20 ENCOUNTER — Other Ambulatory Visit: Payer: Self-pay

## 2024-04-20 NOTE — Progress Notes (Signed)
 Specialty Pharmacy Refill Coordination Note  Sarah Ford is a 63 y.o. female contacted today regarding refills of specialty medication(s) Adalimumab  (Humira  (2 Pen))   Patient requested (Patient-Rptd) Delivery   Delivery date: 04/25/24   Verified address: (Patient-Rptd) [address] [address]   Medication will be filled on 04/24/24.

## 2024-04-23 ENCOUNTER — Other Ambulatory Visit: Payer: Self-pay

## 2024-05-15 ENCOUNTER — Encounter (INDEPENDENT_AMBULATORY_CARE_PROVIDER_SITE_OTHER): Payer: Self-pay

## 2024-05-15 ENCOUNTER — Other Ambulatory Visit: Payer: Self-pay

## 2024-05-15 NOTE — Progress Notes (Signed)
 Specialty Pharmacy Refill Coordination Note  Sarah Ford is a 63 y.o. female contacted today regarding refills of specialty medication(s) Adalimumab  (Humira  (2 Pen))   Patient requested Delivery   Delivery date: 05/29/24   Verified address: [address]   Medication will be filled on 05/28/24.

## 2024-05-23 ENCOUNTER — Other Ambulatory Visit: Payer: Self-pay | Admitting: Family Medicine

## 2024-05-24 ENCOUNTER — Telehealth: Payer: Self-pay

## 2024-05-24 NOTE — Telephone Encounter (Signed)
 Submitted a Prior Authorization request to [facility] for HUMIRA  via CoverMyMeds. Will update once we receive a response.  Key: [id number]

## 2024-05-28 ENCOUNTER — Other Ambulatory Visit: Payer: Self-pay | Admitting: *Deleted

## 2024-05-28 DIAGNOSIS — Z79899 Other long term (current) drug therapy: Secondary | ICD-10-CM

## 2024-05-28 NOTE — Telephone Encounter (Signed)
 PA form for Humira  faxed to [facility]  Case #: [id number] Fax #: [phone] Phone #: [phone]

## 2024-05-29 NOTE — Telephone Encounter (Signed)
 Received notification from [facility] regarding a prior authorization for HUMIRA . Authorization has been APPROVED from 05/24/2024 to 05/24/2025. Approval letter sent to scan center.  Authorization # [id number]  Sherry Pennant, PharmD, MPH, BCPS, CPP Clinical Pharmacist [facility])

## 2024-06-06 ENCOUNTER — Other Ambulatory Visit: Payer: Self-pay

## 2024-06-11 ENCOUNTER — Encounter: Payer: Self-pay | Admitting: Family Medicine

## 2024-06-13 MED ORDER — LORAZEPAM 2 MG PO TABS: 4.0000 mg | ORAL_TABLET | Freq: Every day | ORAL | 5 refills | Status: AC

## 2024-06-13 NOTE — Telephone Encounter (Signed)
 Done

## 2024-06-18 ENCOUNTER — Other Ambulatory Visit: Payer: Self-pay | Admitting: Physician Assistant

## 2024-06-18 ENCOUNTER — Other Ambulatory Visit (HOSPITAL_COMMUNITY): Payer: Self-pay

## 2024-06-18 DIAGNOSIS — M0600 Rheumatoid arthritis without rheumatoid factor, unspecified site: Secondary | ICD-10-CM

## 2024-06-20 ENCOUNTER — Other Ambulatory Visit: Payer: Self-pay

## 2024-06-28 ENCOUNTER — Other Ambulatory Visit: Payer: Self-pay

## 2024-06-28 DIAGNOSIS — Z79899 Other long term (current) drug therapy: Secondary | ICD-10-CM

## 2024-06-28 MED ORDER — METHOCARBAMOL 500 MG PO TABS: 500.0000 mg | ORAL_TABLET | Freq: Every day | ORAL | 0 refills | Status: DC | PRN

## 2024-06-28 NOTE — Telephone Encounter (Signed)
 Last Fill: 03/21/2024  Next Visit: 08/21/2024  Last Visit: 03/21/2024  Dx: Seronegative rheumatoid arthritis   Current Dose per office note on 03/21/2024: dose not discussed  Okay to refill Methocarbamol ?

## 2024-07-02 ENCOUNTER — Other Ambulatory Visit: Payer: Self-pay

## 2024-07-03 ENCOUNTER — Ambulatory Visit: Payer: Self-pay | Admitting: Physician Assistant

## 2024-07-03 LAB — COMPREHENSIVE METABOLIC PANEL WITH GFR
AG Ratio: 1.7 (calc) (ref 1.0–2.5)
ALT: 25 U/L (ref 6–29)
AST: 23 U/L (ref 10–35)
Albumin: 4.3 g/dL (ref 3.6–5.1)
Alkaline phosphatase (APISO): 83 U/L (ref 37–153)
BUN: 11 mg/dL (ref 7–25)
CO2: 23 mmol/L (ref 20–32)
Calcium: 9.4 mg/dL (ref 8.6–10.4)
Chloride: 104 mmol/L (ref 98–110)
Creat: 1.04 mg/dL (ref 0.50–1.05)
Globulin: 2.6 g/dL (ref 1.9–3.7)
Glucose, Bld: 91 mg/dL (ref 65–139)
Potassium: 4.3 mmol/L (ref 3.5–5.3)
Sodium: 137 mmol/L (ref 135–146)
Total Bilirubin: 0.4 mg/dL (ref 0.2–1.2)
Total Protein: 6.9 g/dL (ref 6.1–8.1)
eGFR: 62 mL/min/1.73m2 (ref >=60)

## 2024-07-03 LAB — CBC WITH DIFFERENTIAL/PLATELET
Absolute Lymphocytes: 2374 {cells}/uL (ref 850–3900)
Absolute Monocytes: 531 {cells}/uL (ref 200–950)
Basophils Absolute: 48 {cells}/uL (ref 0–200)
Basophils Relative: 0.7 %
Eosinophils Absolute: 214 {cells}/uL (ref 15–500)
Eosinophils Relative: 3.1 %
HCT: 43 % (ref 35.0–45.0)
Hemoglobin: 14.3 g/dL (ref 11.7–15.5)
MCH: 30.6 pg (ref 27.0–33.0)
MCHC: 33.3 g/dL (ref 32.0–36.0)
MCV: 92.1 fL (ref 80.0–100.0)
MPV: 10.6 fL (ref 7.5–12.5)
Monocytes Relative: 7.7 %
Neutro Abs: 3733 {cells}/uL (ref 1500–7800)
Neutrophils Relative %: 54.1 %
Platelets: 263 Thousand/uL (ref 140–400)
RBC: 4.67 Million/uL (ref 3.80–5.10)
RDW: 11.9 % (ref 11.0–15.0)
Total Lymphocyte: 34.4 %
WBC: 6.9 Thousand/uL (ref 3.8–10.8)

## 2024-07-03 NOTE — Progress Notes (Signed)
 CBC and CMP WNL

## 2024-07-04 ENCOUNTER — Other Ambulatory Visit: Payer: Self-pay | Admitting: Physician Assistant

## 2024-07-04 ENCOUNTER — Other Ambulatory Visit: Payer: Self-pay

## 2024-07-04 ENCOUNTER — Other Ambulatory Visit (HOSPITAL_COMMUNITY): Payer: Self-pay

## 2024-07-04 DIAGNOSIS — M0600 Rheumatoid arthritis without rheumatoid factor, unspecified site: Secondary | ICD-10-CM

## 2024-07-04 MED ORDER — HUMIRA (2 PEN) 40 MG/0.4ML ~~LOC~~ AJKT
40.0000 mg | AUTO-INJECTOR | SUBCUTANEOUS | 2 refills | Status: DC
  Filled 2024-07-04 – 2024-07-06 (×3): qty 2, 28d supply, fill #0
  Filled 2024-07-30 – 2024-08-21 (×2): qty 2, 28d supply, fill #1
  Filled 2024-09-11: qty 2, 28d supply, fill #2

## 2024-07-04 NOTE — Telephone Encounter (Signed)
 Last Fill: 03/08/2024  Labs: 07/02/2024 CBC and CMP WNL   TB Gold: 02/06/2024   Next Visit: 08/21/2024  Last Visit: 03/21/2024  DX: Seronegative rheumatoid arthritis   Current Dose per office note 03/21/2024: Humira  40 mg sq injections once every 14 days   Okay to refill Humira ?

## 2024-07-05 ENCOUNTER — Other Ambulatory Visit (HOSPITAL_COMMUNITY): Payer: Self-pay

## 2024-07-05 ENCOUNTER — Encounter (INDEPENDENT_AMBULATORY_CARE_PROVIDER_SITE_OTHER): Payer: Self-pay

## 2024-07-06 ENCOUNTER — Other Ambulatory Visit: Payer: Self-pay | Admitting: Family Medicine

## 2024-07-06 ENCOUNTER — Other Ambulatory Visit: Payer: Self-pay

## 2024-07-06 NOTE — Progress Notes (Signed)
 Specialty Pharmacy Refill Coordination Note  Sarah Ford is a 63 y.o. female contacted today regarding refills of specialty medication(s) Adalimumab  (Humira  (2 Pen))   Patient requested (Patient-Rptd) Delivery   Delivery date: 07/10/24   Verified address: [address]   [place] [phone]   Medication will be filled on 07/09/24.

## 2024-07-09 ENCOUNTER — Encounter: Payer: Self-pay | Admitting: Family Medicine

## 2024-07-09 ENCOUNTER — Other Ambulatory Visit: Payer: Self-pay

## 2024-07-09 DIAGNOSIS — Z1211 Encounter for screening for malignant neoplasm of colon: Secondary | ICD-10-CM

## 2024-07-10 NOTE — Telephone Encounter (Signed)
 She should not need a referral since she has already had one colonoscopy

## 2024-07-12 ENCOUNTER — Encounter: Payer: Self-pay | Admitting: Family Medicine

## 2024-07-12 ENCOUNTER — Encounter: Payer: Self-pay | Admitting: *Deleted

## 2024-07-12 DIAGNOSIS — Z148 Genetic carrier of other disease: Secondary | ICD-10-CM | POA: Insufficient documentation

## 2024-07-12 NOTE — Telephone Encounter (Signed)
 I did the referral to [facility]

## 2024-07-12 NOTE — Telephone Encounter (Signed)
 Please see if carrier status for alpha 1 antitrypsin can be added to problem list

## 2024-07-12 NOTE — Telephone Encounter (Signed)
 Added to patient's problem list

## 2024-07-13 NOTE — Telephone Encounter (Signed)
This has been noted in her chart.

## 2024-07-23 NOTE — Telephone Encounter (Signed)
 ACR recommendations state to hold Humira  for a dosing cycle prior to surgical intervention.  She will require clearance by the orthopedic surgeon prior to resuming Humira  during the postoperative period.

## 2024-07-24 ENCOUNTER — Encounter: Payer: Self-pay | Admitting: Family Medicine

## 2024-07-24 NOTE — Telephone Encounter (Signed)
 Patient advised ACR recommendations state to hold Humira  for a dosing cycle prior to surgical intervention. She will require clearance by the orthopedic surgeon prior to resuming Humira  during the postoperative period. Patient verbalized understanding.

## 2024-07-25 MED ORDER — METHOCARBAMOL 500 MG PO TABS: 500.0000 mg | ORAL_TABLET | Freq: Four times a day (QID) | ORAL | 0 refills | Status: AC | PRN

## 2024-07-25 NOTE — Telephone Encounter (Signed)
 I sent in for Methocarbamol 

## 2024-07-30 ENCOUNTER — Other Ambulatory Visit: Payer: Self-pay

## 2024-08-02 LAB — HM MAMMOGRAPHY

## 2024-08-06 HISTORY — PX: KNEE ARTHROSCOPY: SUR90

## 2024-08-07 NOTE — Progress Notes (Unsigned)
 Office Visit Note  Patient: Sarah Ford             Date of Birth: 1961-08-28           MRN: [id number]             PCP: Johnny Garnette LABOR, MD Referring: Johnny Garnette LABOR, MD Visit Date: 08/21/2024 Occupation: Data Unavailable  Subjective:  Medication monitoring   History of Present Illness: Rolena Knutson is a 63 y.o. female with history of  seronegative rheumatoid arthritis. Patient is prescribed Humira  40 mg sq injections once every 14 days.  Patient reports that she has been holding Humira  due to undergoing left knee arthroscopic surgery on 08/06/2024 by Dr. Lewanda.  Patient states she has a postoperative visit tomorrow and is hoping to get clearance to resume Humira .  Patient states that she has been taking Aleve sparingly for symptomatic relief.  Patient states that she does have good range of motion of the left knee.  She denies any signs or symptoms of a rheumatoid arthritis flare while off of Humira .  Patient states that her feet pain has improved since wearing Hoka's.   She denies any recent or recurrent infections.   Activities of Daily Living:  Patient reports morning stiffness for 0 minute.   Patient Denies nocturnal pain.  Difficulty dressing/grooming: Denies Difficulty climbing stairs: Denies Difficulty getting out of chair: Denies Difficulty using hands for taps, buttons, cutlery, and/or writing: Denies  Review of Systems  Constitutional:  Positive for fatigue.  HENT:  Positive for mouth sores and mouth dryness.   Eyes:  Negative for dryness.  Respiratory:  Negative for shortness of breath.   Cardiovascular:  Negative for chest pain and palpitations.  Gastrointestinal:  Positive for constipation. Negative for blood in stool and diarrhea.  Endocrine: Positive for increased urination.  Genitourinary:  Negative for involuntary urination.  Musculoskeletal:  Positive for joint pain, joint pain, myalgias and myalgias. Negative for gait problem, joint swelling, muscle weakness,  morning stiffness and muscle tenderness.  Skin:  Negative for color change, rash, hair loss and sensitivity to sunlight.  Allergic/Immunologic: Negative for susceptible to infections.  Neurological:  Negative for dizziness and headaches.  Hematological:  Negative for swollen glands.  Psychiatric/Behavioral:  Positive for sleep disturbance. Negative for depressed mood. The patient is not nervous/anxious.     PMFS History:  Patient Active Problem List   Diagnosis Date Noted   Carrier of alpha-1-antitrypsin deficiency 07/12/2024   Hypothyroidism 10/13/2023   Seronegative rheumatoid arthritis (HCC) 06/26/2021   Pulmonary nodules 03/05/2021   Arthralgia 12/10/2020   Peripheral edema 12/10/2020   Chronic right-sided low back pain with right-sided sciatica 01/25/2020   Constipation 10/24/2019   Environmental and seasonal allergies 10/24/2019   Migraine variant 03/09/2010   APHASIA 03/09/2010   INSOMNIA, CHRONIC 05/29/2007   Depression with anxiety 05/29/2007   Osteoporosis 05/29/2007    Past Medical History:  Diagnosis Date   Allergy    Arthritis    Constipation    Depression    IBS (irritable bowel syndrome)    sees Dr. Luis    Insomnia    Osteoporosis    gets Reclast  per [facility]    Routine gynecological examination    sees Dr. Marget    Seronegative rheumatoid arthritis [facility])    sees Dr. Dolphus    Family History  Problem Relation Age of Onset   Cancer Mother    Breast cancer Mother    Thyroid  disease Mother  Hypertension Mother    High Cholesterol Mother    Heart attack Mother    Other Mother        Alpha 1 Antitrypsin Deficiency   Heart failure Father    Kidney failure Father    Diabetes Father    Hypertension Father    High Cholesterol Father    Osteoporosis Sister    Alcohol abuse Sister    Mental illness Sister    Allergies Daughter    Depression Daughter    Healthy Son    Myelodysplastic syndrome Maternal Aunt    Past Surgical History:   Procedure Laterality Date   COLONOSCOPY  08/28/2014   per Dr. Luis, clear, repeat in 10 yrs    DILATION AND CURETTAGE OF UTERUS     x2   FOOT SURGERY Right    bone spur   HERNIA REPAIR     umbilical   KNEE ARTHROSCOPY Left 08/06/2024   MEDIAL PARTIAL KNEE REPLACEMENT Left 11/2021   PRE-MALIGNANT / BENIGN SKIN LESION EXCISION Bilateral 02/2024   TEMPOROMANDIBULAR JOINT SURGERY  1993   tummy tuck     dr marcus   Social History   Tobacco Use   Smoking status: Former    Types: Cigarettes    Passive exposure: Past   Smokeless tobacco: Never   Tobacco comments:    3 cig bi-weekly  Vaping Use   Vaping status: Never Used  Substance Use Topics   Alcohol use: Not Currently   Drug use: No   Social History   Social History Narrative   Not on file     Immunization History  Administered Date(s) Administered   Influenza Inj Mdck Quad With Preservative 06/12/2019   Influenza Split 07/26/2011, 07/05/2012   Influenza Whole 07/05/2007   Influenza,inj,Quad PF,6+ Mos 07/26/2014, 05/30/2015, 06/21/2016, 07/10/2018, 06/08/2022   Influenza-Unspecified 06/20/2017, 07/04/2020, 06/15/2021   PFIZER(Purple Top)SARS-COV-2 Vaccination 11/30/2019, 12/22/2019, 07/17/2020, 02/09/2021   PNEUMOCOCCAL CONJUGATE-20 02/19/2021   Pfizer Covid-19 Vaccine Bivalent Booster 90yrs & up 07/19/2022   Tdap 12/14/2012, 01/10/2023   Zoster Recombinant(Shingrix) 04/10/2018, 10/20/2018     Objective: Vital Signs: BP 129/86   Pulse 77   Temp 98.5 F (36.9 C)   Resp 14   Ht 5' 2.5 (1.588 m)   Wt 154 lb 6.4 oz (70 kg)   BMI 27.79 kg/m    Physical Exam Vitals and nursing note reviewed.  Constitutional:      Appearance: She is well-developed.  HENT:     Head: Normocephalic and atraumatic.  Eyes:     Conjunctiva/sclera: Conjunctivae normal.  Cardiovascular:     Rate and Rhythm: Normal rate and regular rhythm.     Heart sounds: Normal heart sounds.  Pulmonary:     Effort: Pulmonary effort is  normal.     Breath sounds: Normal breath sounds.  Abdominal:     General: Bowel sounds are normal.     Palpations: Abdomen is soft.  Musculoskeletal:     Cervical back: Normal range of motion.  Lymphadenopathy:     Cervical: No cervical adenopathy.  Skin:    General: Skin is warm and dry.     Capillary Refill: Capillary refill takes less than 2 seconds.  Neurological:     Mental Status: She is alert and oriented to person, place, and time.  Psychiatric:        Behavior: Behavior normal.      Musculoskeletal Exam: C-spine, thoracic spine, lumbar spine have good range of motion.  No midline spinal tenderness.  No SI joint tenderness.  Shoulder joints, elbow joints, wrist joints, MCPs, PIPs, DIPs have good range of motion with no synovitis.  Complete fist formation bilaterally.  Hip joints have good range of motion with no groin pain.  Left knee partial replacement has good range of motion with no warmth or effusion.  Right knee joint has good range of motion no warmth or effusion.  Ankle joints have good range of motion no tenderness or joint swelling.    CDAI Exam: CDAI Score: -- Patient Global: --; Provider Global: -- Swollen: --; Tender: -- Joint Exam 08/21/2024   No joint exam has been documented for this visit   There is currently no information documented on the homunculus. Go to the Rheumatology activity and complete the homunculus joint exam.  Investigation: No additional findings.  Imaging: No results found.  Recent Labs: Lab Results  Component Value Date   WBC 6.9 07/02/2024   HGB 14.3 07/02/2024   PLT 263 07/02/2024   NA 137 07/02/2024   K 4.3 07/02/2024   CL 104 07/02/2024   CO2 23 07/02/2024   GLUCOSE 91 07/02/2024   BUN 11 07/02/2024   CREATININE 1.04 07/02/2024   BILITOT 0.4 07/02/2024   ALKPHOS 82 01/10/2023   AST 23 07/02/2024   ALT 25 07/02/2024   PROT 6.9 07/02/2024   ALBUMIN 4.4 01/10/2023   CALCIUM  9.4 07/02/2024   GFRAA 79 03/11/2021    QFTBGOLDPLUS NEGATIVE 02/06/2024    Speciality Comments: MTX-evaded creatinine, leflunomide -nausea and abdominal discomfort, Humira  - started September 2022  Procedures:  No procedures performed Allergies: Arava  [leflunomide ], Hydrocodone-acetaminophen, and Tramadol   Assessment / Plan:     Visit Diagnoses: Seronegative rheumatoid arthritis (HCC) - H/o recurrent inflammatory arthritis since February 2022, Positive synovitis, RF-, anti-CCP negative, 14-3-3 eta negative, h/o of episcleritis: She has no synovitis on examination today.  She has not had any signs or symptoms of a rheumatoid arthritis flare.  She has clinically been doing well on Humira  40 mg subcutaneous injections once every 14 days.  She is tolerating Humira  without any side effects or injection site reactions.  Patient has been holding Humira  due to undergoing left knee arthroscopic surgery on 08/06/2024 under the care of Dr. Rozelle.  Patient is scheduled to see Dr. Rozelle tomorrow for a postoperative visit at which time she is hoping to get clearance to resume Humira . She has not had any complications and her pain levels have been adequately controlled.  She takes Aleve sparingly for symptomatic relief. She was advised to notify us  if she develops any new or worsening symptoms. She will follow-up in the office in 5 months or sooner if needed.  High risk medication use - Humira  40 mg sq injections once every 14 days CBC and CMP updated on 07/02/24.  Her next lab work will be due in January and every 3 months  TB gold negative on 02/06/24 Discussed the importance of holding humira  if she develops signs or symptoms of an infection and to resume once the infection has completely cleared.   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  No recurrence since initiating Humira .  No signs or symptoms of a flare.  No conjunctival injection.  Dupuytren's contracture of right hand: mild third-unchanged.   Primary osteoarthritis of both  hands: She has PIP and DIP thickening consistent with osteoarthritis of both hands.  No synovitis noted. Complete fist formation bilaterally.   Status post left partial knee replacement - Dr. Rubie 11/25/21.  Chronic  pain.  Patient had a left knee joint aspiration and cortisone injection performed on 01/03/2024.   Patient underwent left knee arthroscopic surgery on 08/06/2024 by Dr. Lewanda.  Doing well. Taking aleve sparingly.  Good ROM with no warmth or effusion noted today.   Primary osteoarthritis of both feet: She has noticed an improvement in her feet pain since wearing Hoka's.   History of osteoporosis: Tx IV Reclast  (started September 2021) by [facility].  Past tx Fosamax, Actonel and Evista . DEXA  11/22/2019: AP spine T-score -3.6.IV Reclast  in 2021, 2022, 2024--most recent infusion was administered on 09/07/2023. No recent DEXA to review. She remains under the care of [facility]--reviewed virtual visit from 02/20/2024--planning to consider a fourth IV Reclast  infusion in June 2026.  Vitamin D  deficiency  Carrier of alpha-1-antitrypsin deficiency: Mother. Patient has requested further testing.   Other medical conditions are listed as follows:  Idiopathic scoliosis of thoracolumbar region  Nodule of lower lobe of lung  Depression with anxiety  Dyslipidemia  Environmental and seasonal allergies  Orders: Orders Placed This Encounter  Procedures   Alpha-1-antitrypsin   No orders of the defined types were placed in this encounter.    Follow-Up Instructions: Return in about 5 months (around 01/19/2025) for Rheumatoid arthritis, Osteoarthritis.   Waddell CHRISTELLA Craze, PA-C  Note - This record has been created using Dragon software.  Chart creation errors have been sought, but may not always  have been located. Such creation errors do not reflect on  the standard of medical care.

## 2024-08-13 ENCOUNTER — Encounter: Payer: Self-pay | Admitting: Family Medicine

## 2024-08-21 ENCOUNTER — Other Ambulatory Visit: Payer: Self-pay

## 2024-08-21 ENCOUNTER — Encounter: Payer: Self-pay | Admitting: Physician Assistant

## 2024-08-21 ENCOUNTER — Ambulatory Visit: Attending: Physician Assistant | Admitting: Physician Assistant

## 2024-08-21 ENCOUNTER — Other Ambulatory Visit (HOSPITAL_COMMUNITY): Payer: Self-pay

## 2024-08-21 ENCOUNTER — Ambulatory Visit: Admitting: Family Medicine

## 2024-08-21 VITALS — BP 129/86 | HR 77 | Temp 98.5°F | Resp 14 | Ht 62.5 in | Wt 154.4 lb

## 2024-08-21 DIAGNOSIS — H15103 Unspecified episcleritis, bilateral: Secondary | ICD-10-CM | POA: Diagnosis not present

## 2024-08-21 DIAGNOSIS — Z79899 Other long term (current) drug therapy: Secondary | ICD-10-CM

## 2024-08-21 DIAGNOSIS — M19041 Primary osteoarthritis, right hand: Secondary | ICD-10-CM

## 2024-08-21 DIAGNOSIS — M0600 Rheumatoid arthritis without rheumatoid factor, unspecified site: Secondary | ICD-10-CM | POA: Diagnosis not present

## 2024-08-21 DIAGNOSIS — R911 Solitary pulmonary nodule: Secondary | ICD-10-CM

## 2024-08-21 DIAGNOSIS — Z96652 Presence of left artificial knee joint: Secondary | ICD-10-CM

## 2024-08-21 DIAGNOSIS — F418 Other specified anxiety disorders: Secondary | ICD-10-CM

## 2024-08-21 DIAGNOSIS — M25511 Pain in right shoulder: Secondary | ICD-10-CM

## 2024-08-21 DIAGNOSIS — Z8739 Personal history of other diseases of the musculoskeletal system and connective tissue: Secondary | ICD-10-CM

## 2024-08-21 DIAGNOSIS — M19072 Primary osteoarthritis, left ankle and foot: Secondary | ICD-10-CM

## 2024-08-21 DIAGNOSIS — E785 Hyperlipidemia, unspecified: Secondary | ICD-10-CM

## 2024-08-21 DIAGNOSIS — M25512 Pain in left shoulder: Secondary | ICD-10-CM

## 2024-08-21 DIAGNOSIS — M19071 Primary osteoarthritis, right ankle and foot: Secondary | ICD-10-CM

## 2024-08-21 DIAGNOSIS — M19042 Primary osteoarthritis, left hand: Secondary | ICD-10-CM

## 2024-08-21 DIAGNOSIS — M720 Palmar fascial fibromatosis [Dupuytren]: Secondary | ICD-10-CM

## 2024-08-21 DIAGNOSIS — E559 Vitamin D deficiency, unspecified: Secondary | ICD-10-CM

## 2024-08-21 DIAGNOSIS — Z148 Genetic carrier of other disease: Secondary | ICD-10-CM

## 2024-08-21 DIAGNOSIS — J3089 Other allergic rhinitis: Secondary | ICD-10-CM

## 2024-08-21 DIAGNOSIS — M4125 Other idiopathic scoliosis, thoracolumbar region: Secondary | ICD-10-CM

## 2024-08-21 NOTE — Progress Notes (Signed)
 Specialty Pharmacy Refill Coordination Note  Sarah Ford is a 63 y.o. female contacted today regarding refills of specialty medication(s) Adalimumab  (Humira  (2 Pen))   Patient requested Delivery   Delivery date: 08/23/24   Verified address: [address]   [name] [address]   Medication will be filled on: 08/22/24

## 2024-08-21 NOTE — Addendum Note (Signed)
 Addended by: CHERYL WADDELL HERO on: 08/21/2024 04:31 PM   Modules accepted: Orders

## 2024-08-22 ENCOUNTER — Other Ambulatory Visit: Payer: Self-pay

## 2024-08-30 ENCOUNTER — Encounter: Payer: Self-pay | Admitting: Family Medicine

## 2024-08-30 NOTE — Telephone Encounter (Signed)
 Informed patient she has never had a physical here and we can get her scheduled whenever she is ready. Patient stated she will check schedule and call us  back to get that scheduled.

## 2024-09-03 ENCOUNTER — Encounter (HOSPITAL_COMMUNITY): Payer: Self-pay

## 2024-09-04 ENCOUNTER — Other Ambulatory Visit: Payer: Self-pay

## 2024-09-11 ENCOUNTER — Other Ambulatory Visit: Payer: Self-pay

## 2024-09-11 NOTE — Progress Notes (Signed)
 Specialty Pharmacy Refill Coordination Note  Nisreen Guise is a 63 y.o. female contacted today regarding refills of specialty medication(s) Adalimumab  (Humira  (2 Pen))   Patient requested Delivery   Delivery date: 09/18/24   Verified address: [address]   [place] [id number]   Medication will be filled on: 09/17/24

## 2024-09-11 NOTE — Progress Notes (Signed)
 Specialty Pharmacy Ongoing Clinical Assessment Note  Sarah Ford is a 63 y.o. female who is being followed by the specialty pharmacy service for RxSp Rheumatoid Arthritis   Patient's specialty medication(s) reviewed today: Adalimumab  (Humira  (2 Pen))   Missed doses in the last 4 weeks: 0   Patient/Caregiver did not have any additional questions or concerns.   Therapeutic benefit summary: Patient is achieving benefit   Adverse events/side effects summary: No adverse events/side effects   Patient's therapy is appropriate to: Continue    Goals Addressed             This Visit's Progress    Reduce signs and symptoms   On track    Patient is on track. Patient will maintain adherence         Follow up: 12 months  [name] Specialty Pharmacist

## 2024-09-17 ENCOUNTER — Other Ambulatory Visit (HOSPITAL_COMMUNITY): Payer: Self-pay

## 2024-09-17 ENCOUNTER — Other Ambulatory Visit: Payer: Self-pay

## 2024-09-26 NOTE — Telephone Encounter (Signed)
 I am not familiar with this particular supplement but I do not see a reason why she cannot try it.

## 2024-10-01 ENCOUNTER — Telehealth: Payer: Self-pay

## 2024-10-01 NOTE — Telephone Encounter (Signed)
 Copied from CRM #[id number]. Topic: Clinical - Medical Advice >> Oct 01, 2024  3:18 PM China J wrote: Reason for CRM: The patient has been concerned about her blood pressure. She usually has low blood pressure but saw that the diastolic number was at 80 which is usually higher than usual.  She would like to get Dr. Mira thoughts on this is possible. Please call patient at [phone].

## 2024-10-01 NOTE — Telephone Encounter (Signed)
 We consider high BP to be a top number over 140 or a bottom number over 90. She should not worry about a diastolic pressure of 80.

## 2024-10-04 ENCOUNTER — Other Ambulatory Visit: Payer: Self-pay | Admitting: *Deleted

## 2024-10-04 ENCOUNTER — Other Ambulatory Visit: Payer: Self-pay | Admitting: Family Medicine

## 2024-10-04 DIAGNOSIS — Z148 Genetic carrier of other disease: Secondary | ICD-10-CM

## 2024-10-04 DIAGNOSIS — Z79899 Other long term (current) drug therapy: Secondary | ICD-10-CM

## 2024-10-04 NOTE — Telephone Encounter (Signed)
 It is okay to switch manufacturers

## 2024-10-07 ENCOUNTER — Ambulatory Visit: Payer: Self-pay | Admitting: Rheumatology

## 2024-10-07 NOTE — Progress Notes (Signed)
CBC and CMP are normal.  Glucose is mildly elevated probably not a fasting sample.

## 2024-10-08 ENCOUNTER — Other Ambulatory Visit: Payer: Self-pay | Admitting: *Deleted

## 2024-10-08 ENCOUNTER — Other Ambulatory Visit: Payer: Self-pay

## 2024-10-08 ENCOUNTER — Other Ambulatory Visit: Payer: Self-pay | Admitting: Physician Assistant

## 2024-10-08 DIAGNOSIS — M19041 Primary osteoarthritis, right hand: Secondary | ICD-10-CM

## 2024-10-08 DIAGNOSIS — M19071 Primary osteoarthritis, right ankle and foot: Secondary | ICD-10-CM

## 2024-10-08 DIAGNOSIS — M25572 Pain in left ankle and joints of left foot: Secondary | ICD-10-CM

## 2024-10-08 DIAGNOSIS — M0600 Rheumatoid arthritis without rheumatoid factor, unspecified site: Secondary | ICD-10-CM

## 2024-10-08 DIAGNOSIS — G8929 Other chronic pain: Secondary | ICD-10-CM

## 2024-10-08 LAB — COMPREHENSIVE METABOLIC PANEL WITH GFR
AG Ratio: 1.7 (calc) (ref 1.0–2.5)
ALT: 21 U/L (ref 6–29)
AST: 21 U/L (ref 10–35)
Albumin: 4.1 g/dL (ref 3.6–5.1)
Alkaline phosphatase (APISO): 84 U/L (ref 37–153)
BUN: 12 mg/dL (ref 7–25)
CO2: 27 mmol/L (ref 20–32)
Calcium: 9 mg/dL (ref 8.6–10.4)
Chloride: 105 mmol/L (ref 98–110)
Creat: 0.88 mg/dL (ref 0.50–1.05)
Globulin: 2.4 g/dL (ref 1.9–3.7)
Glucose, Bld: 126 mg/dL — ABNORMAL HIGH (ref 65–99)
Potassium: 3.9 mmol/L (ref 3.5–5.3)
Sodium: 138 mmol/L (ref 135–146)
Total Bilirubin: 0.5 mg/dL (ref 0.2–1.2)
Total Protein: 6.5 g/dL (ref 6.1–8.1)
eGFR: 75 mL/min/1.73m2

## 2024-10-08 LAB — CBC WITH DIFFERENTIAL/PLATELET
Absolute Lymphocytes: 1922 {cells}/uL (ref 850–3900)
Absolute Monocytes: 561 {cells}/uL (ref 200–950)
Basophils Absolute: 49 {cells}/uL (ref 0–200)
Basophils Relative: 0.8 %
Eosinophils Absolute: 311 {cells}/uL (ref 15–500)
Eosinophils Relative: 5.1 %
HCT: 40.6 % (ref 35.9–46.0)
Hemoglobin: 13.5 g/dL (ref 11.7–15.5)
MCH: 29.7 pg (ref 27.0–33.0)
MCHC: 33.3 g/dL (ref 31.6–35.4)
MCV: 89.4 fL (ref 81.4–101.7)
MPV: 11 fL (ref 7.5–12.5)
Monocytes Relative: 9.2 %
Neutro Abs: 3257 {cells}/uL (ref 1500–7800)
Neutrophils Relative %: 53.4 %
Platelets: 242 Thousand/uL (ref 140–400)
RBC: 4.54 Million/uL (ref 3.80–5.10)
RDW: 12.3 % (ref 11.0–15.0)
Total Lymphocyte: 31.5 %
WBC: 6.1 Thousand/uL (ref 3.8–10.8)

## 2024-10-08 LAB — ALPHA-1-ANTITRYPSIN: A-1 Antitrypsin, Ser: 131 mg/dL (ref 83–199)

## 2024-10-08 NOTE — Telephone Encounter (Signed)
 Yes

## 2024-10-08 NOTE — Telephone Encounter (Signed)
 Okay to place referral to kneaded energy? Patient states she called and the referral is needed since she has arthritis.

## 2024-10-08 NOTE — Telephone Encounter (Signed)
 Last Fill: 07/04/2024  Labs: 10/05/2024 CBC and CMP are normal. Glucose is mildly elevated probably not a fasting sample.   TB Gold: 02/06/2024 Neg    Next Visit: 01/23/2025  Last Visit: 08/21/2024  IK:Dzmnwzhjupcz rheumatoid arthritis   Current Dose per office note 08/21/2024: Humira  40 mg sq injections once every 14 days   Okay to refill Humira ?

## 2024-10-08 NOTE — Telephone Encounter (Signed)
 Massage therapy does not require referral.  Many patients like kneaded energy.

## 2024-10-09 ENCOUNTER — Other Ambulatory Visit: Payer: Self-pay

## 2024-10-09 MED ORDER — HUMIRA (2 PEN) 40 MG/0.4ML ~~LOC~~ AJKT
40.0000 mg | AUTO-INJECTOR | SUBCUTANEOUS | 2 refills | Status: AC
  Filled 2024-10-09 – 2024-10-10 (×2): qty 0.8, 28d supply, fill #0

## 2024-10-09 NOTE — Progress Notes (Signed)
 Alpha-1 antitrypsin WNL-please notify the patient.

## 2024-10-10 ENCOUNTER — Other Ambulatory Visit: Payer: Self-pay

## 2024-10-12 ENCOUNTER — Other Ambulatory Visit: Payer: Self-pay | Admitting: Pharmacy Technician

## 2024-10-12 ENCOUNTER — Other Ambulatory Visit: Payer: Self-pay

## 2024-10-12 NOTE — Progress Notes (Signed)
 Specialty Pharmacy Refill Coordination Note  Sarah Ford is a 64 y.o. female contacted today regarding refills of specialty medication(s) Adalimumab  (Humira  (2 Pen))   Patient requested Delivery   Delivery date: 10/24/24   Verified address: [address]   [place] [phone]   Medication will be filled on: 10/23/24

## 2024-10-16 ENCOUNTER — Encounter: Payer: Self-pay | Admitting: Family Medicine

## 2024-10-17 MED ORDER — DIAZEPAM 10 MG PO TABS: 10.0000 mg | ORAL_TABLET | Freq: Two times a day (BID) | ORAL | 5 refills | Status: AC | PRN

## 2024-10-17 NOTE — Telephone Encounter (Signed)
 Done

## 2024-10-23 ENCOUNTER — Other Ambulatory Visit (HOSPITAL_BASED_OUTPATIENT_CLINIC_OR_DEPARTMENT_OTHER): Payer: Self-pay

## 2024-10-23 ENCOUNTER — Other Ambulatory Visit: Payer: Self-pay

## 2025-01-23 ENCOUNTER — Ambulatory Visit: Admitting: Rheumatology
